# Patient Record
Sex: Female | Born: 1960 | Race: Black or African American | Hispanic: No | Marital: Single | Smoking: Former smoker
Health system: Southern US, Community
[De-identification: ages and names within clinical notes are randomized; demographics above are authoritative.]

## PROBLEM LIST (undated history)

## (undated) ENCOUNTER — Emergency Department: Admission: EM | Disposition: A | Discharge status: Home / Self Care

## (undated) DIAGNOSIS — K219 Gastro-esophageal reflux disease without esophagitis: Secondary | ICD-10-CM

## (undated) DIAGNOSIS — I714 Abdominal aortic aneurysm, without rupture, unspecified: Secondary | ICD-10-CM

## (undated) DIAGNOSIS — E559 Vitamin D deficiency, unspecified: Secondary | ICD-10-CM

## (undated) DIAGNOSIS — E785 Hyperlipidemia, unspecified: Secondary | ICD-10-CM

## (undated) DIAGNOSIS — L98499 Non-pressure chronic ulcer of skin of other sites with unspecified severity: Secondary | ICD-10-CM

## (undated) DIAGNOSIS — M069 Rheumatoid arthritis, unspecified: Secondary | ICD-10-CM

## (undated) DIAGNOSIS — I671 Cerebral aneurysm, nonruptured: Secondary | ICD-10-CM

## (undated) DIAGNOSIS — E041 Nontoxic single thyroid nodule: Secondary | ICD-10-CM

## (undated) DIAGNOSIS — I10 Essential (primary) hypertension: Secondary | ICD-10-CM

## (undated) DIAGNOSIS — E119 Type 2 diabetes mellitus without complications: Secondary | ICD-10-CM

## (undated) DIAGNOSIS — E0590 Thyrotoxicosis, unspecified without thyrotoxic crisis or storm: Secondary | ICD-10-CM

## (undated) DIAGNOSIS — L98439 Non-pressure chronic ulcer of abdomen with unspecified severity: Secondary | ICD-10-CM

## (undated) HISTORY — PX: COLONOSCOPY: SHX174

## (undated) HISTORY — DX: Hyperlipidemia, unspecified: E78.5

## (undated) HISTORY — PX: OTHER SURGICAL HISTORY: SHX169

## (undated) HISTORY — DX: Abdominal aortic aneurysm, without rupture, unspecified: I71.40

---

## 1997-09-23 DIAGNOSIS — I671 Cerebral aneurysm, nonruptured: Secondary | ICD-10-CM

## 1997-09-23 HISTORY — DX: Cerebral aneurysm, nonruptured: I67.1

## 1997-11-27 HISTORY — PX: BRAIN SURGERY: SHX531

## 2000-09-23 DIAGNOSIS — M069 Rheumatoid arthritis, unspecified: Secondary | ICD-10-CM

## 2000-09-23 HISTORY — DX: Rheumatoid arthritis, unspecified: M06.9

## 2003-09-24 HISTORY — PX: ABDOMINAL HYSTERECTOMY: SHX81

## 2004-07-22 ENCOUNTER — Emergency Department: Payer: Self-pay | Admitting: Emergency Medicine

## 2005-09-12 ENCOUNTER — Emergency Department: Payer: Self-pay | Admitting: Emergency Medicine

## 2005-11-06 ENCOUNTER — Emergency Department: Payer: Self-pay | Admitting: Emergency Medicine

## 2006-02-07 ENCOUNTER — Ambulatory Visit: Payer: Self-pay | Admitting: Nurse Practitioner

## 2006-03-07 ENCOUNTER — Ambulatory Visit: Payer: Self-pay | Admitting: Unknown Physician Specialty

## 2006-03-07 ENCOUNTER — Other Ambulatory Visit: Payer: Self-pay

## 2006-03-13 ENCOUNTER — Ambulatory Visit: Payer: Self-pay | Admitting: Unknown Physician Specialty

## 2006-05-28 ENCOUNTER — Ambulatory Visit: Payer: Self-pay | Admitting: Nurse Practitioner

## 2006-07-03 ENCOUNTER — Ambulatory Visit: Payer: Self-pay | Admitting: Pediatrics

## 2006-08-07 ENCOUNTER — Other Ambulatory Visit: Payer: Self-pay

## 2006-08-07 ENCOUNTER — Inpatient Hospital Stay: Payer: Self-pay | Admitting: Internal Medicine

## 2007-01-05 ENCOUNTER — Emergency Department: Payer: Self-pay | Admitting: Emergency Medicine

## 2007-04-02 ENCOUNTER — Emergency Department: Payer: Self-pay

## 2007-04-02 ENCOUNTER — Other Ambulatory Visit: Payer: Self-pay

## 2007-08-02 ENCOUNTER — Emergency Department: Payer: Self-pay | Admitting: Emergency Medicine

## 2007-10-02 ENCOUNTER — Ambulatory Visit: Payer: Self-pay | Admitting: General Surgery

## 2007-10-02 ENCOUNTER — Other Ambulatory Visit: Payer: Self-pay

## 2007-10-09 ENCOUNTER — Ambulatory Visit: Payer: Self-pay | Admitting: General Surgery

## 2007-12-05 ENCOUNTER — Emergency Department: Payer: Self-pay | Admitting: Emergency Medicine

## 2008-01-11 ENCOUNTER — Observation Stay: Payer: Self-pay | Admitting: Internal Medicine

## 2008-01-11 ENCOUNTER — Other Ambulatory Visit: Payer: Self-pay

## 2008-03-03 ENCOUNTER — Ambulatory Visit: Payer: Self-pay | Admitting: Physician Assistant

## 2008-03-15 ENCOUNTER — Ambulatory Visit: Payer: Self-pay | Admitting: Physician Assistant

## 2008-03-27 ENCOUNTER — Emergency Department: Payer: Self-pay | Admitting: Emergency Medicine

## 2008-09-13 ENCOUNTER — Ambulatory Visit: Payer: Self-pay | Admitting: Internal Medicine

## 2009-01-13 ENCOUNTER — Ambulatory Visit: Payer: Self-pay | Admitting: Internal Medicine

## 2009-01-18 ENCOUNTER — Ambulatory Visit: Payer: Self-pay | Admitting: Internal Medicine

## 2009-03-29 ENCOUNTER — Ambulatory Visit: Payer: Self-pay | Admitting: Unknown Physician Specialty

## 2009-04-03 ENCOUNTER — Ambulatory Visit: Payer: Self-pay | Admitting: Unknown Physician Specialty

## 2009-07-02 ENCOUNTER — Emergency Department: Payer: Self-pay | Admitting: Emergency Medicine

## 2009-07-06 ENCOUNTER — Ambulatory Visit: Payer: Self-pay | Admitting: Internal Medicine

## 2009-11-27 ENCOUNTER — Ambulatory Visit: Payer: Self-pay | Admitting: Unknown Physician Specialty

## 2010-12-22 ENCOUNTER — Inpatient Hospital Stay: Payer: Self-pay | Admitting: Internal Medicine

## 2011-02-13 ENCOUNTER — Ambulatory Visit: Payer: Self-pay | Admitting: Unknown Physician Specialty

## 2011-02-14 LAB — PATHOLOGY REPORT

## 2011-05-13 ENCOUNTER — Emergency Department: Payer: Self-pay | Admitting: *Deleted

## 2011-07-05 ENCOUNTER — Ambulatory Visit: Payer: Self-pay | Admitting: Internal Medicine

## 2011-10-11 LAB — HM COLONOSCOPY

## 2012-03-07 ENCOUNTER — Emergency Department: Payer: Self-pay | Admitting: Emergency Medicine

## 2012-03-08 ENCOUNTER — Emergency Department: Payer: Self-pay | Admitting: Emergency Medicine

## 2012-04-13 ENCOUNTER — Emergency Department: Payer: Self-pay | Admitting: Unknown Physician Specialty

## 2012-04-13 LAB — URINALYSIS, COMPLETE
Bacteria: NONE SEEN
Bilirubin,UR: NEGATIVE
Ketone: NEGATIVE
Leukocyte Esterase: NEGATIVE
Protein: NEGATIVE
RBC,UR: 10 /HPF (ref 0–5)
Squamous Epithelial: <1
WBC UR: 1 /HPF (ref 0–5)

## 2012-04-13 LAB — BASIC METABOLIC PANEL
Anion Gap: 5 — ABNORMAL LOW (ref 7–16)
BUN: 13 mg/dL (ref 7–18)
Calcium, Total: 9.6 mg/dL (ref 8.5–10.1)
Co2: 29 mmol/L (ref 21–32)
EGFR (African American): >60
EGFR (Non-African Amer.): >60
Glucose: 94 mg/dL (ref 65–99)
Osmolality: 279 (ref 275–301)
Potassium: 4.4 mmol/L (ref 3.5–5.1)
Sodium: 140 mmol/L (ref 136–145)

## 2012-04-13 LAB — CBC
HCT: 40.4 % (ref 35.0–47.0)
HGB: 12.9 g/dL (ref 12.0–16.0)
MCHC: 32 g/dL (ref 32.0–36.0)
MCV: 93 fL (ref 80–100)
RDW: 13.8 % (ref 11.5–14.5)

## 2013-07-05 ENCOUNTER — Emergency Department: Payer: Self-pay | Admitting: Emergency Medicine

## 2013-07-05 LAB — URINALYSIS, COMPLETE
Bilirubin,UR: NEGATIVE
Glucose,UR: NEGATIVE mg/dL (ref 0–75)
Leukocyte Esterase: NEGATIVE
Nitrite: NEGATIVE
Ph: 8 (ref 4.5–8.0)
RBC,UR: 4 /HPF (ref 0–5)
Squamous Epithelial: <1
WBC UR: <1 /HPF (ref 0–5)

## 2013-07-05 LAB — COMPREHENSIVE METABOLIC PANEL
Albumin: 4.1 g/dL (ref 3.4–5.0)
Alkaline Phosphatase: 108 U/L (ref 50–136)
BUN: 10 mg/dL (ref 7–18)
Calcium, Total: 9.4 mg/dL (ref 8.5–10.1)
Co2: 27 mmol/L (ref 21–32)
Potassium: 4.1 mmol/L (ref 3.5–5.1)
SGOT(AST): 20 U/L (ref 15–37)
SGPT (ALT): 12 U/L (ref 12–78)
Sodium: 135 mmol/L — ABNORMAL LOW (ref 136–145)

## 2013-07-05 LAB — CBC
HCT: 43.5 % (ref 35.0–47.0)
MCH: 30.5 pg (ref 26.0–34.0)
MCHC: 33.7 g/dL (ref 32.0–36.0)
Platelet: 295 10*3/uL (ref 150–440)
RDW: 13.6 % (ref 11.5–14.5)

## 2013-07-06 ENCOUNTER — Emergency Department: Payer: Self-pay | Admitting: Emergency Medicine

## 2013-07-08 ENCOUNTER — Emergency Department: Payer: Self-pay | Admitting: Emergency Medicine

## 2013-07-13 ENCOUNTER — Encounter (HOSPITAL_COMMUNITY): Payer: Self-pay | Admitting: Emergency Medicine

## 2013-07-13 ENCOUNTER — Emergency Department: Payer: Self-pay | Admitting: Emergency Medicine

## 2013-07-13 DIAGNOSIS — M546 Pain in thoracic spine: Secondary | ICD-10-CM | POA: Insufficient documentation

## 2013-07-13 DIAGNOSIS — I10 Essential (primary) hypertension: Secondary | ICD-10-CM | POA: Insufficient documentation

## 2013-07-13 DIAGNOSIS — M069 Rheumatoid arthritis, unspecified: Secondary | ICD-10-CM | POA: Insufficient documentation

## 2013-07-13 DIAGNOSIS — Z8679 Personal history of other diseases of the circulatory system: Secondary | ICD-10-CM | POA: Insufficient documentation

## 2013-07-13 NOTE — ED Notes (Signed)
Presents with one week of left sided upper back pain described as a toothache and worse with movement and touch, pain is affecting daily life despite use of muscle relaxers and pain medication. Seen at [facility] Monday and given CT, EKG,  and multiple labwork, everything was normal. Despite use of medications pain is worsening and isolated to one small spot in left upper back.  Pain medication used is tramadol.

## 2013-07-13 NOTE — ED Notes (Signed)
Nurse First Rounds : Unable to locate pt. at waiting area.

## 2013-07-14 ENCOUNTER — Emergency Department (HOSPITAL_COMMUNITY)
Admission: EM | Admit: 2013-07-14 | Discharge: 2013-07-14 | Discharge status: Against Medical Advice | Payer: Medicare Other | Attending: Emergency Medicine | Admitting: Emergency Medicine

## 2013-07-14 HISTORY — DX: Rheumatoid arthritis, unspecified: M06.9

## 2013-07-14 HISTORY — DX: Non-pressure chronic ulcer of skin of other sites with unspecified severity: L98.499

## 2013-07-14 HISTORY — DX: Essential (primary) hypertension: I10

## 2013-07-14 HISTORY — DX: Non-pressure chronic ulcer of abdomen with unspecified severity: L98.439

## 2013-07-14 HISTORY — DX: Cerebral aneurysm, nonruptured: I67.1

## 2013-07-14 LAB — CBC
HCT: 46.6 % (ref 35.0–47.0)
HGB: 15.7 g/dL (ref 12.0–16.0)
MCH: 30.6 pg (ref 26.0–34.0)
Platelet: 310 10*3/uL (ref 150–440)
WBC: 6.8 10*3/uL (ref 3.6–11.0)

## 2013-07-14 LAB — URINALYSIS, COMPLETE
Bilirubin,UR: NEGATIVE
Blood: NEGATIVE
Glucose,UR: NEGATIVE mg/dL (ref 0–75)
Hyaline Cast: 35
Nitrite: NEGATIVE
Ph: 5 (ref 4.5–8.0)
RBC,UR: 12 /HPF (ref 0–5)
Specific Gravity: 1.025 (ref 1.003–1.030)
Squamous Epithelial: <1
WBC UR: 2 /HPF (ref 0–5)

## 2013-07-14 LAB — COMPREHENSIVE METABOLIC PANEL
Albumin: 4.3 g/dL (ref 3.4–5.0)
Calcium, Total: 9.8 mg/dL (ref 8.5–10.1)
Co2: 28 mmol/L (ref 21–32)
Creatinine: 0.97 mg/dL (ref 0.60–1.30)
EGFR (African American): >60
EGFR (Non-African Amer.): >60
Glucose: 116 mg/dL — ABNORMAL HIGH (ref 65–99)
Potassium: 4.1 mmol/L (ref 3.5–5.1)
Sodium: 132 mmol/L — ABNORMAL LOW (ref 136–145)

## 2013-07-14 LAB — TROPONIN I: Troponin-I: <0.02 ng/mL

## 2013-07-14 LAB — CK TOTAL AND CKMB (NOT AT ARMC): CK-MB: 1.1 ng/mL (ref 0.5–3.6)

## 2013-07-14 NOTE — ED Notes (Signed)
Called patient twice w/o answer

## 2014-04-26 ENCOUNTER — Ambulatory Visit (INDEPENDENT_AMBULATORY_CARE_PROVIDER_SITE_OTHER): Payer: Medicare Other | Admitting: Adult Health

## 2014-04-26 ENCOUNTER — Encounter: Payer: Self-pay | Admitting: Adult Health

## 2014-04-26 VITALS — BP 157/100 | HR 86 | Temp 98.0°F | Resp 14 | Ht 64.5 in | Wt 121.5 lb

## 2014-04-26 DIAGNOSIS — R05 Cough: Secondary | ICD-10-CM

## 2014-04-26 DIAGNOSIS — Z1239 Encounter for other screening for malignant neoplasm of breast: Secondary | ICD-10-CM

## 2014-04-26 DIAGNOSIS — E785 Hyperlipidemia, unspecified: Secondary | ICD-10-CM

## 2014-04-26 DIAGNOSIS — R059 Cough, unspecified: Secondary | ICD-10-CM

## 2014-04-26 DIAGNOSIS — R5383 Other fatigue: Secondary | ICD-10-CM

## 2014-04-26 DIAGNOSIS — I10 Essential (primary) hypertension: Secondary | ICD-10-CM

## 2014-04-26 DIAGNOSIS — R5381 Other malaise: Secondary | ICD-10-CM

## 2014-04-26 MED ORDER — MIRTAZAPINE 30 MG PO TABS: 30.0000 mg | ORAL_TABLET | Freq: Every day | ORAL | Status: DC

## 2014-04-26 MED ORDER — HYDROCHLOROTHIAZIDE 25 MG PO TABS: 25.0000 mg | ORAL_TABLET | Freq: Every day | ORAL | Status: DC

## 2014-04-26 MED ORDER — FLUTICASONE PROPIONATE 50 MCG/ACT NA SUSP: 2.0000 | Freq: Every day | NASAL | Status: AC

## 2014-04-26 MED ORDER — OMEPRAZOLE 40 MG PO CPDR: 40.0000 mg | DELAYED_RELEASE_CAPSULE | Freq: Every day | ORAL | Status: DC

## 2014-04-26 MED ORDER — LISINOPRIL 20 MG PO TABS: 40.0000 mg | ORAL_TABLET | Freq: Every day | ORAL | Status: DC

## 2014-04-26 NOTE — Progress Notes (Signed)
Patient ID: Brooke Murphy, female   DOB: 08/25/1961, 53 y.o.   MRN: [id number]   Subjective:    Patient ID: Brooke Murphy, female    DOB: 12/01/1960, 53 y.o.   MRN: [id number]  HPI  Pt is a pleasant 53 y/o female who presents to clinic to establish care. Previously followed at [facility] in [place]. Hx of HTN, HLD and has been out of her medications for ~ 1.5 months. Blood pressure is elevated in clinic today. 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 Occasional fatigue. Reports cough for the past 3 weeks. Feels sinus pressure. Cough worse when lies down. Take omeprazole 40 mg daily.    Past Medical History  Diagnosis Date  . Brain aneurysm 1999    [facility]  . Hypertension   . Ulcer of abdomen wall   . Rheumatoid arthritis 2002    s/p methotrexate, humira and embrel  . Hyperlipidemia      Past Surgical History  Procedure Laterality Date  . Abdominal hysterectomy  2005    ovaries remain  . Brain surgery Left 11/27/1997    aneurysm     Family History  Problem Relation Age of Onset  . Hypertension Mother   . Aneurysm Mother 51    died  . Hyperlipidemia Sister   . Hypertension Sister   . Diabetes Maternal Aunt   . Hypertension Maternal Grandmother   . Stroke Maternal Grandmother   . Cancer Father      History   Social History  . Marital Status: Single    Spouse Name: N/A    Number of Children: N/A  . Years of Education: N/A   Occupational History  . Not on file.   Social History Main Topics  . Smoking status: Current Some Day Smoker  . Smokeless tobacco: Never Used  . Alcohol Use: No  . Drug Use: No  . Sexual Activity: Not on file   Other Topics Concern  . Not on file   Social History Narrative  . No narrative on file    Mammogram - needs Colonoscopy - 2014 Dr. Vira Agar Tetanus - 2014  Review of Systems  Constitutional: Positive for fatigue (mild, intermittent).  HENT: Negative.   Eyes: Negative.   Respiratory: Positive for cough.    Cough started ~ 3 weeks ago. She has not been taking lisinopril for 1.5 months.  Cardiovascular: Negative.   Gastrointestinal: Negative.   Endocrine: Negative.   Genitourinary: Negative.   Musculoskeletal: Negative.   Skin: Negative.   Allergic/Immunologic: Negative.   Neurological: Negative.   Hematological: Negative.   Psychiatric/Behavioral: Negative.        Objective:  BP 157/100  Pulse 86  Temp(Src) 98 F (36.7 C) (Oral)  Resp 14  Ht 5' 4.5" (1.638 m)  Wt 121 lb 8 oz (55.112 kg)  BMI 20.54 kg/m2  SpO2 100%   Physical Exam  Constitutional: She is oriented to person, place, and time. She appears well-developed and well-nourished. No distress.  HENT:  Head: Normocephalic and atraumatic.  Right Ear: External ear normal.  Left Ear: External ear normal.  Nose: Nose normal.  Mouth/Throat: Oropharynx is clear and moist.  Eyes: Conjunctivae and EOM are normal.  Neck: Normal range of motion. Neck supple. No tracheal deviation present.  Cardiovascular: Normal rate, regular rhythm, normal heart sounds and intact distal pulses.  Exam reveals no gallop and no friction rub.   No murmur heard. Pulmonary/Chest: Effort normal and breath sounds normal. No respiratory distress. She has  no wheezes. She has no rales.  Musculoskeletal: Normal range of motion.  Neurological: She is alert and oriented to person, place, and time. She has normal reflexes. No cranial nerve deficit.  Skin: Skin is warm and dry.  Psychiatric: She has a normal mood and affect. Her behavior is normal. Judgment and thought content normal.      Assessment & Plan:   1. Screening for breast cancer Schedule mammogram before leaving office today - MM DIGITAL SCREENING BILATERAL; Future  2. Essential hypertension Refill meds - lisinopril and HCTZ. Monitor b/p daily. RTC for f/u in 1 month.  - Comprehensive metabolic panel; Future - CBC with Differential; Future  3. HLD (hyperlipidemia) Has been off medication  for a while. Reports last blood work her previous PCP took her off Pravachol. Check labs and follow. - Lipid panel; Future  4. Other fatigue Check labs. Follow. - TSH; Future - Vit D  25 hydroxy (rtn osteoporosis monitoring); Future - Vitamin B12; Future  5. Cough Flonase 2 sprays in each nostril Dimetapp Elixir OTC cough med. RTC if no improvement with 1 week.

## 2014-04-26 NOTE — Progress Notes (Signed)
Pre visit review using our clinic review tool, if applicable. No additional management support is needed unless otherwise documented below in the visit note. 

## 2014-04-26 NOTE — Patient Instructions (Addendum)
   Please see Hoyle Sauer to schedule your Mammogram.  Return to clinic for fasting labs. You need a lab appointment which can be done at the [facility]ception[facility].  I have sent in a prescription for your blood pressure. Start back on this medication as soon as you pick it up.  Please check your blood pressure daily and record. If not well controlled we may need to add another medication.  Start Flonase 2 sprays into each nostril daily.  Use over the counter Children's Dimetapp Elixir as a decongestant. This is very mild.  You can also try an over the counter cough syrup with guaifenesin and dextromethorphan.

## 2014-04-27 ENCOUNTER — Telehealth: Payer: Self-pay | Admitting: Adult Health

## 2014-04-27 NOTE — Telephone Encounter (Signed)
Relevant patient education mailed to patient.  

## 2014-04-30 ENCOUNTER — Emergency Department: Payer: Self-pay | Admitting: Emergency Medicine

## 2014-05-13 ENCOUNTER — Other Ambulatory Visit (INDEPENDENT_AMBULATORY_CARE_PROVIDER_SITE_OTHER): Payer: BC Managed Care – PPO

## 2014-05-13 DIAGNOSIS — E785 Hyperlipidemia, unspecified: Secondary | ICD-10-CM

## 2014-05-13 DIAGNOSIS — R5383 Other fatigue: Secondary | ICD-10-CM

## 2014-05-13 DIAGNOSIS — I10 Essential (primary) hypertension: Secondary | ICD-10-CM

## 2014-05-27 ENCOUNTER — Ambulatory Visit: Payer: Medicare Other | Admitting: Adult Health

## 2014-06-03 ENCOUNTER — Ambulatory Visit: Payer: Medicare Other | Admitting: Adult Health

## 2014-06-03 DIAGNOSIS — Z0289 Encounter for other administrative examinations: Secondary | ICD-10-CM

## 2014-06-10 ENCOUNTER — Encounter: Payer: Self-pay | Admitting: Adult Health

## 2014-06-10 ENCOUNTER — Ambulatory Visit (INDEPENDENT_AMBULATORY_CARE_PROVIDER_SITE_OTHER): Payer: BC Managed Care – PPO | Admitting: Adult Health

## 2014-06-10 VITALS — BP 153/95 | HR 77 | Temp 97.9°F | Resp 14 | Ht 64.5 in | Wt 128.5 lb

## 2014-06-10 DIAGNOSIS — Z13820 Encounter for screening for osteoporosis: Secondary | ICD-10-CM

## 2014-06-10 DIAGNOSIS — I10 Essential (primary) hypertension: Secondary | ICD-10-CM

## 2014-06-10 MED ORDER — HYDROCHLOROTHIAZIDE 25 MG PO TABS: 25.0000 mg | ORAL_TABLET | Freq: Every day | ORAL | Status: DC

## 2014-06-10 MED ORDER — LISINOPRIL 40 MG PO TABS: 40.0000 mg | ORAL_TABLET | Freq: Every day | ORAL | Status: DC

## 2014-06-10 NOTE — Progress Notes (Signed)
Patient ID: Brooke Murphy, female   DOB: 03/26/1961, 53 y.o.   MRN: [id number]   Subjective:    Patient ID: Brooke Murphy, female    DOB: 1961/08/04, 53 y.o.   MRN: [id number]  HPI  Pt is a pleasant 53 y/o female who presents to clinic for HTN follow up. She reports that at home her blood pressure runs systolic in the 332R and diastolic in the 51O. She reports that she had run out of her medication that is why her blood pressure is elevated today.   Needs bone density. Will schedule during clinic today.  Does not want a flu vaccine. Had bad experience and has never got one again.  Past Medical History  Diagnosis Date  . Brain aneurysm 1999    [facility]  . Hypertension   . Ulcer of abdomen wall   . Rheumatoid arthritis 2002    s/p methotrexate, humira and embrel  . Hyperlipidemia     Current Outpatient Prescriptions on File Prior to Visit  Medication Sig Dispense Refill  . aspirin EC 81 MG tablet Take 81 mg by mouth daily.      . bimatoprost (LUMIGAN) 0.01 % SOLN Place 1 drop into both eyes at bedtime.      . fluticasone (FLONASE) 50 MCG/ACT nasal spray Place 2 sprays into both nostrils daily.  16 g  6  . hydrochlorothiazide (HYDRODIURIL) 25 MG tablet Take 1 tablet (25 mg total) by mouth daily.  30 tablet  5  . lisinopril (PRINIVIL,ZESTRIL) 20 MG tablet Take 2 tablets (40 mg total) by mouth daily.  30 tablet  5  . mirtazapine (REMERON) 30 MG tablet Take 1 tablet (30 mg total) by mouth at bedtime.  30 tablet  5  . omeprazole (PRILOSEC) 40 MG capsule Take 1 capsule (40 mg total) by mouth daily.  30 capsule  5   No current facility-administered medications on file prior to visit.     Review of Systems  Constitutional: Negative.   HENT: Negative.   Eyes: Negative.   Respiratory: Negative.   Cardiovascular: Negative.   Gastrointestinal: Negative.   Endocrine: Negative.   Genitourinary: Negative.   Musculoskeletal: Negative.   Skin: Negative.   Allergic/Immunologic: Negative.     Neurological: Negative.   Hematological: Negative.   Psychiatric/Behavioral: Negative.        Objective:  BP 153/95  Pulse 77  Temp(Src) 97.9 F (36.6 C) (Oral)  Resp 14  Ht 5' 4.5" (1.638 m)  Wt 128 lb 8 oz (58.287 kg)  BMI 21.72 kg/m2  SpO2 100%   Physical Exam  Constitutional: She is oriented to person, place, and time. No distress.  HENT:  Head: Normocephalic and atraumatic.  Eyes: Conjunctivae and EOM are normal.  Neck: Normal range of motion. Neck supple.  Cardiovascular: Normal rate, regular rhythm, normal heart sounds and intact distal pulses.  Exam reveals no gallop and no friction rub.   No murmur heard. Pulmonary/Chest: Effort normal and breath sounds normal. No respiratory distress. She has no wheezes. She has no rales.  Musculoskeletal: Normal range of motion.  Neurological: She is alert and oriented to person, place, and time. She has normal reflexes. Coordination normal.  Skin: Skin is warm and dry.  Psychiatric: She has a normal mood and affect. Her behavior is normal. Judgment and thought content normal.       Assessment & Plan:   1. Essential hypertension Refills on medications sent to pharmacy. Monitors at home and well controlled. Elevated today because  had run out of meds. Follow.   2. Screening for osteoporosis Schedule with Hoyle Sauer today. - DG Bone Density; Future

## 2014-06-10 NOTE — Patient Instructions (Signed)
  I have sent in refills for your medication to your pharmacy.  Please see Hoyle Sauer in Referrals to schedule your Bone Density prior to leaving.  Remember to reschedule your Mammogram if you have not already done so.  Return for follow up appointment in 6 months or sooner if necessary.

## 2014-06-10 NOTE — Progress Notes (Signed)
Pre visit review using our clinic review tool, if applicable. No additional management support is needed unless otherwise documented below in the visit note. 

## 2014-10-11 ENCOUNTER — Emergency Department: Payer: Self-pay | Admitting: Emergency Medicine

## 2014-10-11 LAB — CBC WITH DIFFERENTIAL/PLATELET
BASOS ABS: 0.1 10*3/uL (ref 0.0–0.1)
BASOS PCT: 1 %
EOS ABS: 0 10*3/uL (ref 0.0–0.7)
Eosinophil %: 0.5 %
HCT: 48.1 % — ABNORMAL HIGH (ref 35.0–47.0)
HGB: 15.5 g/dL (ref 12.0–16.0)
LYMPHS PCT: 40.1 %
Lymphocyte #: 2.9 10*3/uL (ref 1.0–3.6)
MCH: 29.7 pg (ref 26.0–34.0)
MCHC: 32.2 g/dL (ref 32.0–36.0)
MCV: 92 fL (ref 80–100)
MONO ABS: 0.5 x10 3/mm (ref 0.2–0.9)
Monocyte %: 6.9 %
Neutrophil #: 3.7 10*3/uL (ref 1.4–6.5)
Neutrophil %: 51.5 %
PLATELETS: 324 10*3/uL (ref 150–440)
RBC: 5.22 10*6/uL — AB (ref 3.80–5.20)
RDW: 14.4 % (ref 11.5–14.5)
WBC: 7.2 10*3/uL (ref 3.6–11.0)

## 2014-10-11 LAB — URINALYSIS, COMPLETE
BACTERIA: NONE SEEN
BILIRUBIN, UR: NEGATIVE
Glucose,UR: NEGATIVE mg/dL (ref 0–75)
Ketone: NEGATIVE
Leukocyte Esterase: NEGATIVE
Nitrite: NEGATIVE
PROTEIN: NEGATIVE
Ph: 6 (ref 4.5–8.0)
RBC,UR: <1 /HPF (ref 0–5)
Specific Gravity: 1.003 (ref 1.003–1.030)
WBC UR: NONE SEEN /HPF (ref 0–5)

## 2014-10-11 LAB — BASIC METABOLIC PANEL
Anion Gap: 5 — ABNORMAL LOW (ref 7–16)
BUN: 8 mg/dL (ref 7–18)
CALCIUM: 9.6 mg/dL (ref 8.5–10.1)
CO2: 32 mmol/L (ref 21–32)
Chloride: 98 mmol/L (ref 98–107)
Creatinine: 0.84 mg/dL (ref 0.60–1.30)
EGFR (African American): >60
EGFR (Non-African Amer.): >60
Glucose: 88 mg/dL (ref 65–99)
Osmolality: 268 (ref 275–301)
Potassium: 3.9 mmol/L (ref 3.5–5.1)
Sodium: 135 mmol/L — ABNORMAL LOW (ref 136–145)

## 2014-10-11 LAB — TROPONIN I

## 2014-10-12 ENCOUNTER — Emergency Department: Payer: Self-pay | Admitting: Emergency Medicine

## 2014-10-12 LAB — TROPONIN I

## 2014-10-12 LAB — D-DIMER(ARMC): D-Dimer: 1499 ng/ml

## 2014-10-14 ENCOUNTER — Ambulatory Visit: Payer: Self-pay | Admitting: Nurse Practitioner

## 2014-10-21 ENCOUNTER — Ambulatory Visit: Payer: Self-pay | Admitting: Nurse Practitioner

## 2014-11-02 ENCOUNTER — Ambulatory Visit: Payer: Self-pay | Admitting: Nurse Practitioner

## 2014-11-28 ENCOUNTER — Ambulatory Visit: Payer: Self-pay | Admitting: Nurse Practitioner

## 2014-12-01 ENCOUNTER — Other Ambulatory Visit: Payer: Self-pay | Admitting: *Deleted

## 2014-12-01 MED ORDER — OMEPRAZOLE 40 MG PO CPDR: 40.0000 mg | DELAYED_RELEASE_CAPSULE | Freq: Every day | ORAL | Status: DC

## 2014-12-05 ENCOUNTER — Ambulatory Visit (INDEPENDENT_AMBULATORY_CARE_PROVIDER_SITE_OTHER): Payer: Medicare Other | Admitting: Nurse Practitioner

## 2014-12-05 ENCOUNTER — Encounter: Payer: Self-pay | Admitting: Nurse Practitioner

## 2014-12-05 VITALS — BP 130/70 | HR 115 | Temp 98.1°F | Resp 14 | Ht 64.5 in | Wt 145.8 lb

## 2014-12-05 DIAGNOSIS — I10 Essential (primary) hypertension: Secondary | ICD-10-CM

## 2014-12-05 DIAGNOSIS — J309 Allergic rhinitis, unspecified: Secondary | ICD-10-CM

## 2014-12-05 DIAGNOSIS — M546 Pain in thoracic spine: Secondary | ICD-10-CM

## 2014-12-05 MED ORDER — CYCLOBENZAPRINE HCL 10 MG PO TABS: 10.0000 mg | ORAL_TABLET | Freq: Three times a day (TID) | ORAL | Status: DC | PRN

## 2014-12-05 MED ORDER — METOPROLOL TARTRATE 25 MG PO TABS: 25.0000 mg | ORAL_TABLET | Freq: Two times a day (BID) | ORAL | Status: DC

## 2014-12-05 NOTE — Progress Notes (Signed)
Subjective:    Patient ID: Brooke Murphy, female    DOB: 10/13/1960, 54 y.o.   MRN: [id number]  HPI  Brooke Murphy is a 54 yo female with a CC of ER follow up from Jan.   1) [facility]-   Dizziness, headache, she was concerned about aneurysm since she had a burst aneurysm in the past.   She reports CT of head was normal   Went to a urgent care- prednisone helped with fluid in ear  2) RA diagnosed in 2002 Went to Chiropractor  midback pain bilateral, throbbing, ice- somewhat helpful    3) Metoprolol 25 mg been out since last week. She did not ask for a refill when running out and wanted to wait until today.    Review of Systems  Constitutional: Negative for fever, chills, diaphoresis and fatigue.  Respiratory: Negative for chest tightness, shortness of breath and wheezing.   Cardiovascular: Negative for chest pain, palpitations and leg swelling.  Gastrointestinal: Negative for nausea, vomiting and diarrhea.  Skin: Negative for rash.  Neurological: Negative for dizziness, weakness, numbness and headaches.  Psychiatric/Behavioral: The patient is not nervous/anxious.    Past Medical History  Diagnosis Date  . Brain aneurysm 1999    [facility]  . Hypertension   . Ulcer of abdomen wall   . Rheumatoid arthritis 2002    s/p methotrexate, humira and embrel  . Hyperlipidemia     History   Social History  . Marital Status: Divorced    Spouse Name: N/A  . Number of Children: N/A  . Years of Education: N/A   Occupational History  . Not on file.   Social History Main Topics  . Smoking status: Current Some Day Smoker  . Smokeless tobacco: Never Used  . Alcohol Use: No  . Drug Use: No  . Sexual Activity: Not on file   Other Topics Concern  . Not on file   Social History Narrative    Past Surgical History  Procedure Laterality Date  . Abdominal hysterectomy  2005    ovaries remain  . Brain surgery Left 11/27/1997    aneurysm    Family History  Problem Relation Age of Onset  .  Hypertension Mother   . Aneurysm Mother 53    died  . Hyperlipidemia Sister   . Hypertension Sister   . Diabetes Maternal Aunt   . Hypertension Maternal Grandmother   . Stroke Maternal Grandmother   . Cancer Father     Allergies  Allergen Reactions  . Celebrex [Celecoxib]   . Lodine [Etodolac]   . Penicillins     Current Outpatient Prescriptions on File Prior to Visit  Medication Sig Dispense Refill  . aspirin EC 81 MG tablet Take 81 mg by mouth daily.    . bimatoprost (LUMIGAN) 0.01 % SOLN Place 1 drop into both eyes at bedtime.    . fluticasone (FLONASE) 50 MCG/ACT nasal spray Place 2 sprays into both nostrils daily. 16 g 6  . hydrochlorothiazide (HYDRODIURIL) 25 MG tablet Take 1 tablet (25 mg total) by mouth daily. 30 tablet 6  . lisinopril (PRINIVIL,ZESTRIL) 40 MG tablet Take 1 tablet (40 mg total) by mouth daily. 30 tablet 6  . mirtazapine (REMERON) 30 MG tablet Take 1 tablet (30 mg total) by mouth at bedtime. 30 tablet 5  . omeprazole (PRILOSEC) 40 MG capsule Take 1 capsule (40 mg total) by mouth daily. 30 capsule 5   No current facility-administered medications on file prior to visit.  Objective:   Physical Exam  Constitutional: She is oriented to person, place, and time. She appears well-developed and well-nourished. No distress.  BP 130/70 mmHg  Pulse 115  Temp(Src) 98.1 F (36.7 C) (Oral)  Resp 14  Ht 5' 4.5" (1.638 m)  Wt 145 lb 12.8 oz (66.134 kg)  BMI 24.65 kg/m2  SpO2 96% Repeat pulse 110  HENT:  Head: Normocephalic and atraumatic.  Right Ear: External ear normal.  Left Ear: External ear normal.  Eyes: Right eye exhibits no discharge. Left eye exhibits no discharge. No scleral icterus.  Neck: Normal range of motion. Neck supple. No thyromegaly present.  Cardiovascular: Normal rate, regular rhythm, normal heart sounds and intact distal pulses.  Exam reveals no gallop and no friction rub.   No murmur heard. Pulmonary/Chest: Effort normal and breath  sounds normal. No respiratory distress. She has no wheezes. She has no rales. She exhibits no tenderness.  Musculoskeletal: She exhibits tenderness. She exhibits no edema.  Thoracic paraspinal muscle tenderness  Lymphadenopathy:    She has no cervical adenopathy.  Neurological: She is alert and oriented to person, place, and time. No cranial nerve deficit. She exhibits normal muscle tone. Coordination normal.  Skin: Skin is warm and dry. No rash noted. She is not diaphoretic.  Psychiatric: She has a normal mood and affect. Her behavior is normal. Judgment and thought content normal.        Assessment & Plan:

## 2014-12-05 NOTE — Progress Notes (Signed)
Pre visit review using our clinic review tool, if applicable. No additional management support is needed unless otherwise documented below in the visit note. 

## 2014-12-05 NOTE — Progress Notes (Signed)
   Subjective:    Patient ID: Brooke Murphy, female    DOB: September 01, 1961, 54 y.o.   MRN: [id number]  HPI    Review of Systems     Objective:   Physical Exam        Assessment & Plan:

## 2014-12-05 NOTE — Patient Instructions (Addendum)
Follow up in 3 months for fasting lab work and a visit.   Nothing to eat or drink after midnight (black coffee and water okay).

## 2014-12-09 ENCOUNTER — Telehealth: Payer: Self-pay | Admitting: *Deleted

## 2014-12-09 NOTE — Telephone Encounter (Signed)
Liquid or pill benadryl at night for allergies. OTC

## 2014-12-09 NOTE — Telephone Encounter (Signed)
Pt called requesting a Rx for allergies to be taken along with her Flonase, states OTC meds are not effective.  Last OV 3.14.16.  Please advise.

## 2014-12-09 NOTE — Telephone Encounter (Signed)
Spoke with pt, advised of Carries message.  Pt verbalized understanding

## 2014-12-10 DIAGNOSIS — J309 Allergic rhinitis, unspecified: Secondary | ICD-10-CM | POA: Insufficient documentation

## 2014-12-10 DIAGNOSIS — M546 Pain in thoracic spine: Secondary | ICD-10-CM | POA: Insufficient documentation

## 2014-12-10 DIAGNOSIS — I10 Essential (primary) hypertension: Secondary | ICD-10-CM | POA: Insufficient documentation

## 2014-12-10 NOTE — Assessment & Plan Note (Signed)
Pt improved on prednisone and OTC allergy medications.

## 2014-12-10 NOTE — Assessment & Plan Note (Addendum)
Stretching, ice, and ibuprofen. Will follow

## 2014-12-10 NOTE — Addendum Note (Signed)
Addended by: Rubbie Battiest on: 12/10/2014 09:53 AM   Modules accepted: Level of Service

## 2014-12-10 NOTE — Assessment & Plan Note (Addendum)
Refilled Metoprolol. Asked pt not to stop it and to call the pharmacy before running out. BP okay today. Pulse 110.

## 2015-02-21 ENCOUNTER — Other Ambulatory Visit: Payer: Self-pay | Admitting: *Deleted

## 2015-02-21 MED ORDER — LISINOPRIL 40 MG PO TABS
40.0000 mg | ORAL_TABLET | Freq: Every day | ORAL | Status: DC
Start: 2015-02-21 — End: 2015-11-16

## 2015-03-07 ENCOUNTER — Ambulatory Visit: Payer: Medicare Other | Admitting: Nurse Practitioner

## 2015-03-08 ENCOUNTER — Telehealth: Payer: Self-pay

## 2015-03-08 NOTE — Telephone Encounter (Signed)
Patient no showed. Followed up with intent to reschedule visit. No answer. Left message with daughter.

## 2015-03-28 ENCOUNTER — Encounter: Payer: Self-pay | Admitting: Nurse Practitioner

## 2015-03-28 ENCOUNTER — Ambulatory Visit (INDEPENDENT_AMBULATORY_CARE_PROVIDER_SITE_OTHER): Payer: Medicare Other | Admitting: Nurse Practitioner

## 2015-03-28 DIAGNOSIS — M1990 Unspecified osteoarthritis, unspecified site: Secondary | ICD-10-CM

## 2015-03-28 DIAGNOSIS — I10 Essential (primary) hypertension: Secondary | ICD-10-CM

## 2015-03-28 LAB — BASIC METABOLIC PANEL
BUN: 12 mg/dL (ref 6–23)
CALCIUM: 9.5 mg/dL (ref 8.4–10.5)
CHLORIDE: 105 meq/L (ref 96–112)
CO2: 27 meq/L (ref 19–32)
Creatinine, Ser: 0.73 mg/dL (ref 0.40–1.20)
GFR: 106.19 mL/min (ref >60.00)
Glucose, Bld: 82 mg/dL (ref 70–99)
Potassium: 4.4 mEq/L (ref 3.5–5.1)
Sodium: 140 mEq/L (ref 135–145)

## 2015-03-28 LAB — LIPID PANEL
Cholesterol: 222 mg/dL — ABNORMAL HIGH (ref 0–200)
HDL: 41.9 mg/dL (ref >39.00)
LDL CALC: 144 mg/dL — AB (ref 0–99)
NonHDL: 180.1
TRIGLYCERIDES: 183 mg/dL — AB (ref 0.0–149.0)
Total CHOL/HDL Ratio: 5
VLDL: 36.6 mg/dL (ref 0.0–40.0)

## 2015-03-28 LAB — RHEUMATOID FACTOR: Rhuematoid fact SerPl-aCnc: <10 IU/mL (ref <=14)

## 2015-03-28 LAB — SEDIMENTATION RATE: Sed Rate: 24 mm/hr — ABNORMAL HIGH (ref 0–22)

## 2015-03-28 MED ORDER — HYDROCHLOROTHIAZIDE 25 MG PO TABS: 25.0000 mg | ORAL_TABLET | Freq: Every day | ORAL | Status: DC

## 2015-03-28 NOTE — Progress Notes (Signed)
   Subjective:    Patient ID: Brooke Murphy, female    DOB: May 09, 1961, 54 y.o.   MRN: [id number]  HPI  Brooke Murphy is a 54 yo female with a follow up of HTN.   1) HTN- Reports 136/74  Lisinopril, hctz and 1 metoprolol   2) Pt previously diagnosed with RA and seen by rheumatology, completed methotrexate therapy early on and prednisone. Pthas not had a flare in years, hands and left foot swollen and painful.   Review of Systems  Constitutional: Negative for fever, chills, diaphoresis and fatigue.  Respiratory: Negative for chest tightness, shortness of breath and wheezing.   Cardiovascular: Negative for chest pain, palpitations and leg swelling.  Gastrointestinal: Negative for nausea, vomiting, diarrhea and rectal pain.  Skin: Negative for rash.  Neurological: Negative for dizziness, weakness, numbness and headaches.  Psychiatric/Behavioral: The patient is not nervous/anxious.       Objective:   Physical Exam  Constitutional: She is oriented to person, place, and time. She appears well-developed and well-nourished. No distress.  BP 122/84 mmHg  Pulse 72  Temp(Src) 98 F (36.7 C)  Resp 16  Ht 5\' 5"  (1.651 m)  Wt 150 lb 6.4 oz (68.221 kg)  BMI 25.03 kg/m2  SpO2 97%   HENT:  Head: Normocephalic and atraumatic.  Right Ear: External ear normal.  Left Ear: External ear normal.  Cardiovascular: Normal rate, regular rhythm, normal heart sounds and intact distal pulses.  Exam reveals no gallop and no friction rub.   No murmur heard. Pulmonary/Chest: Effort normal and breath sounds normal. No respiratory distress. She has no wheezes. She has no rales. She exhibits no tenderness.  Neurological: She is alert and oriented to person, place, and time. No cranial nerve deficit. She exhibits normal muscle tone. Coordination normal.  Skin: Skin is warm and dry. No rash noted. She is not diaphoretic.  Psychiatric: She has a normal mood and affect. Her behavior is normal. Judgment and thought  content normal.      Assessment & Plan:

## 2015-03-28 NOTE — Patient Instructions (Addendum)
See you in 6 months!   Rheumatoid Arthritis Rheumatoid arthritis is a long-term (chronic) inflammatory disease that causes pain, swelling, and stiffness of the joints. It can affect the entire body, including the eyes and lungs. The effects of rheumatoid arthritis vary widely among those with the condition. CAUSES  The cause of rheumatoid arthritis is not known. It tends to run in families and is more common in women. Certain cells of the body's natural defense system (immune system) do not work properly and begin to attack healthy joints. It primarily involves the connective tissue that lines the joints (synovial membrane). This can cause damage to the joint. SYMPTOMS   Pain, stiffness, swelling, and decreased motion of many joints, especially in the hands and feet.  Stiffness that is worse in the morning. It may last 1-2 hours or longer.  Numbness and tingling in the hands.  Fatigue.  Loss of appetite.  Weight loss.  Low-grade fever.  Dry eyes and mouth.  Firm lumps (rheumatoid nodules) that grow beneath the skin in areas such as the elbows and hands. DIAGNOSIS  Diagnosis is based on the symptoms described, an exam, and blood tests. Sometimes, X-rays are helpful. TREATMENT  The goals of treatment are to relieve pain, reduce inflammation, and to slow down or stop joint damage and disability. Methods vary and may include:  Maintaining a balance of rest, exercise, and proper nutrition.  Medicines:  Pain relievers (analgesics).  Corticosteroids and nonsteroidal anti-inflammatory drugs (NSAIDs) to reduce inflammation.  Disease-modifying antirheumatic drugs (DMARDs) to try to slow the course of the disease.  Biologic response modifiers to reduce inflammation and damage.  Physical therapy and occupational therapy.  Surgery for patients with severe joint damage. Joint replacement or fusing of joints may be needed.  Routine monitoring and ongoing care, such as office visits,  blood and urine tests, and X-rays. HOME CARE INSTRUCTIONS   Remain physically active and reduce activity when the disease gets worse.  Eat a well-balanced diet.  Put heat on affected joints when you wake up and before activities. Keep the heat on the affected joint for as long as directed by your health care provider.  Put ice on affected joints following activities or exercising.  Put ice in a plastic bag.  Place a towel between your skin and the bag.  Leave the ice on for 15-20 minutes, 3-4 times per day, or as directed by your health care provider.  Take medicines and supplements only as directed by your health care provider.  Use splints as directed by your health care provider. Splints help maintain joint position and function.  Do not sleep with pillows under your knees. This may lead to spasms.  Participate in a self-management program to keep current with the latest treatment and coping skills. SEEK IMMEDIATE MEDICAL CARE IF:  You have fainting episodes.  You have periods of extreme weakness.  You rapidly develop a hot, painful joint that is more severe than usual joint aches.  You have chills.  You have a fever. FOR MORE INFORMATION   American College [employer] Rheumat[employer]: www.rheumatology.[place]: www.arthritis.org Document Released: 09/06/2000 Document Revised: 01/24/2014 Document Reviewed: 10/16/2011 [facility] Patient Information 2015 [place], [place]. This information is not intended to replace advice given to you by your health care provider. Make sure you discuss any questions you have with your health care provider.

## 2015-03-28 NOTE — Progress Notes (Signed)
Pre visit review using our clinic review tool, if applicable. No additional management support is needed unless otherwise documented below in the visit note. 

## 2015-03-29 LAB — ANA: ANA: NEGATIVE

## 2015-03-31 ENCOUNTER — Other Ambulatory Visit: Payer: Self-pay | Admitting: Nurse Practitioner

## 2015-03-31 DIAGNOSIS — E785 Hyperlipidemia, unspecified: Secondary | ICD-10-CM

## 2015-03-31 MED ORDER — ATORVASTATIN CALCIUM 10 MG PO TABS: 10.0000 mg | ORAL_TABLET | Freq: Every day | ORAL | Status: DC

## 2015-04-04 ENCOUNTER — Other Ambulatory Visit: Payer: Self-pay | Admitting: Nurse Practitioner

## 2015-04-08 DIAGNOSIS — M1990 Unspecified osteoarthritis, unspecified site: Secondary | ICD-10-CM | POA: Insufficient documentation

## 2015-04-08 NOTE — Assessment & Plan Note (Addendum)
Stable. Will continue medications as prescribed. Encouraged healthy diet and exercise. BMET and Lipid profile obtained today  BP Readings from Last 3 Encounters:  03/28/15 122/84  12/05/14 130/70  06/10/14 153/95   Lab Results  Component Value Date   CREATININE 0.73 03/28/2015

## 2015-04-08 NOTE — Assessment & Plan Note (Signed)
Pt reports being dx with RA and seen by Rheumatology- no records (saw Dr. Francella Solian. Wilfred Curtis in 2010- no records in Care Everywhere, just historical info). Pt feels she is going to flare soon and that her hands and foot are bothering her. Will obtain ANA, RF, and ESR today

## 2015-05-01 ENCOUNTER — Other Ambulatory Visit: Payer: Medicare Other

## 2015-06-14 ENCOUNTER — Encounter: Payer: Self-pay | Admitting: Nurse Practitioner

## 2015-06-14 ENCOUNTER — Ambulatory Visit (INDEPENDENT_AMBULATORY_CARE_PROVIDER_SITE_OTHER): Payer: BLUE CROSS/BLUE SHIELD | Admitting: Nurse Practitioner

## 2015-06-14 VITALS — BP 120/84 | HR 66 | Temp 98.5°F | Resp 18 | Ht 65.0 in | Wt 151.5 lb

## 2015-06-14 DIAGNOSIS — M25511 Pain in right shoulder: Secondary | ICD-10-CM

## 2015-06-14 NOTE — Progress Notes (Signed)
Pre-visit discussion using our clinic review tool. No additional management support is needed unless otherwise documented below in the visit note.  

## 2015-06-14 NOTE — Patient Instructions (Signed)

## 2015-06-14 NOTE — Progress Notes (Signed)
Patient ID: Brooke Murphy, female    DOB: 27-Jan-1961  Age: 54 y.o. MRN: [id number]  CC: Shoulder Pain   HPI Brooke Murphy presents for Right Shoulder pain x days.  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   History Brooke Murphy has a past medical history of Brain aneurysm (1999); Hypertension; Ulcer of abdomen wall; Rheumatoid arthritis (2002); and Hyperlipidemia.   Brooke Murphy has past surgical history that includes Abdominal hysterectomy (2005) and Brain surgery (Left, 11/27/1997).   Brooke Murphy family history includes Aneurysm (age of onset: 83) in Brooke Murphy mother; Cancer in Brooke Murphy father; Diabetes in Brooke Murphy maternal aunt; Hyperlipidemia in Brooke Murphy sister; Hypertension in Brooke Murphy maternal grandmother, mother, and sister; Stroke in Brooke Murphy maternal grandmother.Brooke Murphy reports that Brooke Murphy quit smoking about 4 months ago. Brooke Murphy has never used smokeless tobacco. Brooke Murphy reports that Brooke Murphy does not drink alcohol or use illicit drugs.  Outpatient Prescriptions Prior to Visit  Medication Sig Dispense Refill  . aspirin EC 81 MG tablet Take 81 mg by mouth daily.    Marland Kitchen atorvastatin (LIPITOR) 10 MG tablet Take 1 tablet (10 mg total) by mouth daily. 90 tablet 0  . bimatoprost (LUMIGAN) 0.01 % SOLN Place 1 drop into both eyes at bedtime.    . cyclobenzaprine (FLEXERIL) 10 MG tablet Take 1 tablet (10 mg total) by mouth 3 (three) times daily as needed for muscle spasms. 90 tablet 0  . fluticasone (FLONASE) 50 MCG/ACT nasal spray Place 2 sprays into both nostrils daily. 16 g 6  .  hydrochlorothiazide (HYDRODIURIL) 25 MG tablet Take 1 tablet (25 mg total) by mouth daily. 30 tablet 6  . lisinopril (PRINIVIL,ZESTRIL) 40 MG tablet Take 1 tablet (40 mg total) by mouth daily. 30 tablet 5  . metoprolol tartrate (LOPRESSOR) 25 MG tablet Take 1 tablet (25 mg total) by mouth 2 (two) times daily. 60 tablet 4  . mirtazapine (REMERON) 30 MG tablet Take 1 tablet (30 mg total) by mouth at bedtime. 30 tablet 5  . omeprazole (PRILOSEC) 40 MG capsule Take 1 capsule (40 mg total) by mouth daily. 30 capsule 5   No facility-administered medications prior to visit.    ROS Review of Systems  Constitutional: Negative for fever, chills, diaphoresis and fatigue.  Respiratory: Negative for chest tightness, shortness of breath and wheezing.   Cardiovascular: Negative for chest pain, palpitations and leg swelling.  Gastrointestinal: Negative for nausea, vomiting and diarrhea.  Musculoskeletal: Positive for myalgias. Negative for arthralgias.  Skin: Negative for rash.  Neurological: Negative for dizziness, weakness, numbness and headaches.  Psychiatric/Behavioral: The patient is not nervous/anxious.     Objective:  BP 120/84 mmHg  Pulse 66  Temp(Src) 98.5 F (36.9 C) (Oral)  Resp 18  Ht 5\' 5"  (1.651 m)  Wt 151 lb 8 oz (68.72 kg)  BMI 25.21 kg/m2  SpO2 96%  Physical Exam  Constitutional: Brooke Murphy is oriented to person, place, and time. Brooke Murphy appears well-developed and well-nourished. No distress.  HENT:  Head: Normocephalic and atraumatic.  Right Ear: External ear  normal.  Left Ear: External ear normal.  Cardiovascular: Normal rate, regular rhythm, normal heart sounds and intact distal pulses.  Exam reveals no gallop and no friction rub.   No murmur heard. Pulmonary/Chest: Effort normal and breath sounds normal. No respiratory distress. Brooke Murphy has no wheezes. Brooke Murphy has no rales. Brooke Murphy exhibits no tenderness.  Musculoskeletal: Brooke Murphy exhibits tenderness. Brooke Murphy exhibits no edema.  Trapezius muscle  tender to palpation, not shoulder joints   Neurological: Brooke Murphy is alert and oriented to person, place, and time. No cranial nerve deficit. Brooke Murphy exhibits normal muscle tone. Coordination normal.  5/5 deltoid 5/5 bicep, negative impingement.   Skin: Skin is warm and dry. No rash noted. Brooke Murphy is not diaphoretic.  Psychiatric: Brooke Murphy has a normal mood and affect. Brooke Murphy behavior is normal. Judgment and thought content normal.      Assessment & Plan:   Damya was seen today for shoulder pain.  Diagnoses and all orders for this visit:  Right shoulder pain  I am having Brooke Murphy maintain Brooke Murphy aspirin EC, bimatoprost, mirtazapine, fluticasone, omeprazole, metoprolol tartrate, cyclobenzaprine, lisinopril, hydrochlorothiazide, and atorvastatin.  No orders of the defined types were placed in this encounter.     Follow-up: Return in about 2 weeks (around 06/28/2015).

## 2015-06-15 ENCOUNTER — Encounter: Payer: Self-pay | Admitting: Nurse Practitioner

## 2015-06-15 ENCOUNTER — Other Ambulatory Visit: Payer: Self-pay | Admitting: Nurse Practitioner

## 2015-06-15 ENCOUNTER — Telehealth: Payer: Self-pay | Admitting: *Deleted

## 2015-06-15 DIAGNOSIS — M25511 Pain in right shoulder: Secondary | ICD-10-CM | POA: Insufficient documentation

## 2015-06-15 NOTE — Telephone Encounter (Signed)
Pt called stating she needs a letter stating how long she needs to stay away from repetative motion? 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 It can be fax to [place] [employer]. As soon as possible. Thank You!

## 2015-06-15 NOTE — Telephone Encounter (Signed)
Form faxed

## 2015-06-15 NOTE — Assessment & Plan Note (Signed)
Trapezius muscle on right is tight and probable spasm. Encouraged flexeril at night, 2 weeks of rest from repetitive motion work that likely caused this. Add heat to shoulder, gave script for massage x 2 visits, and stretching guide for daily shoulder stretches. FU in 2 weeks.

## 2015-06-15 NOTE — Telephone Encounter (Signed)
It is ready to be faxed. I will bring it to you.

## 2015-06-28 ENCOUNTER — Ambulatory Visit: Payer: BLUE CROSS/BLUE SHIELD | Admitting: Nurse Practitioner

## 2015-06-28 DIAGNOSIS — Z0289 Encounter for other administrative examinations: Secondary | ICD-10-CM

## 2015-07-31 ENCOUNTER — Other Ambulatory Visit: Payer: Self-pay | Admitting: Nurse Practitioner

## 2015-09-25 ENCOUNTER — Emergency Department: Payer: BLUE CROSS/BLUE SHIELD

## 2015-09-25 ENCOUNTER — Emergency Department
Admission: EM | Admit: 2015-09-25 | Discharge: 2015-09-25 | Disposition: A | Discharge status: Home / Self Care | Payer: BLUE CROSS/BLUE SHIELD | Attending: Emergency Medicine | Admitting: Emergency Medicine

## 2015-09-25 ENCOUNTER — Encounter: Payer: Self-pay | Admitting: Emergency Medicine

## 2015-09-25 DIAGNOSIS — Z87891 Personal history of nicotine dependence: Secondary | ICD-10-CM | POA: Insufficient documentation

## 2015-09-25 DIAGNOSIS — M546 Pain in thoracic spine: Secondary | ICD-10-CM | POA: Insufficient documentation

## 2015-09-25 DIAGNOSIS — I10 Essential (primary) hypertension: Secondary | ICD-10-CM | POA: Diagnosis not present

## 2015-09-25 DIAGNOSIS — G8929 Other chronic pain: Secondary | ICD-10-CM

## 2015-09-25 DIAGNOSIS — Z7951 Long term (current) use of inhaled steroids: Secondary | ICD-10-CM | POA: Insufficient documentation

## 2015-09-25 DIAGNOSIS — Z880 Allergy status to penicillin: Secondary | ICD-10-CM | POA: Insufficient documentation

## 2015-09-25 DIAGNOSIS — Z7982 Long term (current) use of aspirin: Secondary | ICD-10-CM | POA: Insufficient documentation

## 2015-09-25 DIAGNOSIS — Z79899 Other long term (current) drug therapy: Secondary | ICD-10-CM | POA: Diagnosis not present

## 2015-09-25 MED ORDER — PREDNISONE 10 MG PO TABS: ORAL_TABLET | ORAL | Status: DC

## 2015-09-25 NOTE — ED Provider Notes (Signed)
[facility] Emergency Department Provider Note  ____________________________________________  Time seen: Approximately 10:27 AM  I have reviewed the triage vital signs and the nursing notes.   HISTORY  Chief Complaint Back Pain    HPI Brooke Murphy is a 55 y.o. female is here with complaint of upper back pain that started approximately one week ago. Patient states that she tried muscle relaxants without any relief and relates that she has a history of "pinched nerve". Patient states that she is taking Flexeril at present without any relief. Patient states she has not had any injury to her back and also has not had any x-rays and "a long time". She denies any paresthesias to her extremities. Range of motion is decreased secondary patient's pain. She denies any nausea, vomiting, or urinary symptoms. Her only her pain is 10 over 10.   Past Medical History  Diagnosis Date  . Brain aneurysm 1999    [facility]  . Hypertension   . Ulcer of abdomen wall ([place])   . Rheumatoid arthritis ([place]) 2002    s/p methotrexate, humira and embrel  . Hyperlipidemia     Patient Active Problem List   Diagnosis Date Noted  . Right shoulder pain 06/15/2015  . Arthritis 04/08/2015  . HTN (hypertension) 12/10/2014  . Allergic rhinitis 12/10/2014  . Thoracic back pain 12/10/2014    Past Surgical History  Procedure Laterality Date  . Abdominal hysterectomy  2005    ovaries remain  . Brain surgery Left 11/27/1997    aneurysm    Current Outpatient Rx  Name  Route  Sig  Dispense  Refill  . aspirin EC 81 MG tablet   Oral   Take 81 mg by mouth daily.         Marland Kitchen atorvastatin (LIPITOR) 10 MG tablet   Oral   Take 1 tablet (10 mg total) by mouth daily.   90 tablet   0   . bimatoprost (LUMIGAN) 0.01 % SOLN   Both Eyes   Place 1 drop into both eyes at bedtime.         . cyclobenzaprine (FLEXERIL) 10 MG tablet   Oral   Take 1 tablet (10 mg total) by mouth 3 (three) times daily  as needed for muscle spasms.   90 tablet   0   . fluticasone (FLONASE) 50 MCG/ACT nasal spray   Each Nare   Place 2 sprays into both nostrils daily.   16 g   6   . hydrochlorothiazide (HYDRODIURIL) 25 MG tablet   Oral   Take 1 tablet (25 mg total) by mouth daily.   30 tablet   6   . lisinopril (PRINIVIL,ZESTRIL) 40 MG tablet   Oral   Take 1 tablet (40 mg total) by mouth daily.   30 tablet   5   . metoprolol tartrate (LOPRESSOR) 25 MG tablet      TAKE 1 TABLET (25 MG TOTAL) BY MOUTH 2 (TWO) TIMES DAILY.   60 tablet   4   . mirtazapine (REMERON) 30 MG tablet   Oral   Take 1 tablet (30 mg total) by mouth at bedtime.   30 tablet   5   . omeprazole (PRILOSEC) 40 MG capsule   Oral   Take 1 capsule (40 mg total) by mouth daily.   30 capsule   5   . predniSONE (DELTASONE) 10 MG tablet      Take 6 tablets  today, on day 2 take 5 tablets,  day 3 take 4 tablets, day 4 take 3 tablets, day 5 take  2 tablets and 1 tablet the last day   21 tablet   0     Allergies Celebrex; Lodine; and Penicillins  Family History  Problem Relation Age of Onset  . Hypertension Mother   . Aneurysm Mother 60    died  . Hyperlipidemia Sister   . Hypertension Sister   . Diabetes Maternal Aunt   . Hypertension Maternal Grandmother   . Stroke Maternal Grandmother   . Cancer Father     Social History Social History  Substance Use Topics  . Smoking status: Former Smoker    Quit date: 01/23/2015  . Smokeless tobacco: Never Used  . Alcohol Use: No    Review of Systems Constitutional: No fever/chills ENT: No sore throat. Cardiovascular: Denies chest pain. Respiratory: Denies shortness of breath. Gastrointestinal: No abdominal pain.  No nausea, no vomiting.  No diarrhea.  No constipation. Genitourinary: Negative for dysuria. Musculoskeletal: Positive for back pain. Skin: Negative for rash. Neurological: Negative for headaches, focal weakness or numbness.  10-point ROS otherwise  negative.  ____________________________________________   PHYSICAL EXAM:  VITAL SIGNS: ED Triage Vitals  Enc Vitals Group     BP 09/25/15 1015 153/96 mmHg     Pulse Rate 09/25/15 1015 66     Resp 09/25/15 1015 20     Temp 09/25/15 1015 98.3 F (36.8 C)     Temp Source 09/25/15 1015 Oral     SpO2 09/25/15 1015 100 %     Weight 09/25/15 1015 151 lb (68.493 kg)     Height 09/25/15 1015 5\' 5"  (1.651 m)     Head Cir --      Peak Flow --      Pain Score 09/25/15 1017 10     Pain Loc --      Pain Edu? --      Excl. in [place]? --     Constitutional: Alert and oriented. Well appearing and in no acute distress. Eyes: Conjunctivae are normal. PERRL. EOMI. Head: Atraumatic. Nose: No congestion/rhinnorhea. Mouth/Throat: Mucous membranes are moist.  Oropharynx non-erythematous. Neck: No stridor.  Supple.   cervical spine nontender on palpation. Cardiovascular: Normal rate, regular rhythm. Grossly normal heart sounds.  Good peripheral circulation. Respiratory: Normal respiratory effort.  No retractions. Lungs CTAB. Gastrointestinal: Soft and nontender. No distention.  Musculoskeletal: Moves upper and lower extremities without any difficulty. There is tenderness on palpation of the vertebral bodies thoracic spine and paravertebral muscles bilaterally. Range of motion is slightly restricted secondary to discomfort and no active muscle spasm nursing. Normal gait was noted in the ER. Neurologic:  Normal speech and language. No gross focal neurologic deficits are appreciated. No gait instability. Skin:  Skin is warm, dry and intact. No rash noted. Psychiatric: Mood and affect are normal. Speech and behavior are normal.  ____________________________________________   LABS (all labs ordered are listed, but only abnormal results are displayed)  Labs Reviewed - No data to display   RADIOLOGY  Chest X-rays showed no evidence of spine fracture and alignment  normal. ____________________________________________   PROCEDURES  Procedure(s) performed: None  Critical Care performed: No  ____________________________________________   INITIAL IMPRESSION / ASSESSMENT AND PLAN / ED COURSE  Pertinent labs & imaging results that were available during my care of the patient were reviewed by me and considered in my medical decision making (see chart for details).  Patient currently is taking Flexeril. She cannot take anti-inflammatories however  she can take prednisone. Patient was started on 60 mg 6 day taper. She is to follow-up with her primary care if any continued problems. ____________________________________________   FINAL CLINICAL IMPRESSION(S) / ED DIAGNOSES  Final diagnoses:  Chronic bilateral thoracic back pain      Johnn Hai, PA-C 09/25/15 1724  Lavonia Drafts, MD 10/01/15 1413

## 2015-09-25 NOTE — Discharge Instructions (Signed)
Back Pain, Adult Back pain is very common. The pain often gets better over time. The cause of back pain is usually not dangerous. Most people can learn to manage their back pain on their own.  HOME CARE  Watch your back pain for any changes. The following actions may help to lessen any pain you are feeling:  Stay active. Start with short walks on flat ground if you can. Try to walk farther each day.  Exercise regularly as told by your doctor. Exercise helps your back heal faster. It also helps avoid future injury by keeping your muscles strong and flexible.  Do not sit, drive, or stand in one place for more than 30 minutes.  Do not stay in bed. Resting more than 1-2 days can slow down your recovery.  Be careful when you bend or lift an object. Use good form when lifting:  Bend at your knees.  Keep the object close to your body.  Do not twist.  Sleep on a firm mattress. Lie on your side, and bend your knees. If you lie on your back, put a pillow under your knees.  Take medicines only as told by your doctor.  Put ice on the injured area.  Put ice in a plastic bag.  Place a towel between your skin and the bag.  Leave the ice on for 20 minutes, 2-3 times a day for the first 2-3 days. After that, you can switch between ice and heat packs.  Avoid feeling anxious or stressed. Find good ways to deal with stress, such as exercise.  Maintain a healthy weight. Extra weight puts stress on your back. GET HELP IF:   You have pain that does not go away with rest or medicine.  You have worsening pain that goes down into your legs or buttocks.  You have pain that does not get better in one week.  You have pain at night.  You lose weight.  You have a fever or chills. GET HELP RIGHT AWAY IF:   You cannot control when you poop (bowel movement) or pee (urinate).  Your arms or legs feel weak.  Your arms or legs lose feeling (numbness).  You feel sick to your stomach (nauseous) or  throw up (vomit).  You have belly (abdominal) pain.  You feel like you may pass out (faint).   This information is not intended to replace advice given to you by your health care provider. Make sure you discuss any questions you have with your health care provider.   Document Released: 02/26/2008 Document Revised: 09/30/2014 Document Reviewed: 01/11/2014 Elsevier Interactive Patient Education 2016 [place] with your family doctor if any continued problems. Continue taking Flexeril as needed. Begin taking prednisone as directed.

## 2015-09-25 NOTE — ED Notes (Addendum)
Pt to ed with c/o upper back pain that started about 1 week ago, denies injury.  Pt states she has tried muscles relaxers without relief. Hx of "pinched nerve in back"

## 2015-11-16 ENCOUNTER — Other Ambulatory Visit: Payer: Self-pay | Admitting: Nurse Practitioner

## 2016-07-28 ENCOUNTER — Emergency Department: Payer: PRIVATE HEALTH INSURANCE

## 2016-07-28 ENCOUNTER — Emergency Department
Admission: EM | Admit: 2016-07-28 | Discharge: 2016-07-28 | Disposition: A | Discharge status: Home / Self Care | Payer: PRIVATE HEALTH INSURANCE | Attending: Emergency Medicine | Admitting: Emergency Medicine

## 2016-07-28 ENCOUNTER — Encounter: Payer: Self-pay | Admitting: Emergency Medicine

## 2016-07-28 DIAGNOSIS — M792 Neuralgia and neuritis, unspecified: Secondary | ICD-10-CM | POA: Insufficient documentation

## 2016-07-28 DIAGNOSIS — Z7982 Long term (current) use of aspirin: Secondary | ICD-10-CM | POA: Insufficient documentation

## 2016-07-28 DIAGNOSIS — I10 Essential (primary) hypertension: Secondary | ICD-10-CM | POA: Diagnosis not present

## 2016-07-28 DIAGNOSIS — Z87891 Personal history of nicotine dependence: Secondary | ICD-10-CM | POA: Insufficient documentation

## 2016-07-28 DIAGNOSIS — Z79899 Other long term (current) drug therapy: Secondary | ICD-10-CM | POA: Diagnosis not present

## 2016-07-28 DIAGNOSIS — R51 Headache: Secondary | ICD-10-CM | POA: Diagnosis present

## 2016-07-28 LAB — CBC
HCT: 43 % (ref 35.0–47.0)
HEMOGLOBIN: 14.4 g/dL (ref 12.0–16.0)
MCH: 30.7 pg (ref 26.0–34.0)
MCHC: 33.6 g/dL (ref 32.0–36.0)
MCV: 91.4 fL (ref 80.0–100.0)
Platelets: 354 10*3/uL (ref 150–440)
RBC: 4.7 MIL/uL (ref 3.80–5.20)
RDW: 15.3 % — AB (ref 11.5–14.5)
WBC: 6.3 10*3/uL (ref 3.6–11.0)

## 2016-07-28 LAB — BASIC METABOLIC PANEL
ANION GAP: 8 (ref 5–15)
BUN: 13 mg/dL (ref 6–20)
CALCIUM: 9.7 mg/dL (ref 8.9–10.3)
CO2: 26 mmol/L (ref 22–32)
Chloride: 102 mmol/L (ref 101–111)
Creatinine, Ser: 0.67 mg/dL (ref 0.44–1.00)
GFR calc Af Amer: >60 mL/min (ref >60)
GLUCOSE: 88 mg/dL (ref 65–99)
Potassium: 3.8 mmol/L (ref 3.5–5.1)
SODIUM: 136 mmol/L (ref 135–145)

## 2016-07-28 MED ORDER — GABAPENTIN 100 MG PO CAPS
100.0000 mg | ORAL_CAPSULE | Freq: Once | ORAL | Status: AC
  Administered 2016-07-28: 100 mg via ORAL
  Filled 2016-07-28: qty 1

## 2016-07-28 MED ORDER — GABAPENTIN 100 MG PO CAPS: 100.0000 mg | ORAL_CAPSULE | Freq: Three times a day (TID) | ORAL | 0 refills | Status: DC

## 2016-07-28 NOTE — ED Provider Notes (Addendum)
[facility] Emergency Department Provider Note  Time seen: 7:32 AM  I have reviewed the triage vital signs and the nursing notes.   HISTORY  Chief Complaint Headache and Weakness    HPI Brooke Murphy is a 55 y.o. female *with a past medical history of subarachnoid hemorrhage/aneurysm, status post repair in 1999, hypertension, hyperlipidemia who presents the emergency department with left head pain. According to the patient for the past 3 days she has been experiencing pain and tenderness to the left scalp. Patient states the pain and tenderness is overlying her incision with occasional pain shooting from the incision. Denies any new weakness or numbness. Does state mild left-sided weakness since 1999 but this is unchanged. Denies any difficulty speaking. Denies slurred speech. Denies confusion. Patient states she was having sharp shooting pains over night making it difficult to sleep, she was concerned that something went up happen to her aneurysm clipped so she came to the emergency department for evaluation.  Past Medical History:  Diagnosis Date  . Brain aneurysm 1999   [facility]  . Hyperlipidemia   . Hypertension   . Rheumatoid arthritis ([place]) 2002   s/p methotrexate, humira and embrel  . Ulcer of abdomen wall [facility])     Patient Active Problem List   Diagnosis Date Noted  . Right shoulder pain 06/15/2015  . Arthritis 04/08/2015  . HTN (hypertension) 12/10/2014  . Allergic rhinitis 12/10/2014  . Thoracic back pain 12/10/2014    Past Surgical History:  Procedure Laterality Date  . ABDOMINAL HYSTERECTOMY  2005   ovaries remain  . BRAIN SURGERY Left 11/27/1997   aneurysm    Prior to Admission medications   Medication Sig Start Date End Date Taking? Authorizing Provider  aspirin EC 81 MG tablet Take 81 mg by mouth daily.    Historical Provider, MD  atorvastatin (LIPITOR) 10 MG tablet Take 1 tablet (10 mg total) by mouth daily. 03/31/15   Rubbie Battiest, NP   bimatoprost (LUMIGAN) 0.01 % SOLN Place 1 drop into both eyes at bedtime.    Historical Provider, MD  cyclobenzaprine (FLEXERIL) 10 MG tablet Take 1 tablet (10 mg total) by mouth 3 (three) times daily as needed for muscle spasms. 12/05/14   Rubbie Battiest, NP  fluticasone (FLONASE) 50 MCG/ACT nasal spray Place 2 sprays into both nostrils daily. 04/26/14   Raquel Dagoberto Ligas, NP  hydrochlorothiazide (HYDRODIURIL) 25 MG tablet Take 1 tablet (25 mg total) by mouth daily. 03/28/15   Rubbie Battiest, NP  lisinopril (PRINIVIL,ZESTRIL) 40 MG tablet TAKE 1 TABLET (40 MG TOTAL) BY MOUTH DAILY. 11/16/15   Rubbie Battiest, NP  metoprolol tartrate (LOPRESSOR) 25 MG tablet TAKE 1 TABLET (25 MG TOTAL) BY MOUTH 2 (TWO) TIMES DAILY. 07/31/15   Rubbie Battiest, NP  mirtazapine (REMERON) 30 MG tablet Take 1 tablet (30 mg total) by mouth at bedtime. 04/26/14   Raquel Dagoberto Ligas, NP  omeprazole (PRILOSEC) 40 MG capsule Take 1 capsule (40 mg total) by mouth daily. 12/01/14   Rubbie Battiest, NP  predniSONE (DELTASONE) 10 MG tablet Take 6 tablets  today, on day 2 take 5 tablets, day 3 take 4 tablets, day 4 take 3 tablets, day 5 take  2 tablets and 1 tablet the last day 09/25/15   Johnn Hai, PA-C    Allergies  Allergen Reactions  . Celebrex [Celecoxib]   . Lodine [Etodolac]   . Penicillins     Family History  Problem Relation Age  of Onset  . Hypertension Mother   . Aneurysm Mother 71    died  . Hyperlipidemia Sister   . Hypertension Sister   . Cancer Father   . Diabetes Maternal Aunt   . Hypertension Maternal Grandmother   . Stroke Maternal Grandmother     Social History Social History  Substance Use Topics  . Smoking status: Former Smoker    Quit date: 01/23/2015  . Smokeless tobacco: Never Used  . Alcohol use No    Review of Systems Constitutional: Negative for fever. Cardiovascular: Negative for chest pain. Respiratory: Negative for shortness of breath. Gastrointestinal: Negative for abdominal pain Neurological:  Positive for scalp pain, shooting pains from the incision. 10-point ROS otherwise negative.  ____________________________________________   PHYSICAL EXAM:  VITAL SIGNS: ED Triage Vitals  Enc Vitals Group     BP 07/28/16 0551 (!) 174/91     Pulse Rate 07/28/16 0551 87     Resp 07/28/16 0551 18     Temp 07/28/16 0551 98.2 F (36.8 C)     Temp Source 07/28/16 0551 Oral     SpO2 07/28/16 0551 100 %     Weight 07/28/16 0552 140 lb (63.5 kg)     Height 07/28/16 0552 5\' 5"  (1.651 m)     Head Circumference --      Peak Flow --      Pain Score 07/28/16 0552 8     Pain Loc --      Pain Edu? --      Excl. in [place]? --     Constitutional: Alert and oriented. Well appearing and in no distress. Eyes: Normal exam ENT   Head: Normocephalic and atraumatic.Significant Tenderness over the parietal incision of the left side. No erythema or infectious signs.   Mouth/Throat: Mucous membranes are moist. Cardiovascular: Normal rate, regular rhythm. No murmur Respiratory: Normal respiratory effort without tachypnea nor retractions. Breath sounds are clear Gastrointestinal: Soft and nontender. No distention. Musculoskeletal: Nontender with normal range of motion in all extremities.  Neurologic:  Normal speech and language. 4+/5 strength in left upper extremity, patient states this is unchanged. Cranial nerves intact. Skin:  Skin is warm, dry and intact.  Psychiatric: Mood and affect are normal. Speech and behavior are normal.   ____________________________________________     RADIOLOGY  CT head negative  EKG reviewed and interpreted by myself shows normal sinus rhythm at 83 bpm, narrow QS complexes, normal intervals, nonspecific but no concerning ST changes. ____________________________________________   INITIAL IMPRESSION / ASSESSMENT AND PLAN / ED COURSE  Pertinent labs & imaging results that were available during my care of the patient were reviewed by me and considered in my  medical decision making (see chart for details).  Patient presents the emergency department left scalp pain. Asians does have tenderness overlying her parietal scalp incision. States occasional shooting pains from the incision. States she was having shooting pains all the way down to her jaw, states they're brief. Denies any difficulty swallowing or speaking. Denies any new weakness or numbness. Overall patient appears well, slight left-sided weakness but the patient states this is unchanged. CT head is negative. Labs within normal limits. Patient's workup is most consistent with neuropathic pain. We will treat with gabapentin, and have the patient follow-up with a neurologist. I do not suspect aneurysmal involvement causing her symptoms.  ____________________________________________   FINAL CLINICAL IMPRESSION(S) / ED DIAGNOSES  Neuropathic pain    Harvest Dark, MD 07/28/16 [id number]    Harvest Dark, MD 07/28/16  0743  

## 2016-07-28 NOTE — Discharge Instructions (Signed)
As we discussed taking her medications as prescribed. Please follow-up with neurology because the number provided to arrange an appointment. Return to the emergency department for any significant increase in pain, any weakness or numbness of any arm or leg, confusion or slurred speech, or any other symptom personally concerning to yourself.

## 2016-07-28 NOTE — ED Triage Notes (Addendum)
Pt reports 3 days of headache to the left side of her head; intermittent sharp pains; history of aneurysm to that area-repaired; says head tender to touch; c/o left sided weakness; also says when she swallows it feels like something is stuck in the left side of her throat; pt says the headache pain has kept her awake all night; alert and oriented x 3; talking in complete coherent sentences;

## 2016-07-28 NOTE — ED Notes (Signed)
Patient to ED 1 from CT at this time. RN to bedside to start work up.

## 2016-08-16 ENCOUNTER — Other Ambulatory Visit: Payer: Self-pay | Admitting: Nurse Practitioner

## 2016-08-19 ENCOUNTER — Other Ambulatory Visit: Payer: Self-pay | Admitting: Nurse Practitioner

## 2016-08-22 ENCOUNTER — Other Ambulatory Visit: Payer: Self-pay | Admitting: Nurse Practitioner

## 2016-08-27 ENCOUNTER — Encounter: Payer: Self-pay | Admitting: Family

## 2016-08-27 ENCOUNTER — Ambulatory Visit (INDEPENDENT_AMBULATORY_CARE_PROVIDER_SITE_OTHER): Payer: PRIVATE HEALTH INSURANCE | Admitting: Family

## 2016-08-27 VITALS — BP 160/100 | HR 86 | Temp 98.3°F | Ht 65.0 in | Wt 133.2 lb

## 2016-08-27 DIAGNOSIS — F32A Depression, unspecified: Secondary | ICD-10-CM | POA: Insufficient documentation

## 2016-08-27 DIAGNOSIS — Z1239 Encounter for other screening for malignant neoplasm of breast: Secondary | ICD-10-CM

## 2016-08-27 DIAGNOSIS — Z1231 Encounter for screening mammogram for malignant neoplasm of breast: Secondary | ICD-10-CM

## 2016-08-27 DIAGNOSIS — E785 Hyperlipidemia, unspecified: Secondary | ICD-10-CM | POA: Diagnosis not present

## 2016-08-27 DIAGNOSIS — K219 Gastro-esophageal reflux disease without esophagitis: Secondary | ICD-10-CM | POA: Diagnosis not present

## 2016-08-27 DIAGNOSIS — I10 Essential (primary) hypertension: Secondary | ICD-10-CM

## 2016-08-27 DIAGNOSIS — J302 Other seasonal allergic rhinitis: Secondary | ICD-10-CM | POA: Diagnosis not present

## 2016-08-27 DIAGNOSIS — F329 Major depressive disorder, single episode, unspecified: Secondary | ICD-10-CM

## 2016-08-27 LAB — TSH: TSH: 0.16 u[IU]/mL — AB (ref 0.35–4.50)

## 2016-08-27 LAB — COMPREHENSIVE METABOLIC PANEL
ALBUMIN: 4.4 g/dL (ref 3.5–5.2)
ALK PHOS: 81 U/L (ref 39–117)
ALT: 7 U/L (ref 0–35)
AST: 12 U/L (ref 0–37)
BILIRUBIN TOTAL: 0.6 mg/dL (ref 0.2–1.2)
BUN: 11 mg/dL (ref 6–23)
CALCIUM: 9.6 mg/dL (ref 8.4–10.5)
CO2: 29 mEq/L (ref 19–32)
Chloride: 99 mEq/L (ref 96–112)
Creatinine, Ser: 0.6 mg/dL (ref 0.40–1.20)
GFR: 132.48 mL/min (ref >60.00)
GLUCOSE: 86 mg/dL (ref 70–99)
Potassium: 4.4 mEq/L (ref 3.5–5.1)
Sodium: 138 mEq/L (ref 135–145)
TOTAL PROTEIN: 7.1 g/dL (ref 6.0–8.3)

## 2016-08-27 LAB — CBC WITH DIFFERENTIAL/PLATELET
BASOS ABS: 0.1 10*3/uL (ref 0.0–0.1)
Basophils Relative: 1 % (ref 0.0–3.0)
Eosinophils Absolute: 0.1 10*3/uL (ref 0.0–0.7)
Eosinophils Relative: 1.7 % (ref 0.0–5.0)
HEMATOCRIT: 41.5 % (ref 36.0–46.0)
HEMOGLOBIN: 13.7 g/dL (ref 12.0–15.0)
LYMPHS PCT: 35.6 % (ref 12.0–46.0)
Lymphs Abs: 2.1 10*3/uL (ref 0.7–4.0)
MCHC: 33.1 g/dL (ref 30.0–36.0)
MCV: 91.6 fl (ref 78.0–100.0)
MONOS PCT: 7.2 % (ref 3.0–12.0)
Monocytes Absolute: 0.4 10*3/uL (ref 0.1–1.0)
Neutro Abs: 3.2 10*3/uL (ref 1.4–7.7)
Neutrophils Relative %: 54.5 % (ref 43.0–77.0)
Platelets: 311 10*3/uL (ref 150.0–400.0)
RBC: 4.53 Mil/uL (ref 3.87–5.11)
RDW: 16 % — ABNORMAL HIGH (ref 11.5–15.5)
WBC: 5.9 10*3/uL (ref 4.0–10.5)

## 2016-08-27 LAB — LIPID PANEL
CHOL/HDL RATIO: 4
CHOLESTEROL: 200 mg/dL (ref 0–200)
HDL: 51.6 mg/dL (ref >39.00)
LDL Cholesterol: 120 mg/dL — ABNORMAL HIGH (ref 0–99)
NONHDL: 148.41
TRIGLYCERIDES: 140 mg/dL (ref 0.0–149.0)
VLDL: 28 mg/dL (ref 0.0–40.0)

## 2016-08-27 LAB — VITAMIN D 25 HYDROXY (VIT D DEFICIENCY, FRACTURES): VITD: 12.52 ng/mL — ABNORMAL LOW (ref 30.00–100.00)

## 2016-08-27 LAB — HEMOGLOBIN A1C: HEMOGLOBIN A1C: 5.9 % (ref 4.6–6.5)

## 2016-08-27 MED ORDER — LISINOPRIL 40 MG PO TABS: ORAL_TABLET | ORAL | 1 refills | Status: DC

## 2016-08-27 MED ORDER — LORATADINE 10 MG PO TABS: 10.0000 mg | ORAL_TABLET | Freq: Every day | ORAL | 1 refills | Status: DC

## 2016-08-27 MED ORDER — METOPROLOL TARTRATE 25 MG PO TABS: ORAL_TABLET | ORAL | 4 refills | Status: DC

## 2016-08-27 MED ORDER — ATORVASTATIN CALCIUM 10 MG PO TABS: 10.0000 mg | ORAL_TABLET | Freq: Every day | ORAL | 0 refills | Status: DC

## 2016-08-27 MED ORDER — MIRTAZAPINE 30 MG PO TABS: 30.0000 mg | ORAL_TABLET | Freq: Every day | ORAL | 1 refills | Status: DC

## 2016-08-27 NOTE — Assessment & Plan Note (Signed)
Stable  Continue current regimen  

## 2016-08-27 NOTE — Progress Notes (Signed)
Subjective:    Patient ID: Brooke Murphy, female    DOB: [birth year], 55 y.o.   MRN: [id number]  CC: Brooke Murphy is a 55 y.o. female who presents today for follow up.   HPI: CC: medication refills.   Also complains of sinus congestion, post nasal drip for past several months 'since summer'. On flonase which helps but 'nose gets real dry.'  Occasional cough, worse at night. Former smoker. No fever, chills, SOB, wheezing, HA.   HTN- ran out of HTN medication yesterday. Denies exertional chest pain or pressure, numbness or tingling radiating to left arm or jaw, palpitations, dizziness, frequent headaches, changes in vision, or shortness of breath.   GERD- No trouble swallowing. Has lost weight recently, not eating as much.   Former patient and [employer]. Last seen September 2016.      HISTORY:  Past Medical History:  Diagnosis Date  . Brain aneurysm 1999   [facility]  . Hyperlipidemia   . Hypertension   . Rheumatoid arthritis ([place]) 2002   s/p methotrexate, humira and embrel  . Ulcer of abdomen wall [facility]    Past Surgical History:  Procedure Laterality Date  . ABDOMINAL HYSTERECTOMY  2005   ovaries remain  . BRAIN SURGERY Left 11/27/1997   aneurysm   Family History  Problem Relation Age of Onset  . Hypertension Mother   . Aneurysm Mother 20    died  . Hyperlipidemia Sister   . Hypertension Sister   . Cancer Father   . Diabetes Maternal Aunt   . Hypertension Maternal Grandmother   . Stroke Maternal Grandmother     Allergies: Celebrex [celecoxib]; Lodine [etodolac]; and Penicillins Current Outpatient Prescriptions on File Prior to Visit  Medication Sig Dispense Refill  . aspirin EC 81 MG tablet Take 81 mg by mouth daily.    . bimatoprost (LUMIGAN) 0.01 % SOLN Place 1 drop into both eyes at bedtime.    . cyclobenzaprine (FLEXERIL) 10 MG tablet Take 1 tablet (10 mg total) by mouth 3 (three) times daily as needed for muscle spasms. 90 tablet 0  . fluticasone (FLONASE) 50  MCG/ACT nasal spray Place 2 sprays into both nostrils daily. 16 g 6  . omeprazole (PRILOSEC) 40 MG capsule Take 1 capsule (40 mg total) by mouth daily. 30 capsule 5   No current facility-administered medications on file prior to visit.     Social History  Substance Use Topics  . Smoking status: Former Smoker    Quit date: 01/23/2015  . Smokeless tobacco: Never Used  . Alcohol use No    Review of Systems  Constitutional: Positive for unexpected weight change. Negative for chills and fever.  HENT: Positive for congestion, ear pain (right side popping), postnasal drip and sinus pressure. Negative for sore throat.   Eyes: Negative for visual disturbance.  Respiratory: Positive for cough. Negative for shortness of breath and wheezing.   Cardiovascular: Negative for chest pain and palpitations.  Gastrointestinal: Negative for nausea and vomiting.  Neurological: Negative for headaches.      Objective:    BP (!) 160/100   Pulse 86   Temp 98.3 F (36.8 C) (Oral)   Ht 5\' 5"  (1.651 m)   Wt 133 lb 3.2 oz (60.4 kg)   SpO2 99%   BMI 22.17 kg/m  BP Readings from Last 3 Encounters:  08/27/16 (!) 160/100  07/28/16 (!) 166/99  09/25/15 (!) 153/96   Wt Readings from Last 3 Encounters:  08/27/16 133 lb 3.2 oz (60.4  kg)  07/28/16 140 lb (63.5 kg)  09/25/15 151 lb (68.5 kg)    Physical Exam  Constitutional: She appears well-developed and well-nourished.  HENT:  Head: Normocephalic and atraumatic.  Right Ear: Hearing, tympanic membrane, external ear and ear canal normal. No drainage, swelling or tenderness. No foreign bodies. Tympanic membrane is not erythematous and not bulging. No middle ear effusion. No decreased hearing is noted.  Left Ear: Hearing, tympanic membrane, external ear and ear canal normal. No drainage, swelling or tenderness. No foreign bodies. Tympanic membrane is not erythematous and not bulging.  No middle ear effusion. No decreased hearing is noted.  Nose: Nose normal.  No rhinorrhea. Right sinus exhibits no maxillary sinus tenderness and no frontal sinus tenderness. Left sinus exhibits no maxillary sinus tenderness and no frontal sinus tenderness.  Mouth/Throat: Uvula is midline, oropharynx is clear and moist and mucous membranes are normal. No oropharyngeal exudate, posterior oropharyngeal edema, posterior oropharyngeal erythema or tonsillar abscesses.  Eyes: Conjunctivae are normal.  Cardiovascular: Regular rhythm, normal heart sounds and normal pulses.   Pulmonary/Chest: Effort normal and breath sounds normal. She has no wheezes. She has no rhonchi. She has no rales.  Lymphadenopathy:       Head (right side): No submental, no submandibular, no tonsillar, no preauricular, no posterior auricular and no occipital adenopathy present.       Head (left side): No submental, no submandibular, no tonsillar, no preauricular, no posterior auricular and no occipital adenopathy present.    She has no cervical adenopathy.  Neurological: She is alert.  Skin: Skin is warm and dry.  Psychiatric: She has a normal mood and affect. Her speech is normal and behavior is normal. Thought content normal.  Vitals reviewed.      Assessment & Plan:   Problem List Items Addressed This Visit      Cardiovascular and Mediastinum   HTN (hypertension) - Primary    Elevated today. Patient has been off medications today. I refilled her BP medications and advised patient to call us if blood pressure is not less than 140/90 when she gets home and takes medication. Reassured the patient does not have any acute signs of hypertensive urgency or emergency at this time. Follow-up in 3 months.      Relevant Medications   lisinopril (PRINIVIL,ZESTRIL) 40 MG tablet   metoprolol tartrate (LOPRESSOR) 25 MG tablet   mirtazapine (REMERON) 30 MG tablet   atorvastatin (LIPITOR) 10 MG tablet   loratadine (CLARITIN) 10 MG tablet   Other Relevant Orders   MM DIGITAL SCREENING BILATERAL   CBC with  Differential/Platelet   Comprehensive metabolic panel   Hemoglobin A1c   Hepatitis C antibody   HIV antibody   Lipid panel   TSH   VITAMIN D 25 Hydroxy (Vit-D Deficiency, Fractures)     Respiratory   Allergic rhinitis    Working diagnosis of sinus congestion as consistent with allergic rhinitis. Trial of Claritin and advised patient to use Flonase every other day to prevent nasal drying. Return precautions given.      Relevant Medications   loratadine (CLARITIN) 10 MG tablet     Digestive   GERD (gastroesophageal reflux disease)    Controlled with Prilosec 40 daily. Patient is due for colonoscopy and in the context of her unintentional weight loss, approximately 20 pounds, I went ahead and place referral for colonoscopy. She may also be evaluated and need EGD as well.       Relevant Orders   Ambulatory referral to  Gastroenterology     Other   HLD (hyperlipidemia)    Pending lipid panel.      Relevant Medications   lisinopril (PRINIVIL,ZESTRIL) 40 MG tablet   metoprolol tartrate (LOPRESSOR) 25 MG tablet   atorvastatin (LIPITOR) 10 MG tablet   Depression    Stable. Continue current regimen.      Relevant Medications   mirtazapine (REMERON) 30 MG tablet    Other Visit Diagnoses    Screening for breast cancer       Relevant Orders   MM DIGITAL SCREENING BILATERAL       I have discontinued Ms. Rico's hydrochlorothiazide, predniSONE, and gabapentin. I am also having her start on loratadine. Additionally, I am having her maintain her aspirin EC, bimatoprost, fluticasone, omeprazole, cyclobenzaprine, lisinopril, metoprolol tartrate, mirtazapine, and atorvastatin.   Meds ordered this encounter  Medications  . lisinopril (PRINIVIL,ZESTRIL) 40 MG tablet    Sig: TAKE 1 TABLET (40 MG TOTAL) BY MOUTH DAILY.    Dispense:  90 tablet    Refill:  1  . metoprolol tartrate (LOPRESSOR) 25 MG tablet    Sig: TAKE 1 TABLET (25 MG TOTAL) BY MOUTH 2 (TWO) TIMES DAILY.    Dispense:   180 tablet    Refill:  4  . mirtazapine (REMERON) 30 MG tablet    Sig: Take 1 tablet (30 mg total) by mouth at bedtime.    Dispense:  90 tablet    Refill:  1  . atorvastatin (LIPITOR) 10 MG tablet    Sig: Take 1 tablet (10 mg total) by mouth daily.    Dispense:  90 tablet    Refill:  0    Order Specific Question:   Supervising Provider    Answer:   Deborra Medina L [[id number]]  . loratadine (CLARITIN) 10 MG tablet    Sig: Take 1 tablet (10 mg total) by mouth daily.    Dispense:  90 tablet    Refill:  1    Order Specific Question:   Supervising Provider    Answer:   Crecencio Mc [[id number]]    Return precautions given.   Risks, benefits, and alternatives of the medications and treatment plan prescribed today were discussed, and patient expressed understanding.   Education regarding symptom management and diagnosis given to patient on AVS.  Continue to follow with [name], FNP for routine health maintenance.   Talmadge Coventry and I agreed with plan.   [name], FNP

## 2016-08-27 NOTE — Assessment & Plan Note (Addendum)
New.Working diagnosis of sinus congestion as consistent with allergic rhinitis. Trial of Claritin and advised patient to use Flonase every other day to prevent nasal drying. Return precautions given.

## 2016-08-27 NOTE — Progress Notes (Signed)
Pre visit review using our clinic review tool, if applicable. No additional management support is needed unless otherwise documented below in the visit note. 

## 2016-08-27 NOTE — Assessment & Plan Note (Signed)
Controlled with Prilosec 40 daily. Patient is due for colonoscopy and in the context of her unintentional weight loss, approximately 20 pounds, I went ahead and place referral for colonoscopy. She may also be evaluated and need EGD as well.

## 2016-08-27 NOTE — Assessment & Plan Note (Signed)
Elevated today. Patient has been off medications today. I refilled her BP medications and advised patient to call us if blood pressure is not less than 140/90 when she gets home and takes medication. Reassured the patient does not have any acute signs of hypertensive urgency or emergency at this time. Follow-up in 3 months.

## 2016-08-27 NOTE — Patient Instructions (Signed)
Labs today  Trial of claritin. Let me know if not better  Please call if your BP is not less than 140/90 when you get home and take your medications.   You are due for your complete physical exam. Please make that appointment at checkout. We can also do fasting labs at that time.    Managing Your Hypertension Hypertension is commonly called high blood pressure. Blood pressure is a measurement of how strongly your blood is pressing against the walls of your arteries. Arteries are blood vessels that carry blood from your heart throughout your body. Blood pressure does not stay the same. It rises when you are active, excited, or nervous. It lowers when you are sleeping or relaxed. If the numbers that measure your blood pressure stay above normal most of the time, you are at risk for health problems. Hypertension is a long-term (chronic) condition in which blood pressure is elevated. This condition often has no signs or symptoms. The cause of the condition is usually not known. What are blood pressure readings? A blood pressure reading is recorded as two numbers, such as "120 over 80" (or 120/80). The first ("top") number is called the systolic pressure. It is a measure of the pressure in your arteries as the heart beats. The second ("bottom") number is called the diastolic pressure. It is a measure of the pressure in your arteries as the heart relaxes between beats. What does my blood pressure reading mean? Blood pressure is classified into four stages. Based on your blood pressure reading, your health care provider may use the following stages to determine what type of treatment, if any, is needed. Systolic pressure and diastolic pressure are measured in a unit called mm Hg. Normal  Systolic pressure: below 123456.  Diastolic pressure: below 80. Prehypertension  Systolic pressure: 123456.  Diastolic pressure: [id number]. Hypertension stage 1  Systolic pressure: A999333.  Diastolic pressure:  A999333. Hypertension stage 2  Systolic pressure: 0000000 or above.  Diastolic pressure: 123XX123 or above. What health risks are associated with hypertension? Managing your hypertension is an important responsibility. Uncontrolled hypertension can lead to:  A heart attack.  A stroke.  A weakened blood vessel (aneurysm).  Heart failure.  Kidney damage.  Eye damage.  Metabolic syndrome.  Memory and concentration problems. What changes can I make to manage my hypertension? Hypertension can be managed effectively by making lifestyle changes and possibly by taking medicines. Your health care provider will help you come up with a plan to bring your blood pressure within a normal range. Your plan should include the following: Monitoring  Monitor your blood pressure at home as told by your health care provider. Your personal target blood pressure may vary depending on your medical conditions, your age, and other factors.  Have your blood pressure rechecked as told by your health care provider. Lifestyle  Lose weight if necessary.  Get at least 30-45 minutes of aerobic exercise at least 4 times a week.  Do not use any products that contain nicotine or tobacco, such as cigarettes and e-cigarettes. If you need help quitting, ask your health care provider.  Learn ways to reduce stress.  Control any chronic conditions, such as high cholesterol or diabetes. Eating and drinking  Follow the DASH diet. This diet is high in fruits, vegetables, and whole grains. It is low in salt, red meat, and added sugars.  Keep your sodium intake below 2,300 mg per day.  Limit alcoholic beverages. Communication  Review all the medicines you  take with your health care provider because there may be side effects or interactions.  Talk with your health care provider about your diet, exercise habits, and other lifestyle factors that may be contributing to hypertension.  See your health care provider  regularly. Your health care provider can help you create and adjust your plan for managing hypertension. Will I need medicine to control my blood pressure? Your health care provider may prescribe medicine if lifestyle changes are not enough to get your blood pressure under control, and if one of the following is true:  You are 45-29 years of age, and your systolic blood pressure is 140 or higher.  You are 5 years of age or older, and your systolic blood pressure is 150 or higher.  Your diastolic blood pressure is 90 or higher.  You have diabetes, and your systolic blood pressure is over [id number] or your diastolic blood pressure is over 90.  You have kidney disease, and your blood pressure is above 140/90.  You have heart disease or a history of stroke, and your blood pressure is 140/90 or higher. Take medicines only as told by your health care provider. Follow the directions carefully. Blood pressure medicines must be taken as prescribed. The medicine does not work as well when you skip doses. Skipping doses also puts you at risk for problems. Contact a health care provider if:  You think you are having a reaction to medicines you have taken.  You have repeated (recurrent) headaches.  You feel dizzy.  You have swelling in your ankles.  You have trouble with your vision. Get help right away if:  You develop a severe headache or confusion.  You have unusual weakness or numbness, or you feel faint.  You have severe pain in your chest or abdomen.  You vomit repeatedly.  You have trouble breathing. This information is not intended to replace advice given to you by your health care provider. Make sure you discuss any questions you have with your health care provider. Document Released: 06/03/2012 Document Revised: 05/14/2016 Document Reviewed: 12/08/2015 Elsevier Interactive Patient Education  2017 [employer].

## 2016-08-27 NOTE — Assessment & Plan Note (Signed)
Pending lipid panel 

## 2016-08-28 ENCOUNTER — Other Ambulatory Visit: Payer: Self-pay | Admitting: Family

## 2016-08-28 ENCOUNTER — Telehealth: Payer: Self-pay | Admitting: *Deleted

## 2016-08-28 DIAGNOSIS — E0590 Thyrotoxicosis, unspecified without thyrotoxic crisis or storm: Secondary | ICD-10-CM

## 2016-08-28 DIAGNOSIS — I10 Essential (primary) hypertension: Secondary | ICD-10-CM

## 2016-08-28 DIAGNOSIS — E785 Hyperlipidemia, unspecified: Secondary | ICD-10-CM

## 2016-08-28 LAB — HEPATITIS C ANTIBODY: HCV Ab: NEGATIVE

## 2016-08-28 LAB — HIV ANTIBODY (ROUTINE TESTING W REFLEX): HIV 1&2 Ab, 4th Generation: NONREACTIVE

## 2016-08-28 NOTE — Telephone Encounter (Signed)
Pt requested a lab results Pt contact [phone]

## 2016-08-29 NOTE — Telephone Encounter (Signed)
Tried calling patient. But patient has already been informed of results. Phone call was an error.

## 2016-09-26 ENCOUNTER — Other Ambulatory Visit: Payer: Self-pay | Admitting: *Deleted

## 2016-09-30 ENCOUNTER — Ambulatory Visit: Payer: Self-pay | Admitting: Gastroenterology

## 2016-10-28 ENCOUNTER — Encounter: Payer: PRIVATE HEALTH INSURANCE | Admitting: Family

## 2016-11-25 ENCOUNTER — Other Ambulatory Visit: Payer: Self-pay | Admitting: Family

## 2016-11-25 DIAGNOSIS — E785 Hyperlipidemia, unspecified: Secondary | ICD-10-CM

## 2016-11-25 DIAGNOSIS — I10 Essential (primary) hypertension: Secondary | ICD-10-CM

## 2017-02-10 ENCOUNTER — Encounter: Payer: PRIVATE HEALTH INSURANCE | Admitting: Family

## 2017-02-12 ENCOUNTER — Encounter: Payer: Self-pay | Admitting: Family

## 2017-02-12 ENCOUNTER — Other Ambulatory Visit (HOSPITAL_COMMUNITY)
Admission: RE | Admit: 2017-02-12 | Discharge: 2017-02-12 | Disposition: A | Discharge status: Home / Self Care | Payer: BLUE CROSS/BLUE SHIELD | Source: Ambulatory Visit | Attending: Family | Admitting: Family

## 2017-02-12 ENCOUNTER — Ambulatory Visit (INDEPENDENT_AMBULATORY_CARE_PROVIDER_SITE_OTHER): Payer: BLUE CROSS/BLUE SHIELD | Admitting: Family

## 2017-02-12 VITALS — BP 160/100 | HR 66 | Temp 97.7°F | Resp 12 | Ht 65.0 in | Wt 143.2 lb

## 2017-02-12 DIAGNOSIS — Z0001 Encounter for general adult medical examination with abnormal findings: Secondary | ICD-10-CM

## 2017-02-12 DIAGNOSIS — I10 Essential (primary) hypertension: Secondary | ICD-10-CM

## 2017-02-12 DIAGNOSIS — Z0000 Encounter for general adult medical examination without abnormal findings: Secondary | ICD-10-CM | POA: Insufficient documentation

## 2017-02-12 DIAGNOSIS — Z1231 Encounter for screening mammogram for malignant neoplasm of breast: Secondary | ICD-10-CM | POA: Diagnosis not present

## 2017-02-12 DIAGNOSIS — J302 Other seasonal allergic rhinitis: Secondary | ICD-10-CM | POA: Diagnosis not present

## 2017-02-12 DIAGNOSIS — Z1239 Encounter for other screening for malignant neoplasm of breast: Secondary | ICD-10-CM

## 2017-02-12 LAB — COMPREHENSIVE METABOLIC PANEL
ALT: 6 U/L (ref 0–35)
AST: 12 U/L (ref 0–37)
Albumin: 4.2 g/dL (ref 3.5–5.2)
Alkaline Phosphatase: 77 U/L (ref 39–117)
BILIRUBIN TOTAL: 0.3 mg/dL (ref 0.2–1.2)
BUN: 17 mg/dL (ref 6–23)
CALCIUM: 9.4 mg/dL (ref 8.4–10.5)
CHLORIDE: 102 meq/L (ref 96–112)
CO2: 28 meq/L (ref 19–32)
Creatinine, Ser: 0.7 mg/dL (ref 0.40–1.20)
GFR: 110.71 mL/min (ref >60.00)
GLUCOSE: 85 mg/dL (ref 70–99)
POTASSIUM: 3.9 meq/L (ref 3.5–5.1)
Sodium: 136 mEq/L (ref 135–145)
Total Protein: 7.1 g/dL (ref 6.0–8.3)

## 2017-02-12 LAB — HEMOGLOBIN A1C: Hgb A1c MFr Bld: 6.2 % (ref 4.6–6.5)

## 2017-02-12 LAB — LIPID PANEL
CHOL/HDL RATIO: 4
CHOLESTEROL: 214 mg/dL — AB (ref 0–200)
HDL: 49.5 mg/dL (ref >39.00)
LDL CALC: 140 mg/dL — AB (ref 0–99)
NonHDL: 164.9
TRIGLYCERIDES: 123 mg/dL (ref 0.0–149.0)
VLDL: 24.6 mg/dL (ref 0.0–40.0)

## 2017-02-12 LAB — CBC WITH DIFFERENTIAL/PLATELET
BASOS ABS: 0.1 10*3/uL (ref 0.0–0.1)
BASOS PCT: 0.7 % (ref 0.0–3.0)
Eosinophils Absolute: 0.2 10*3/uL (ref 0.0–0.7)
Eosinophils Relative: 2.7 % (ref 0.0–5.0)
HEMATOCRIT: 40 % (ref 36.0–46.0)
Hemoglobin: 13.3 g/dL (ref 12.0–15.0)
LYMPHS ABS: 3.6 10*3/uL (ref 0.7–4.0)
LYMPHS PCT: 48.8 % — AB (ref 12.0–46.0)
MCHC: 33.2 g/dL (ref 30.0–36.0)
MCV: 92.1 fl (ref 78.0–100.0)
Monocytes Absolute: 0.5 10*3/uL (ref 0.1–1.0)
Monocytes Relative: 7.1 % (ref 3.0–12.0)
NEUTROS ABS: 3 10*3/uL (ref 1.4–7.7)
NEUTROS PCT: 40.7 % — AB (ref 43.0–77.0)
PLATELETS: 283 10*3/uL (ref 150.0–400.0)
RBC: 4.35 Mil/uL (ref 3.87–5.11)
RDW: 14.5 % (ref 11.5–15.5)
WBC: 7.5 10*3/uL (ref 4.0–10.5)

## 2017-02-12 LAB — TSH: TSH: 0.2 u[IU]/mL — ABNORMAL LOW (ref 0.35–4.50)

## 2017-02-12 LAB — VITAMIN D 25 HYDROXY (VIT D DEFICIENCY, FRACTURES): VITD: 13.25 ng/mL — AB (ref 30.00–100.00)

## 2017-02-12 MED ORDER — HYDROCHLOROTHIAZIDE 12.5 MG PO CAPS: 12.5000 mg | ORAL_CAPSULE | Freq: Every day | ORAL | 0 refills | Status: DC

## 2017-02-12 MED ORDER — MONTELUKAST SODIUM 10 MG PO TABS: 10.0000 mg | ORAL_TABLET | Freq: Every day | ORAL | 3 refills | Status: DC

## 2017-02-12 NOTE — Assessment & Plan Note (Signed)
Uncontrolled. Start Singulair. Follow-up in 3 months

## 2017-02-12 NOTE — Patient Instructions (Signed)
Labs today  Start hctz, singulair  Monitor BP goal < 135/85  Lab appointment on Monday  F/u appt in 2 months - sooner if blood pressure not controlled.   Health Maintenance for Postmenopausal Women Menopause is a normal process in which your reproductive ability comes to an end. This process happens gradually over a span of months to years, usually between the ages of 17 and 17. Menopause is complete when you have missed 12 consecutive menstrual periods. It is important to talk with your health care provider about some of the most common conditions that affect postmenopausal women, such as heart disease, cancer, and bone loss (osteoporosis). Adopting a healthy lifestyle and getting preventive care can help to promote your health and wellness. Those actions can also lower your chances of developing some of these common conditions. What should I know about menopause? During menopause, you may experience a number of symptoms, such as:  Moderate-to-severe hot flashes.  Night sweats.  Decrease in sex drive.  Mood swings.  Headaches.  Tiredness.  Irritability.  Memory problems.  Insomnia. Choosing to treat or not to treat menopausal changes is an individual decision that you make with your health care provider. What should I know about hormone replacement therapy and supplements? Hormone therapy products are effective for treating symptoms that are associated with menopause, such as hot flashes and night sweats. Hormone replacement carries certain risks, especially as you become older. If you are thinking about using estrogen or estrogen with progestin treatments, discuss the benefits and risks with your health care provider. What should I know about heart disease and stroke? Heart disease, heart attack, and stroke become more likely as you age. This may be due, in part, to the hormonal changes that your body experiences during menopause. These can affect how your body processes  dietary fats, triglycerides, and cholesterol. Heart attack and stroke are both medical emergencies. There are many things that you can do to help prevent heart disease and stroke:  Have your blood pressure checked at least every 1-2 years. High blood pressure causes heart disease and increases the risk of stroke.  If you are 36-20 years old, ask your health care provider if you should take aspirin to prevent a heart attack or a stroke.  Do not use any tobacco products, including cigarettes, chewing tobacco, or electronic cigarettes. If you need help quitting, ask your health care provider.  It is important to eat a healthy diet and maintain a healthy weight.  Be sure to include plenty of vegetables, fruits, low-fat dairy products, and lean protein.  Avoid eating foods that are high in solid fats, added sugars, or salt (sodium).  Get regular exercise. This is one of the most important things that you can do for your health.  Try to exercise for at least 150 minutes each week. The type of exercise that you do should increase your heart rate and make you sweat. This is known as moderate-intensity exercise.  Try to do strengthening exercises at least twice each week. Do these in addition to the moderate-intensity exercise.  Know your numbers.Ask your health care provider to check your cholesterol and your blood glucose. Continue to have your blood tested as directed by your health care provider. What should I know about cancer screening? There are several types of cancer. Take the following steps to reduce your risk and to catch any cancer development as early as possible. Breast Cancer  Practice breast self-awareness.  This means understanding how your breasts  normally appear and feel.  It also means doing regular breast self-exams. Let your health care provider know about any changes, no matter how small.  If you are 31 or older, have a clinician do a breast exam (clinical breast exam  or CBE) every year. Depending on your age, family history, and medical history, it may be recommended that you also have a yearly breast X-ray (mammogram).  If you have a family history of breast cancer, talk with your health care provider about genetic screening.  If you are at high risk for breast cancer, talk with your health care provider about having an MRI and a mammogram every year.  Breast cancer (BRCA) gene test is recommended for women who have family members with BRCA-related cancers. Results of the assessment will determine the need for genetic counseling and BRCA1 and for BRCA2 testing. BRCA-related cancers include these types:  Breast. This occurs in males or females.  Ovarian.  Tubal. This may also be called fallopian tube cancer.  Cancer of the abdominal or pelvic lining (peritoneal cancer).  Prostate.  Pancreatic. Cervical, Uterine, and Ovarian Cancer  Your health care provider may recommend that you be screened regularly for cancer of the pelvic organs. These include your ovaries, uterus, and vagina. This screening involves a pelvic exam, which includes checking for microscopic changes to the surface of your cervix (Pap test).  For women ages 21-65, health care providers may recommend a pelvic exam and a Pap test every three years. For women ages 27-65, they may recommend the Pap test and pelvic exam, combined with testing for human papilloma virus (HPV), every five years. Some types of HPV increase your risk of cervical cancer. Testing for HPV may also be done on women of any age who have unclear Pap test results.  Other health care providers may not recommend any screening for nonpregnant women who are considered low risk for pelvic cancer and have no symptoms. Ask your health care provider if a screening pelvic exam is right for you.  If you have had past treatment for cervical cancer or a condition that could lead to cancer, you need Pap tests and screening for cancer  for at least 20 years after your treatment. If Pap tests have been discontinued for you, your risk factors (such as having a new sexual partner) need to be reassessed to determine if you should start having screenings again. Some women have medical problems that increase the chance of getting cervical cancer. In these cases, your health care provider may recommend that you have screening and Pap tests more often.  If you have a family history of uterine cancer or ovarian cancer, talk with your health care provider about genetic screening.  If you have vaginal bleeding after reaching menopause, tell your health care provider.  There are currently no reliable tests available to screen for ovarian cancer. Lung Cancer  Lung cancer screening is recommended for adults 84-26 years old who are at high risk for lung cancer because of a history of smoking. A yearly low-dose CT scan of the lungs is recommended if you:  Currently smoke.  Have a history of at least 30 pack-years of smoking and you currently smoke or have quit within the past 15 years. A pack-year is smoking an average of one pack of cigarettes per day for one year. Yearly screening should:  Continue until it has been 15 years since you quit.  Stop if you develop a health problem that would prevent you  from having lung cancer treatment. Colorectal Cancer  This type of cancer can be detected and can often be prevented.  Routine colorectal cancer screening usually begins at age 67 and continues through age 17.  If you have risk factors for colon cancer, your health care provider may recommend that you be screened at an earlier age.  If you have a family history of colorectal cancer, talk with your health care provider about genetic screening.  Your health care provider may also recommend using home test kits to check for hidden blood in your stool.  A small camera at the end of a tube can be used to examine your colon directly  (sigmoidoscopy or colonoscopy). This is done to check for the earliest forms of colorectal cancer.  Direct examination of the colon should be repeated every 5-10 years until age 36. However, if early forms of precancerous polyps or small growths are found or if you have a family history or genetic risk for colorectal cancer, you may need to be screened more often. Skin Cancer  Check your skin from head to toe regularly.  Monitor any moles. Be sure to tell your health care provider:  About any new moles or changes in moles, especially if there is a change in a mole's shape or color.  If you have a mole that is larger than the size of a pencil eraser.  If any of your family members has a history of skin cancer, especially at a young age, talk with your health care provider about genetic screening.  Always use sunscreen. Apply sunscreen liberally and repeatedly throughout the day.  Whenever you are outside, protect yourself by wearing long sleeves, pants, a wide-brimmed hat, and sunglasses. What should I know about osteoporosis? Osteoporosis is a condition in which bone destruction happens more quickly than new bone creation. After menopause, you may be at an increased risk for osteoporosis. To help prevent osteoporosis or the bone fractures that can happen because of osteoporosis, the following is recommended:  If you are 73-21 years old, get at least 1,000 mg of calcium and at least 600 mg of vitamin D per day.  If you are older than age 48 but younger than age 43, get at least 1,200 mg of calcium and at least 600 mg of vitamin D per day.  If you are older than age 74, get at least 1,200 mg of calcium and at least 800 mg of vitamin D per day. Smoking and excessive alcohol intake increase the risk of osteoporosis. Eat foods that are rich in calcium and vitamin D, and do weight-bearing exercises several times each week as directed by your health care provider. What should I know about how  menopause affects my mental health? Depression may occur at any age, but it is more common as you become older. Common symptoms of depression include:  Low or sad mood.  Changes in sleep patterns.  Changes in appetite or eating patterns.  Feeling an overall lack of motivation or enjoyment of activities that you previously enjoyed.  Frequent crying spells. Talk with your health care provider if you think that you are experiencing depression. What should I know about immunizations? It is important that you get and maintain your immunizations. These include:  Tetanus, diphtheria, and pertussis (Tdap) booster vaccine.  Influenza every year before the flu season begins.  Pneumonia vaccine.  Shingles vaccine. Your health care provider may also recommend other immunizations. This information is not intended to replace advice given  to you by your health care provider. Make sure you discuss any questions you have with your health care provider. Document Released: 11/01/2005 Document Revised: 03/29/2016 Document Reviewed: 06/13/2015 Elsevier Interactive Patient Education  2017 [employer].

## 2017-02-12 NOTE — Assessment & Plan Note (Signed)
Not a goal. Start hydrochlorothiazide. Recheck BMP in 5 days.

## 2017-02-12 NOTE — Progress Notes (Signed)
Subjective:    Patient ID: Brooke Murphy, female    DOB: [birth year], 56 y.o.   MRN: [id number]  CC: [name] is a 56 y.o. female who presents today for physical exam.    HPI: Feeling well.  No complaints.  HTN- compliant with medication. 'running high.' Denies exertional chest pain or pressure, numbness or tingling radiating to left arm or jaw, palpitations, dizziness, frequent headaches, changes in vision, or shortness of breath.   Allergies- claritin not working well. Constant nasal drip. Uses flonase which 'dries nose out. '       Colorectal Cancer Screening: UTD 2013 showed polyps;due in 5 years, ordered Breast Cancer Screening: Mammogram due, scheduled Cervical Cancer Screening: due Bone Health screening/DEXA for 65+: No increased fracture risk. Defer screening at this time. Lung Cancer Screening: Doesn't have 30 year pack year history and age > 56 years.       Tetanus -done 2016 per patient         Labs: Screening labs today. Exercise: Gets regular exercise.  Alcohol use: none Smoking/tobacco use: Nonsmoker.  Wears seat belt: Yes. Skin : no concerning lesions  HISTORY:  Past Medical History:  Diagnosis Date  . Brain aneurysm 1999   [facility]  . Hyperlipidemia   . Hypertension   . Rheumatoid arthritis ([place]) 2002   s/p methotrexate, humira and embrel  . Ulcer of abdomen wall [facility])     Past Surgical History:  Procedure Laterality Date  . ABDOMINAL HYSTERECTOMY  2005   ovaries remain  . BRAIN SURGERY Left 11/27/1997   aneurysm   Family History  Problem Relation Age of Onset  . Hypertension Mother   . Aneurysm Mother 38       died  . Hyperlipidemia Sister   . Hypertension Sister   . Cancer Father 70       throat cancer  . Diabetes Maternal Aunt   . Breast cancer Maternal Aunt 65  . Hypertension Maternal Grandmother   . Stroke Maternal Grandmother   . Colon cancer Neg Hx       ALLERGIES: Celebrex [celecoxib]; Lodine [etodolac]; and  Penicillins  Current Outpatient Prescriptions on File Prior to Visit  Medication Sig Dispense Refill  . bimatoprost (LUMIGAN) 0.01 % SOLN Place 1 drop into both eyes at bedtime.    . cyclobenzaprine (FLEXERIL) 10 MG tablet Take 1 tablet (10 mg total) by mouth 3 (three) times daily as needed for muscle spasms. 90 tablet 0  . fluticasone (FLONASE) 50 MCG/ACT nasal spray Place 2 sprays into both nostrils daily. 16 g 6  . gabapentin (NEURONTIN) 100 MG capsule Take 100 mg by mouth 3 (three) times daily.  0  . lisinopril (PRINIVIL,ZESTRIL) 40 MG tablet TAKE 1 TABLET (40 MG TOTAL) BY MOUTH DAILY. 90 tablet 1  . loratadine (CLARITIN) 10 MG tablet Take 1 tablet (10 mg total) by mouth daily. 90 tablet 1  . metoprolol tartrate (LOPRESSOR) 25 MG tablet TAKE 1 TABLET (25 MG TOTAL) BY MOUTH 2 (TWO) TIMES DAILY. 180 tablet 4  . mirtazapine (REMERON) 30 MG tablet Take 1 tablet (30 mg total) by mouth at bedtime. 90 tablet 1  . aspirin EC 81 MG tablet Take 81 mg by mouth daily.     No current facility-administered medications on file prior to visit.     Social History  Substance Use Topics  . Smoking status: Former Smoker    Quit date: 01/23/2015  . Smokeless tobacco: Never Used  . Alcohol use No  Review of Systems  Constitutional: Negative for chills, fever and unexpected weight change.  HENT: Positive for postnasal drip. Negative for congestion.   Eyes: Negative for visual disturbance.  Respiratory: Negative for cough.   Cardiovascular: Negative for chest pain, palpitations and leg swelling.  Gastrointestinal: Negative for nausea and vomiting.  Musculoskeletal: Negative for arthralgias and myalgias.  Skin: Negative for rash.  Neurological: Negative for dizziness and headaches.  Hematological: Negative for adenopathy.  Psychiatric/Behavioral: Negative for confusion.      Objective:    BP (!) 160/100   Pulse 66   Temp 97.7 F (36.5 C) (Oral)   Resp 12   Ht 5\' 5"  (1.651 m)   Wt 143 lb 3.2  oz (65 kg)   SpO2 95%   BMI 23.83 kg/m   BP Readings from Last 3 Encounters:  02/12/17 (!) 160/100  08/27/16 (!) 160/100  07/28/16 (!) 166/99   Wt Readings from Last 3 Encounters:  02/12/17 143 lb 3.2 oz (65 kg)  08/27/16 133 lb 3.2 oz (60.4 kg)  07/28/16 140 lb (63.5 kg)    Physical Exam  Constitutional: She appears well-developed and well-nourished.  Eyes: Conjunctivae are normal.  Neck: No thyroid mass and no thyromegaly present.  Cardiovascular: Normal rate, regular rhythm, normal heart sounds and normal pulses.   Pulmonary/Chest: Effort normal and breath sounds normal. She has no wheezes. She has no rhonchi. She has no rales. Right breast exhibits no inverted nipple, no mass, no nipple discharge, no skin change and no tenderness. Left breast exhibits no inverted nipple, no mass, no nipple discharge, no skin change and no tenderness. Breasts are symmetrical.  No masses or asymmetry appreciated during CBE.  Genitourinary: Uterus is not enlarged, not fixed and not tender. Cervix exhibits no motion tenderness, no discharge and no friability. Right adnexum displays no mass, no tenderness and no fullness. Left adnexum displays no mass, no tenderness and no fullness.  Genitourinary Comments: Pap performed. No CMT. Unable to appreciated ovaries.  Lymphadenopathy:       Head (right side): No submental, no submandibular, no tonsillar, no preauricular, no posterior auricular and no occipital adenopathy present.       Head (left side): No submental, no submandibular, no tonsillar, no preauricular, no posterior auricular and no occipital adenopathy present.       Right cervical: No superficial cervical, no deep cervical and no posterior cervical adenopathy present.      Left cervical: No superficial cervical, no deep cervical and no posterior cervical adenopathy present.    She has no axillary adenopathy.       Right axillary: No pectoral and no lateral adenopathy present.       Left  axillary: No pectoral and no lateral adenopathy present. Neurological: She is alert.  Skin: Skin is warm and dry.  Psychiatric: She has a normal mood and affect. Her speech is normal and behavior is normal. Thought content normal.  Vitals reviewed.      Assessment & Plan:   Problem List Items Addressed This Visit      Cardiovascular and Mediastinum   HTN (hypertension)    Not a goal. Start hydrochlorothiazide. Recheck BMP in 5 days.      Relevant Medications   hydrochlorothiazide (MICROZIDE) 12.5 MG capsule     Respiratory   Allergic rhinitis    Uncontrolled. Start Singulair. Follow-up in 3 months      Relevant Medications   montelukast (SINGULAIR) 10 MG tablet     Other  Routine physical examination - Primary    Due colonscopy; scheduled mammogram. Screening labs today. Encouraged exercise.       Relevant Orders   Ambulatory referral to Gastroenterology   MM SCREENING BREAST TOMO BILATERAL   CBC with Differential/Platelet (Completed)   Comprehensive metabolic panel (Completed)   Hemoglobin A1c (Completed)   Lipid panel (Completed)   Cytology - PAP   TSH (Completed)   VITAMIN D 25 Hydroxy (Vit-D Deficiency, Fractures) (Completed)    Other Visit Diagnoses    Screening for breast cancer       Relevant Orders   MM DIGITAL SCREENING BILATERAL   MM SCREENING BREAST TOMO BILATERAL       I have discontinued Ms. Fiveash's omeprazole. I am also having her start on montelukast and hydrochlorothiazide. Additionally, I am having her maintain her aspirin EC, bimatoprost, fluticasone, cyclobenzaprine, lisinopril, metoprolol tartrate, mirtazapine, loratadine, and gabapentin.   Meds ordered this encounter  Medications  . montelukast (SINGULAIR) 10 MG tablet    Sig: Take 1 tablet (10 mg total) by mouth at bedtime.    Dispense:  30 tablet    Refill:  3    Order Specific Question:   Supervising Provider    Answer:   Deborra Medina L [[id number]]  . hydrochlorothiazide  (MICROZIDE) 12.5 MG capsule    Sig: Take 1 capsule (12.5 mg total) by mouth daily.    Dispense:  90 capsule    Refill:  0    Order Specific Question:   Supervising Provider    Answer:   Crecencio Mc [[id number]]    Return precautions given.   Risks, benefits, and alternatives of the medications and treatment plan prescribed today were discussed, and patient expressed understanding.   Education regarding symptom management and diagnosis given to patient on AVS.   Continue to follow with Burnard Hawthorne, FNP for routine health maintenance.   Talmadge Coventry and I agreed with plan.   [name], FNP

## 2017-02-12 NOTE — Progress Notes (Signed)
Pre-visit discussion using our clinic review tool. No additional management support is needed unless otherwise documented below in the visit note.  

## 2017-02-13 ENCOUNTER — Other Ambulatory Visit: Payer: Self-pay | Admitting: Family

## 2017-02-13 DIAGNOSIS — I10 Essential (primary) hypertension: Secondary | ICD-10-CM

## 2017-02-13 DIAGNOSIS — E785 Hyperlipidemia, unspecified: Secondary | ICD-10-CM

## 2017-02-13 DIAGNOSIS — E0590 Thyrotoxicosis, unspecified without thyrotoxic crisis or storm: Secondary | ICD-10-CM

## 2017-02-13 MED ORDER — ATORVASTATIN CALCIUM 20 MG PO TABS: 20.0000 mg | ORAL_TABLET | Freq: Every day | ORAL | 2 refills | Status: DC

## 2017-02-13 NOTE — Assessment & Plan Note (Signed)
Due colonscopy; scheduled mammogram. Screening labs today. Encouraged exercise.

## 2017-02-13 NOTE — Progress Notes (Signed)
Mailed results to patient

## 2017-02-18 ENCOUNTER — Other Ambulatory Visit: Payer: BLUE CROSS/BLUE SHIELD

## 2017-02-18 LAB — CYTOLOGY - PAP
Diagnosis: NEGATIVE
HPV: DETECTED — AB

## 2017-02-19 ENCOUNTER — Other Ambulatory Visit: Payer: Self-pay | Admitting: Family

## 2017-02-19 DIAGNOSIS — B977 Papillomavirus as the cause of diseases classified elsewhere: Secondary | ICD-10-CM

## 2017-02-23 ENCOUNTER — Other Ambulatory Visit: Payer: Self-pay | Admitting: Family

## 2017-02-23 DIAGNOSIS — I10 Essential (primary) hypertension: Secondary | ICD-10-CM

## 2017-02-23 DIAGNOSIS — J302 Other seasonal allergic rhinitis: Secondary | ICD-10-CM

## 2017-02-24 ENCOUNTER — Telehealth: Payer: Self-pay | Admitting: Family

## 2017-02-24 DIAGNOSIS — R252 Cramp and spasm: Secondary | ICD-10-CM

## 2017-02-24 NOTE — Telephone Encounter (Signed)
Left voice mail to call back 

## 2017-02-24 NOTE — Telephone Encounter (Signed)
Pt called and stated that she has started back on her Vitamin D and started cholesterol medication she has been experiencing more cramping in her legs and feet. She states that she drinking a lot of water. Please advise, thank you!  Call pt @ [phone]

## 2017-02-24 NOTE — Telephone Encounter (Signed)
Patient calls stating having cramping in lower part of calf and feet , when stretching. Patient describes it as muscle cramps feels like grab in muscle.  This was happening prior to medication changes and occurring more often .   She is drinking plenty of fluids water.   Only changes in medications are vitamin D and cholesterol medication. Please advise.

## 2017-02-25 NOTE — Telephone Encounter (Signed)
Call pt  Cramping is hard to treat  Can be r/t electrolytes - I have pended lab.   Advise plenty of water.  She may also take over the counter magnesium and see if this helps.   There is also some evidence that tonic water which has quinine helps with cramping.   If no improvement. Advise OV.

## 2017-02-25 NOTE — Telephone Encounter (Signed)
Patient was informed and also she set up lab appointment for tomorrow at 1500

## 2017-02-26 ENCOUNTER — Other Ambulatory Visit: Payer: BLUE CROSS/BLUE SHIELD

## 2017-03-05 ENCOUNTER — Ambulatory Visit: Payer: BLUE CROSS/BLUE SHIELD | Attending: Family

## 2017-03-06 ENCOUNTER — Encounter: Payer: Self-pay | Admitting: Family

## 2017-03-12 NOTE — Telephone Encounter (Signed)
Pt lvm requesting a refill of her claritin. She states that she needs it asap. Pt cb [phone]

## 2017-04-06 ENCOUNTER — Other Ambulatory Visit: Payer: Self-pay | Admitting: Family

## 2017-04-06 DIAGNOSIS — I10 Essential (primary) hypertension: Secondary | ICD-10-CM

## 2017-04-11 NOTE — Telephone Encounter (Signed)
Pt is requesting to have this refilled today.

## 2017-04-16 ENCOUNTER — Ambulatory Visit: Payer: BLUE CROSS/BLUE SHIELD | Attending: Family

## 2017-04-28 ENCOUNTER — Ambulatory Visit (INDEPENDENT_AMBULATORY_CARE_PROVIDER_SITE_OTHER): Payer: BLUE CROSS/BLUE SHIELD | Admitting: Family

## 2017-04-28 ENCOUNTER — Encounter: Payer: Self-pay | Admitting: Family

## 2017-04-28 DIAGNOSIS — E0590 Thyrotoxicosis, unspecified without thyrotoxic crisis or storm: Secondary | ICD-10-CM | POA: Diagnosis not present

## 2017-04-28 DIAGNOSIS — I10 Essential (primary) hypertension: Secondary | ICD-10-CM

## 2017-04-28 MED ORDER — HYDROCHLOROTHIAZIDE 12.5 MG PO CAPS: 12.5000 mg | ORAL_CAPSULE | Freq: Every day | ORAL | 1 refills | Status: DC

## 2017-04-28 MED ORDER — LISINOPRIL 40 MG PO TABS: ORAL_TABLET | ORAL | 1 refills | Status: DC

## 2017-04-28 NOTE — Assessment & Plan Note (Signed)
At goal. Continue current regimen. 

## 2017-04-28 NOTE — Patient Instructions (Addendum)
Labs today for thyroid  Blood pressure looks great!   Follow up in 3 months

## 2017-04-28 NOTE — Progress Notes (Signed)
Subjective:    Patient ID: Brooke Murphy, female    DOB: 07-Apr-1961, 56 y.o.   MRN: [id number]  CC: Brooke Murphy is a 56 y.o. female who presents today for follow up.   HPI: HTN- started on hctz at last visit. Ran out of medication 3 days ago  BP home averaging 126/76.   Denies exertional chest pain or pressure, numbness or tingling radiating to left arm or jaw, palpitations, dizziness, frequent headaches, changes in vision, or shortness of breath.   Limiting soda. Trying to drink more water.   Allergies- singulair 'working wanders' and congestion has improved.   H/o hypothyroidism-had been on synthroid. No h/o  nodules. No trouble swallowing, cold intolerance, weight gain, skin changes.        HISTORY:  Past Medical History:  Diagnosis Date  . Brain aneurysm 1999   [facility]  . Hyperlipidemia   . Hypertension   . Rheumatoid arthritis ([place]) 2002   s/p methotrexate, humira and embrel  . Ulcer of abdomen wall [facility])    Past Surgical History:  Procedure Laterality Date  . ABDOMINAL HYSTERECTOMY  2005   ovaries remain; WITH CERVIX  . BRAIN SURGERY Left 11/27/1997   aneurysm   Family History  Problem Relation Age of Onset  . Hypertension Mother   . Aneurysm Mother 49       died  . Hyperlipidemia Sister   . Hypertension Sister   . Cancer Father 70       throat cancer  . Diabetes Maternal Aunt   . Breast cancer Maternal Aunt 65  . Hypertension Maternal Grandmother   . Stroke Maternal Grandmother   . Colon cancer Neg Hx     Allergies: Celebrex [celecoxib]; Lodine [etodolac]; and Penicillins Current Outpatient Prescriptions on File Prior to Visit  Medication Sig Dispense Refill  . aspirin EC 81 MG tablet Take 81 mg by mouth daily.    Marland Kitchen atorvastatin (LIPITOR) 20 MG tablet Take 1 tablet (20 mg total) by mouth daily. 90 tablet 2  . bimatoprost (LUMIGAN) 0.01 % SOLN Place 1 drop into both eyes at bedtime.    . cyclobenzaprine (FLEXERIL) 10 MG tablet Take 1 tablet (10 mg  total) by mouth 3 (three) times daily as needed for muscle spasms. 90 tablet 0  . fluticasone (FLONASE) 50 MCG/ACT nasal spray Place 2 sprays into both nostrils daily. 16 g 6  . gabapentin (NEURONTIN) 100 MG capsule Take 100 mg by mouth 3 (three) times daily.  0  . loratadine (CLARITIN) 10 MG tablet TAKE 1 TABLET (10 MG TOTAL) BY MOUTH DAILY. 90 tablet 1  . metoprolol tartrate (LOPRESSOR) 25 MG tablet TAKE 1 TABLET (25 MG TOTAL) BY MOUTH 2 (TWO) TIMES DAILY. 180 tablet 4  . mirtazapine (REMERON) 30 MG tablet Take 1 tablet (30 mg total) by mouth at bedtime. 90 tablet 1  . montelukast (SINGULAIR) 10 MG tablet Take 1 tablet (10 mg total) by mouth at bedtime. 30 tablet 3   No current facility-administered medications on file prior to visit.     Social History  Substance Use Topics  . Smoking status: Former Smoker    Quit date: 01/23/2015  . Smokeless tobacco: Never Used  . Alcohol use No    Review of Systems  Constitutional: Negative for chills and fever.  HENT: Negative for trouble swallowing.   Respiratory: Negative for cough.   Cardiovascular: Negative for chest pain and palpitations.  Gastrointestinal: Negative for nausea and vomiting.  Endocrine: Negative for cold intolerance  and heat intolerance.      Objective:    BP 136/66   Pulse 68   Temp 98.3 F (36.8 C) (Oral)   Ht 5\' 5"  (1.651 m)   Wt 139 lb 12.8 oz (63.4 kg)   SpO2 97%   BMI 23.26 kg/m  BP Readings from Last 3 Encounters:  04/28/17 136/66  02/12/17 (!) 160/100  08/27/16 (!) 160/100   Wt Readings from Last 3 Encounters:  04/28/17 139 lb 12.8 oz (63.4 kg)  02/12/17 143 lb 3.2 oz (65 kg)  08/27/16 133 lb 3.2 oz (60.4 kg)    Physical Exam  Constitutional: She appears well-developed and well-nourished.  Eyes: Conjunctivae are normal.  Neck: No thyroid mass and no thyromegaly present.  Cardiovascular: Normal rate, regular rhythm, normal heart sounds and normal pulses.   Pulmonary/Chest: Effort normal and  breath sounds normal. She has no wheezes. She has no rhonchi. She has no rales.  Neurological: She is alert.  Skin: Skin is warm and dry.  Psychiatric: She has a normal mood and affect. Her speech is normal and behavior is normal. Thought content normal.  Vitals reviewed.      Assessment & Plan:   Problem List Items Addressed This Visit      Cardiovascular and Mediastinum   HTN (hypertension)    At goal. Continue current regimen.       Relevant Medications   lisinopril (PRINIVIL,ZESTRIL) 40 MG tablet   hydrochlorothiazide (MICROZIDE) 12.5 MG capsule     Endocrine   Hyperthyroidism    Pending repeat studies.           I am having Ms. Koudelka maintain her aspirin EC, bimatoprost, fluticasone, cyclobenzaprine, metoprolol tartrate, mirtazapine, gabapentin, montelukast, atorvastatin, loratadine, lisinopril, and hydrochlorothiazide.   Meds ordered this encounter  Medications  . lisinopril (PRINIVIL,ZESTRIL) 40 MG tablet    Sig: TAKE 1 TABLET (40 MG TOTAL) BY MOUTH DAILY.    Dispense:  90 tablet    Refill:  1  . hydrochlorothiazide (MICROZIDE) 12.5 MG capsule    Sig: Take 1 capsule (12.5 mg total) by mouth daily.    Dispense:  90 capsule    Refill:  1    Return precautions given.   Risks, benefits, and alternatives of the medications and treatment plan prescribed today were discussed, and patient expressed understanding.   Education regarding symptom management and diagnosis given to patient on AVS.  Continue to follow with Burnard Hawthorne, FNP for routine health maintenance.   Talmadge Coventry and I agreed with plan.   [name], FNP

## 2017-04-28 NOTE — Progress Notes (Signed)
Pre visit review using our clinic review tool, if applicable. No additional management support is needed unless otherwise documented below in the visit note. 

## 2017-04-29 DIAGNOSIS — E0590 Thyrotoxicosis, unspecified without thyrotoxic crisis or storm: Secondary | ICD-10-CM | POA: Insufficient documentation

## 2017-04-29 LAB — BASIC METABOLIC PANEL
BUN: 15 mg/dL (ref 6–23)
CALCIUM: 9.6 mg/dL (ref 8.4–10.5)
CO2: 27 mEq/L (ref 19–32)
Chloride: 102 mEq/L (ref 96–112)
Creatinine, Ser: 0.72 mg/dL (ref 0.40–1.20)
GFR: 107.09 mL/min (ref >60.00)
GLUCOSE: 110 mg/dL — AB (ref 70–99)
POTASSIUM: 4.2 meq/L (ref 3.5–5.1)
Sodium: 137 mEq/L (ref 135–145)

## 2017-04-29 LAB — T3, FREE: T3, Free: 3.2 pg/mL (ref 2.3–4.2)

## 2017-04-29 LAB — T4, FREE: Free T4: 0.96 ng/dL (ref 0.60–1.60)

## 2017-04-29 LAB — MAGNESIUM: Magnesium: 2.1 mg/dL (ref 1.5–2.5)

## 2017-04-29 NOTE — Assessment & Plan Note (Signed)
Pending repeat studies.

## 2017-04-30 ENCOUNTER — Other Ambulatory Visit: Payer: Self-pay | Admitting: Family

## 2017-04-30 DIAGNOSIS — E0590 Thyrotoxicosis, unspecified without thyrotoxic crisis or storm: Secondary | ICD-10-CM

## 2017-05-01 LAB — THYROTROPIN RECEPTOR AUTOABS: Thyrotropin Receptor Ab: <6 % (ref <=16.0)

## 2017-05-02 ENCOUNTER — Telehealth: Payer: Self-pay | Admitting: Family

## 2017-05-02 NOTE — Telephone Encounter (Signed)
Left message for patient to return call back. Patient needs to schedule ;ab appointment for a TSH. order is pending.

## 2017-05-02 NOTE — Telephone Encounter (Signed)
Pt called back returning your call. Thank you! °

## 2017-05-05 ENCOUNTER — Other Ambulatory Visit: Payer: BLUE CROSS/BLUE SHIELD

## 2017-05-10 ENCOUNTER — Other Ambulatory Visit: Payer: Self-pay | Admitting: Family

## 2017-05-10 DIAGNOSIS — I10 Essential (primary) hypertension: Secondary | ICD-10-CM

## 2017-05-31 ENCOUNTER — Other Ambulatory Visit: Payer: Self-pay | Admitting: Family

## 2017-05-31 DIAGNOSIS — J302 Other seasonal allergic rhinitis: Secondary | ICD-10-CM

## 2017-06-05 ENCOUNTER — Ambulatory Visit: Payer: BLUE CROSS/BLUE SHIELD | Admitting: Family

## 2017-06-05 NOTE — Progress Notes (Deleted)
Subjective:    Patient ID: Brooke Murphy, female    DOB: 04/23/1961, 55 y.o.   MRN: [id number]  CC: Brooke Murphy is a 56 y.o. female who presents today for an acute visit.    HPI: CC: sinus congestion  Repeat TSH    former smoker    HISTORY:  Past Medical History:  Diagnosis Date  . Brain aneurysm 1999   [facility]  . Hyperlipidemia   . Hypertension   . Rheumatoid arthritis ([place]) 2002   s/p methotrexate, humira and embrel  . Ulcer of abdomen wall [facility])    Past Surgical History:  Procedure Laterality Date  . ABDOMINAL HYSTERECTOMY  2005   ovaries remain; WITH CERVIX  . BRAIN SURGERY Left 11/27/1997   aneurysm   Family History  Problem Relation Age of Onset  . Hypertension Mother   . Aneurysm Mother 81       died  . Hyperlipidemia Sister   . Hypertension Sister   . Cancer Father 70       throat cancer  . Diabetes Maternal Aunt   . Breast cancer Maternal Aunt 65  . Hypertension Maternal Grandmother   . Stroke Maternal Grandmother   . Colon cancer Neg Hx     Allergies: Celebrex [celecoxib]; Lodine [etodolac]; and Penicillins Current Outpatient Prescriptions on File Prior to Visit  Medication Sig Dispense Refill  . aspirin EC 81 MG tablet Take 81 mg by mouth daily.    Marland Kitchen atorvastatin (LIPITOR) 20 MG tablet Take 1 tablet (20 mg total) by mouth daily. 90 tablet 2  . bimatoprost (LUMIGAN) 0.01 % SOLN Place 1 drop into both eyes at bedtime.    . cyclobenzaprine (FLEXERIL) 10 MG tablet Take 1 tablet (10 mg total) by mouth 3 (three) times daily as needed for muscle spasms. 90 tablet 0  . fluticasone (FLONASE) 50 MCG/ACT nasal spray Place 2 sprays into both nostrils daily. 16 g 6  . gabapentin (NEURONTIN) 100 MG capsule Take 100 mg by mouth 3 (three) times daily.  0  . hydrochlorothiazide (MICROZIDE) 12.5 MG capsule Take 1 capsule (12.5 mg total) by mouth daily. 90 capsule 1  . hydrochlorothiazide (MICROZIDE) 12.5 MG capsule TAKE 1 CAPSULE BY MOUTH EVERY DAY 90 capsule 0  .  lisinopril (PRINIVIL,ZESTRIL) 40 MG tablet TAKE 1 TABLET (40 MG TOTAL) BY MOUTH DAILY. 90 tablet 1  . loratadine (CLARITIN) 10 MG tablet TAKE 1 TABLET (10 MG TOTAL) BY MOUTH DAILY. 90 tablet 1  . metoprolol tartrate (LOPRESSOR) 25 MG tablet TAKE 1 TABLET (25 MG TOTAL) BY MOUTH 2 (TWO) TIMES DAILY. 180 tablet 4  . mirtazapine (REMERON) 30 MG tablet Take 1 tablet (30 mg total) by mouth at bedtime. 90 tablet 1  . montelukast (SINGULAIR) 10 MG tablet TAKE 1 TABLET BY MOUTH EVERYDAY AT BEDTIME 30 tablet 3   No current facility-administered medications on file prior to visit.     Social History  Substance Use Topics  . Smoking status: Former Smoker    Quit date: 01/23/2015  . Smokeless tobacco: Never Used  . Alcohol use No    Review of Systems    Objective:    There were no vitals taken for this visit.   Physical Exam     Assessment & Plan:      I am having Ms. Katzman maintain her aspirin EC, bimatoprost, fluticasone, cyclobenzaprine, metoprolol tartrate, mirtazapine, gabapentin, atorvastatin, loratadine, lisinopril, hydrochlorothiazide, hydrochlorothiazide, and montelukast.   No orders of the defined types were placed in  this encounter.   Return precautions given.   Risks, benefits, and alternatives of the medications and treatment plan prescribed today were discussed, and patient expressed understanding.   Education regarding symptom management and diagnosis given to patient on AVS.  Continue to follow with Burnard Hawthorne, FNP for routine health maintenance.   Talmadge Coventry and I agreed with plan.   [name], FNP

## 2017-09-11 ENCOUNTER — Other Ambulatory Visit: Payer: Self-pay | Admitting: Family

## 2017-09-11 DIAGNOSIS — I10 Essential (primary) hypertension: Secondary | ICD-10-CM

## 2017-11-17 ENCOUNTER — Ambulatory Visit (INDEPENDENT_AMBULATORY_CARE_PROVIDER_SITE_OTHER): Payer: BLUE CROSS/BLUE SHIELD | Admitting: Family

## 2017-11-17 ENCOUNTER — Encounter: Payer: Self-pay | Admitting: Family

## 2017-11-17 VITALS — BP 120/88 | HR 59 | Temp 98.2°F | Wt 139.1 lb

## 2017-11-17 DIAGNOSIS — R51 Headache: Secondary | ICD-10-CM | POA: Diagnosis not present

## 2017-11-17 DIAGNOSIS — G8929 Other chronic pain: Secondary | ICD-10-CM

## 2017-11-17 DIAGNOSIS — J3489 Other specified disorders of nose and nasal sinuses: Secondary | ICD-10-CM | POA: Diagnosis not present

## 2017-11-17 DIAGNOSIS — R519 Headache, unspecified: Secondary | ICD-10-CM | POA: Insufficient documentation

## 2017-11-17 MED ORDER — DOXYCYCLINE HYCLATE 100 MG PO TABS
100.0000 mg | ORAL_TABLET | Freq: Two times a day (BID) | ORAL | 0 refills | Status: DC
Start: 2017-11-17 — End: 2018-07-20

## 2017-11-17 NOTE — Assessment & Plan Note (Addendum)
Acute on chronic. Suspect worsened by suspected sinus infection. Normal neurologic exam today however I advised that I would like her to have stat CT head due to her history of aneurysm. She is adament that HA is improved today and feels similar to HA's in the past. She declines CT head. She agreed to let me know how HA was and certainly if any new or worsening symptoms, she would go to ED.

## 2017-11-17 NOTE — Assessment & Plan Note (Addendum)
Working diagnosis of sinus infection. Due to tenderness on exam, will start antibiotic therapy. Advised close monitoring.

## 2017-11-17 NOTE — Progress Notes (Signed)
Subjective:    Patient ID: Brooke Murphy, female    DOB: 07/11/1961, 57 y.o.   MRN: [id number]  CC: Brooke Murphy is a 57 y.o. female who presents today for follow up.   HPI:  Complains of left sided sinus pain, thick, green nasal congestion x one day, unchanged.   Pain over left side of eye and in left side of face with bending over. Tyelonol sinus severe without relief. Notes being lightheaded.No syncope, CP, SOB, cough.   Feels pressure on left side of face. NO fever. HA is not worse HA of life. No numbness, tingling. H/o headaches and HA feels similar to headaches in the past. HA is better today.   Started on gabapentin years ago, pain on scalp by neurology  No longer on flonase. On singulair.   Depression-stable on remeron. No thoughts of hurting herself or anyone else.   HTN- compliant with medications. At home, 120/70s. Denies exertional chest pain or pressure, numbness or tingling radiating to left arm or jaw, palpitations, dizziness, frequent headaches, changes in vision, or shortness of breath.    10/2017 GI - given GI cocktail, advised prilosec  GERD- Started back on on omeprazole. Had planned to have colonoscopy however missed appointment.    H/o aneurysm, s/p clips.       CT Head 07/2016- showed aneurysm clip near the left carotid terminus.      HISTORY:  Past Medical History:  Diagnosis Date  . Brain aneurysm 1999   [facility]  . Hyperlipidemia   . Hypertension   . Rheumatoid arthritis ([place]) 2002   s/p methotrexate, humira and embrel  . Ulcer of abdomen wall [facility])    Past Surgical History:  Procedure Laterality Date  . ABDOMINAL HYSTERECTOMY  2005   ovaries remain; WITH CERVIX  . BRAIN SURGERY Left 11/27/1997   aneurysm   Family History  Problem Relation Age of Onset  . Hypertension Mother   . Aneurysm Mother 73       died  . Hyperlipidemia Sister   . Hypertension Sister   . Cancer Father 70       throat cancer  . Diabetes Maternal Aunt   .  Breast cancer Maternal Aunt 65  . Hypertension Maternal Grandmother   . Stroke Maternal Grandmother   . Colon cancer Neg Hx     Allergies: Celebrex [celecoxib]; Lodine [etodolac]; and Penicillins Current Outpatient Medications on File Prior to Visit  Medication Sig Dispense Refill  . aspirin EC 81 MG tablet Take 81 mg by mouth daily.    Marland Kitchen atorvastatin (LIPITOR) 20 MG tablet Take 1 tablet (20 mg total) by mouth daily. 90 tablet 2  . bimatoprost (LUMIGAN) 0.01 % SOLN Place 1 drop into both eyes at bedtime.    . fluticasone (FLONASE) 50 MCG/ACT nasal spray Place 2 sprays into both nostrils daily. 16 g 6  . gabapentin (NEURONTIN) 100 MG capsule Take 100 mg by mouth 3 (three) times daily.  0  . hydrochlorothiazide (MICROZIDE) 12.5 MG capsule Take 1 capsule (12.5 mg total) by mouth daily. 90 capsule 1  . hydrochlorothiazide (MICROZIDE) 12.5 MG capsule TAKE 1 CAPSULE BY MOUTH EVERY DAY 90 capsule 0  . lisinopril (PRINIVIL,ZESTRIL) 40 MG tablet TAKE 1 TABLET (40 MG TOTAL) BY MOUTH DAILY. 90 tablet 1  . metoprolol tartrate (LOPRESSOR) 25 MG tablet TAKE 1 TABLET (25 MG TOTAL) BY MOUTH 2 (TWO) TIMES DAILY. NEED APPT FOR ADDITIONAL REFILLS 180 tablet 0  . mirtazapine (REMERON) 30 MG tablet Take  1 tablet (30 mg total) by mouth at bedtime. 90 tablet 1  . montelukast (SINGULAIR) 10 MG tablet TAKE 1 TABLET BY MOUTH EVERYDAY AT BEDTIME 30 tablet 3  . omeprazole (PRILOSEC) 20 MG capsule Take by mouth daily.  3  . cyclobenzaprine (FLEXERIL) 10 MG tablet Take 1 tablet (10 mg total) by mouth 3 (three) times daily as needed for muscle spasms. (Patient not taking: Reported on 11/17/2017) 90 tablet 0   No current facility-administered medications on file prior to visit.     Social History   Tobacco Use  . Smoking status: Former Smoker    Last attempt to quit: 01/23/2015    Years since quitting: 2.8  . Smokeless tobacco: Never Used  Substance Use Topics  . Alcohol use: No    Alcohol/week: 0.0 oz  . Drug use:  No    Review of Systems  Constitutional: Negative for chills and fever.  HENT: Positive for congestion, sinus pressure and sinus pain. Negative for ear pain, facial swelling, postnasal drip, rhinorrhea and sore throat.   Respiratory: Negative for cough, shortness of breath and wheezing.   Cardiovascular: Negative for chest pain and palpitations.  Gastrointestinal: Negative for nausea and vomiting.  Neurological: Positive for dizziness and headaches. Negative for syncope, weakness and numbness.      Objective:    BP 120/88   Pulse (!) 59   Temp 98.2 F (36.8 C) (Oral)   Wt 139 lb 2 oz (63.1 kg)   SpO2 98%   BMI 23.15 kg/m  BP Readings from Last 3 Encounters:  11/17/17 120/88  04/28/17 136/66  02/12/17 (!) 160/100   Wt Readings from Last 3 Encounters:  11/17/17 139 lb 2 oz (63.1 kg)  04/28/17 139 lb 12.8 oz (63.4 kg)  02/12/17 143 lb 3.2 oz (65 kg)    Physical Exam  Constitutional: She appears well-developed and well-nourished.  HENT:  Head: Normocephalic and atraumatic.  Right Ear: Hearing, tympanic membrane, external ear and ear canal normal. No swelling or tenderness. Tympanic membrane is not erythematous and not bulging. No middle ear effusion.  Left Ear: Tympanic membrane, external ear and ear canal normal. No swelling or tenderness. Tympanic membrane is not erythematous and not bulging.  No middle ear effusion.  Nose: No rhinorrhea. Right sinus exhibits no maxillary sinus tenderness and no frontal sinus tenderness. Left sinus exhibits maxillary sinus tenderness. Left sinus exhibits no frontal sinus tenderness.  Mouth/Throat: Uvula is midline, oropharynx is clear and moist and mucous membranes are normal. No posterior oropharyngeal edema or posterior oropharyngeal erythema.  Eyes: Conjunctivae, EOM and lids are normal. Pupils are equal, round, and reactive to light. Lids are everted and swept, no foreign bodies found.  Fundus normal bilaterally.   Cardiovascular: Normal  rate, regular rhythm, normal heart sounds and normal pulses.  Pulmonary/Chest: Effort normal and breath sounds normal. She has no wheezes. She has no rhonchi. She has no rales.  Lymphadenopathy:       Head (right side): No submental, no submandibular, no tonsillar, no preauricular, no posterior auricular and no occipital adenopathy present.       Head (left side): No submental, no submandibular, no tonsillar, no preauricular, no posterior auricular and no occipital adenopathy present.    She has no cervical adenopathy.       Right cervical: No superficial cervical, no deep cervical and no posterior cervical adenopathy present.      Left cervical: No superficial cervical, no deep cervical and no posterior cervical adenopathy present.  Neurological: She is alert. She has normal strength. No cranial nerve deficit or sensory deficit. She displays a negative Romberg sign.  Reflex Scores:      Bicep reflexes are 2+ on the right side and 2+ on the left side.      Patellar reflexes are 2+ on the right side and 2+ on the left side. Grip equal and strong bilateral upper extremities. Gait strong and steady. Able to perform rapid alternating movement without difficulty.   Skin: Skin is warm and dry.  Psychiatric: She has a normal mood and affect. Her speech is normal and behavior is normal. Thought content normal.  Vitals reviewed.      Assessment & Plan:   Problem List Items Addressed This Visit      Other   Headache    Acute on chronic. Suspect worsened by suspected sinus infection. Normal neurologic exam today however I advised that I would like her to have stat CT head due to her history of aneurysm. She is adament that HA is improved today and feels similar to HA's in the past. She declines CT head. She agreed to let me know how HA was and certainly if any new or worsening symptoms, she would go to ED.       Sinus pressure - Primary    Working diagnosis of sinus infection. Due to tenderness on  exam, will start antibiotic therapy. Advised close monitoring.       Relevant Medications   doxycycline (VIBRA-TABS) 100 MG tablet       I have discontinued Zenith Carrillo's loratadine. I am also having her start on doxycycline. Additionally, I am having her maintain her aspirin EC, bimatoprost, fluticasone, cyclobenzaprine, mirtazapine, gabapentin, atorvastatin, lisinopril, hydrochlorothiazide, hydrochlorothiazide, montelukast, metoprolol tartrate, and omeprazole.   Meds ordered this encounter  Medications  . doxycycline (VIBRA-TABS) 100 MG tablet    Sig: Take 1 tablet (100 mg total) by mouth 2 (two) times daily.    Dispense:  10 tablet    Refill:  0    Order Specific Question:   Supervising Provider    Answer:   Crecencio Mc [[id number]]    Return precautions given.   Risks, benefits, and alternatives of the medications and treatment plan prescribed today were discussed, and patient expressed understanding.   Education regarding symptom management and diagnosis given to patient on AVS.  Continue to follow with Burnard Hawthorne, FNP for routine health maintenance.   Talmadge Coventry and I agreed with plan.   [name], FNP

## 2017-11-17 NOTE — Patient Instructions (Addendum)
Please let me know how you are doing, with the headache and call us in the morning  Will trial antibiotic and see if relieves sinus pain, thus headache.   Monitor blood pressure,  Goal is less than 130/80; if persistently higher, please make sooner follow up appointment so we can recheck you blood pressure and manage medications  Please make f/u for physical.

## 2017-11-24 ENCOUNTER — Telehealth: Payer: Self-pay

## 2017-11-24 NOTE — Progress Notes (Signed)
Spoke with patient states she is feeling much better , antibiotic helped.

## 2017-11-25 NOTE — Telephone Encounter (Signed)
error 

## 2017-12-02 ENCOUNTER — Ambulatory Visit: Payer: BLUE CROSS/BLUE SHIELD | Admitting: Internal Medicine

## 2017-12-16 ENCOUNTER — Other Ambulatory Visit: Payer: Self-pay | Admitting: Family

## 2017-12-16 DIAGNOSIS — I10 Essential (primary) hypertension: Secondary | ICD-10-CM

## 2018-01-01 ENCOUNTER — Emergency Department: Payer: BLUE CROSS/BLUE SHIELD

## 2018-01-01 ENCOUNTER — Emergency Department
Admission: EM | Admit: 2018-01-01 | Discharge: 2018-01-01 | Disposition: A | Discharge status: Home / Self Care | Payer: BLUE CROSS/BLUE SHIELD | Attending: Emergency Medicine | Admitting: Emergency Medicine

## 2018-01-01 ENCOUNTER — Other Ambulatory Visit: Payer: Self-pay

## 2018-01-01 DIAGNOSIS — I10 Essential (primary) hypertension: Secondary | ICD-10-CM | POA: Diagnosis not present

## 2018-01-01 DIAGNOSIS — Z87891 Personal history of nicotine dependence: Secondary | ICD-10-CM | POA: Insufficient documentation

## 2018-01-01 DIAGNOSIS — R42 Dizziness and giddiness: Secondary | ICD-10-CM | POA: Insufficient documentation

## 2018-01-01 DIAGNOSIS — M069 Rheumatoid arthritis, unspecified: Secondary | ICD-10-CM | POA: Diagnosis not present

## 2018-01-01 LAB — CBC
HEMATOCRIT: 40.3 % (ref 35.0–47.0)
Hemoglobin: 13.8 g/dL (ref 12.0–16.0)
MCH: 31.2 pg (ref 26.0–34.0)
MCHC: 34.2 g/dL (ref 32.0–36.0)
MCV: 91.2 fL (ref 80.0–100.0)
Platelets: 312 10*3/uL (ref 150–440)
RBC: 4.42 MIL/uL (ref 3.80–5.20)
RDW: 14 % (ref 11.5–14.5)
WBC: 7.2 10*3/uL (ref 3.6–11.0)

## 2018-01-01 LAB — BASIC METABOLIC PANEL
Anion gap: 6 (ref 5–15)
BUN: 13 mg/dL (ref 6–20)
CHLORIDE: 102 mmol/L (ref 101–111)
CO2: 28 mmol/L (ref 22–32)
Calcium: 9.1 mg/dL (ref 8.9–10.3)
Creatinine, Ser: 0.81 mg/dL (ref 0.44–1.00)
GFR calc Af Amer: >60 mL/min (ref >60)
GFR calc non Af Amer: >60 mL/min (ref >60)
GLUCOSE: 104 mg/dL — AB (ref 65–99)
POTASSIUM: 3.6 mmol/L (ref 3.5–5.1)
SODIUM: 136 mmol/L (ref 135–145)

## 2018-01-01 LAB — URINALYSIS, COMPLETE (UACMP) WITH MICROSCOPIC
BACTERIA UA: NONE SEEN
BILIRUBIN URINE: NEGATIVE
Glucose, UA: NEGATIVE mg/dL
KETONES UR: NEGATIVE mg/dL
LEUKOCYTES UA: NEGATIVE
Nitrite: NEGATIVE
PROTEIN: NEGATIVE mg/dL
SPECIFIC GRAVITY, URINE: 1.016 (ref 1.005–1.030)
pH: 7 (ref 5.0–8.0)

## 2018-01-01 LAB — TROPONIN I

## 2018-01-01 LAB — TSH: TSH: 0.798 u[IU]/mL (ref 0.350–4.500)

## 2018-01-01 MED ORDER — SODIUM CHLORIDE 0.9 % IV BOLUS
1000.0000 mL | Freq: Once | INTRAVENOUS | Status: AC
  Administered 2018-01-01: 1000 mL via INTRAVENOUS

## 2018-01-01 MED ORDER — IOHEXOL 350 MG/ML SOLN
75.0000 mL | Freq: Once | INTRAVENOUS | Status: AC | PRN
  Administered 2018-01-01: 75 mL via INTRAVENOUS

## 2018-01-01 NOTE — ED Provider Notes (Signed)
[facility] Emergency Department Provider Note  ____________________________________________  Time seen: Approximately 8:25 AM  I have reviewed the triage vital signs and the nursing notes.   HISTORY  Chief Complaint Dizziness    HPI Brooke Murphy is a 57 y.o. female the history of cerebral aneurysm status post clip, HTN, HL, presenting with lightheadedness and dizziness.  The patient reports that for the past 3 days, she has had a lightheaded sensation every time she stands up.  When she sits down or lays back, she develops a room spinning dizzy sensation.  She has not had any nausea or vomiting, and reports a single episode of diarrhea 3 days ago but this has resolved and she is now having normal bowel movements.  She is not having any associated headache, numbness tingling or weakness.  Occasionally she has intermittent blurred vision.  She also reports some congestion and nonproductive cough which she attributes to seasonal allergies, which is chronic for her.  She has not had any recent trauma or changes in her medications.  She spoke with her PMD yesterday, who suggested oral hydration, and has had lots of water and Pedialyte but her symptoms worsened today, and she felt like she was staggering when she was walking.  Past Medical History:  Diagnosis Date  . Brain aneurysm 1999   [facility]  . Hyperlipidemia   . Hypertension   . Rheumatoid arthritis ([place]) 2002   s/p methotrexate, humira and embrel  . Ulcer of abdomen wall [facility])     Patient Active Problem List   Diagnosis Date Noted  . Headache 11/17/2017  . Sinus pressure 11/17/2017  . Hyperthyroidism 04/29/2017  . Routine physical examination 02/12/2017  . GERD (gastroesophageal reflux disease) 08/27/2016  . HLD (hyperlipidemia) 08/27/2016  . Depression 08/27/2016  . Right shoulder pain 06/15/2015  . Arthritis 04/08/2015  . HTN (hypertension) 12/10/2014  . Allergic rhinitis 12/10/2014  . Thoracic back  pain 12/10/2014    Past Surgical History:  Procedure Laterality Date  . ABDOMINAL HYSTERECTOMY  2005   ovaries remain; WITH CERVIX  . BRAIN SURGERY Left 11/27/1997   aneurysm    Current Outpatient Rx  . Order #: [id number] Class: Historical Med  . Order #: [id number] Class: Normal  . Order #: [id number] Class: Historical Med  . Order #: [id number] Class: Normal  . Order #: [id number] Class: Historical Med  . Order #: [id number] Class: Normal  . Order #: [id number] Class: Normal  . Order #: [id number] Class: Normal  . Order #: [id number] Class: Normal  . Order #: [id number] Class: Normal  . Order #: [id number] Class: Historical Med  . Order #: [id number] Class: Normal  . Order #: [id number] Class: Normal    Allergies Penicillins; Celebrex [celecoxib]; and Lodine [etodolac]  Family History  Problem Relation Age of Onset  . Hypertension Mother   . Aneurysm Mother 60       died  . Hyperlipidemia Sister   . Hypertension Sister   . Cancer Father 70       throat cancer  . Diabetes Maternal Aunt   . Breast cancer Maternal Aunt 65  . Hypertension Maternal Grandmother   . Stroke Maternal Grandmother   . Colon cancer Neg Hx     Social History Social History   Tobacco Use  . Smoking status: Former Smoker    Last attempt to quit: 01/23/2015    Years since quitting: 2.9  . Smokeless tobacco: Never Used  Substance Use Topics  . Alcohol use: No    Alcohol/week: 0.0 oz  . Drug use:  No    Review of Systems Constitutional: No fever/chills.  Lightheadedness.  Positive dizziness.  Negative syncope. Eyes: Intermittent blurred vision.  No diplopia. ENT: No sore throat. No congestion or rhinorrhea. Cardiovascular: Denies chest pain. Denies palpitations. Respiratory: Denies shortness of breath.  No cough. Gastrointestinal: No abdominal pain.  No nausea, no vomiting.  Single episode, now resolved, of diarrhea.  No constipation. Genitourinary: Negative for dysuria.  Urinary frequency. Musculoskeletal:  Negative for back pain.  No neck pain or stiffness. Skin: Negative for rash. Neurological: Negative for headaches. No focal numbness, tingling or weakness.  Difficulty walking due to lightheadedness.  Intermittent blurred vision.  No changes in mental status or speech.    ____________________________________________   PHYSICAL EXAM:  VITAL SIGNS: ED Triage Vitals  Enc Vitals Group     BP 01/01/18 0651 139/78     Pulse Rate 01/01/18 0651 74     Resp 01/01/18 0651 16     Temp 01/01/18 0651 98.3 F (36.8 C)     Temp Source 01/01/18 0651 Oral     SpO2 01/01/18 0651 100 %     Weight 01/01/18 0652 137 lb (62.1 kg)     Height 01/01/18 0652 5\' 5"  (1.651 m)     Head Circumference --      Peak Flow --      Pain Score 01/01/18 0657 0     Pain Loc --      Pain Edu? --      Excl. in [place]? --     Constitutional: Alert and oriented. Well appearing and in no acute distress. Answers questions appropriately.  Sitting comfortably in the stretcher without any acute distress. Eyes: Conjunctivae are normal.  EOMI. PERRLA.  No horizontal or vertical nystagmus.  No scleral icterus. Head: Atraumatic. Nose: No congestion/rhinnorhea. Mouth/Throat: Mucous membranes are moist.  Neck: No stridor.  Supple.  No JVD.  No meningismus. Cardiovascular: Normal rate, regular rhythm. No murmurs, rubs or gallops.  Respiratory: Normal respiratory effort.  No accessory muscle use or retractions. Lungs CTAB.  No wheezes, rales or ronchi. Gastrointestinal: Soft, nontender and nondistended.  No guarding or rebound.  No peritoneal signs. Musculoskeletal: No LE edema. No ttp in the calves or palpable cords.  Negative Homan's sign. Neurologic:  A&Ox3.  Speech is clear.  Face and smile are symmetric.  EOMI. PERRLA.  No horizontal or vertical nystagmus.  No pronator drift.  Moves all extremities well. Skin:  Skin is warm, dry and intact. No rash noted. Psychiatric: Mood and affect are normal. Speech and behavior are normal.   Normal judgement.  ____________________________________________   LABS (all labs ordered are listed, but only abnormal results are displayed)  Labs Reviewed  BASIC METABOLIC PANEL - Abnormal; Notable for the following components:      Result Value   Glucose, Bld 104 (*)    All other components within normal limits  URINALYSIS, COMPLETE (UACMP) WITH MICROSCOPIC - Abnormal; Notable for the following components:   Color, Urine YELLOW (*)    APPearance CLEAR (*)    Hgb urine dipstick SMALL (*)    Squamous Epithelial / LPF 0-5 (*)    All other components within normal limits  CBC  TROPONIN I  TSH   ____________________________________________  EKG  ED ECG REPORT I, Eula Listen, the attending physician, personally viewed and interpreted this ECG.   Date: 01/01/2018  EKG Time: 701  Rate: 67  Rhythm: normal sinus rhythm  Axis: normal  Intervals:none  ST&T Change: No  STEMI  ____________________________________________  RADIOLOGY  Ct Angio Head W Or Wo Contrast  Result Date: 01/01/2018 CLINICAL DATA:  57 year old female with dizziness for 1 week. Blurred vision. History of surgically treated left side intracranial aneurysm. EXAM: CT ANGIOGRAPHY HEAD AND NECK TECHNIQUE: Multidetector CT imaging of the head and neck was performed using the standard protocol during bolus administration of intravenous contrast. Multiplanar CT image reconstructions and MIPs were obtained to evaluate the vascular anatomy. Carotid stenosis measurements (when applicable) are obtained utilizing NASCET criteria, using the distal internal carotid diameter as the denominator. CONTRAST:  93mL OMNIPAQUE IOHEXOL 350 MG/ML SOLN COMPARISON:  Head CT without contrast 07/28/2016 and earlier. FINDINGS: CT HEAD Brain: Stable aneurysm clip related streak artifact at the left central skull base. Cerebral volume is stable and within normal limits. Scattered patchy cerebral white matter hypodensity appears stable  and is most apparent in the anterior frontal lobes. No midline shift, ventriculomegaly, mass effect, evidence of mass lesion, intracranial hemorrhage or evidence of cortically based acute infarction. Calvarium and skull base: Sequelae of left frontotemporal craniotomy and medial left skull base surgical aneurysm clip appear unchanged in configuration since 2017. No acute osseous abnormality identified. Paranasal sinuses: Clear. Orbits: No acute orbit or scalp soft tissue findings. CTA NECK Skeleton: No acute osseous abnormality identified. Cervical spine disc and endplate degeneration. Upper chest: Emphysema with severe apical bullous disease and architectural distortion. Mild superimposed atelectasis. No superior mediastinal lymphadenopathy. Other neck: Thyroid goiter with multiple bilateral thyroid nodules, the largest estimated at 2 centimeters in the medial right lobe on series 4, image 40. Otherwise negative neck soft tissues.  No cervical lymphadenopathy. Aortic arch: Minor arch atherosclerosis. Three vessel arch configuration. Right carotid system: Negative aside from mild tortuosity, and faint calcified plaque of the distal cervical right ICA just below the skull base. Left carotid system: Bulky low-density plaque at the posterior left ICA origin and bulb is noted measuring up to 8 millimeters in thickness, however, results in less than 50 % stenosis with respect to the distal vessel. Distally the cervical left ICA is normal aside from tortuosity. Vertebral arteries: No proximal right subclavian artery stenosis despite some soft plaque. The right vertebral artery is non dominant, and has a shared origin with a superior right thoracic branch. No proximal right vertebral stenosis. The right vertebral is patent but diminutive to the skull base. No proximal left subclavian artery stenosis despite soft and calcified plaque. Normal left vertebral artery origin. The left vertebral is dominant. There is mild soft  plaque in the left V1 segment without stenosis. The left vertebral artery remains patent to the skull base without stenosis. CTA HEAD Posterior circulation: The non dominant right vertebral artery remains patent to the vertebrobasilar junction. The distal left vertebral artery primarily supplies the basilar without stenosis. Normal left PICA origin. The right AICA is dominant. The vertebrobasilar junction and basilar arteries are tortuous without stenosis. The SCA and left PCA origin are normal. There is a fetal type right PCA origin. The left posterior communicating artery is diminutive or absent. The bilateral PCA branches are within normal limits. Anterior circulation: The left ICA siphon is patent with no atherosclerosis or stenosis identified. Normal left ophthalmic artery origin. There is a distal left ICA PCOM region aneurysm clip in place with no aneurysmal enhancement identified. The left ICA terminus, left MCA, and left ACA origins are otherwise within normal limits. The right ICA siphon is patent with mild tortuosity. No atherosclerosis or stenosis. The right posterior communicating artery and  right ophthalmic artery origins are normal. Patent right ICA terminus. Normal right MCA and ACA origins. The anterior communicating artery is diminutive. The bilateral ACA branches are within normal limits. The right MCA M1 segment, bifurcation, and right MCA branches are within normal limits. The left MCA M1 segment has an early bifurcation. The left MCA bifurcation is ectatic. There is a 2-3 millimeter anterior and superiorly infundibulum at the left MCA bifurcation best seen on series 8, image 146 and series 6, image 101. It appears and anterior sylvian M2 branch arises from this infundibulum. No saccular aneurysm identified, and the left MCA branches are otherwise within normal limits. Venous sinuses: Patent. Anatomic variants: Dominant left vertebral artery. Diminutive right vertebral artery which has a shared  origin with the superior right thoracic arterial branch. Fetal type right PCA origin. Delayed phase: No abnormal enhancement identified. Review of the MIP images confirms the above findings IMPRESSION: 1. Positive for bulky soft plaque in the posterior Left ICA origin and bulb, but no hemodynamically significant stenosis results. Otherwise minimal arterial atherosclerosis in the head and neck. 2. Sequelae of remote distal left ICA aneurysm surgical clipping with no adverse features. 3. No other intracranial aneurysm. Mildly ectatic left MCA bifurcation with small 2-3 mm infundibulum (normal variant). 4. No acute intracranial abnormality. Stable CT appearance of the brain since 2017. 5. Moderate to severe upper lobe Emphysema (ICD10-J43.9). 6. Thyroid goiter with a 2 cm right lobe nodule which meets consensus criteria for routine follow-up Thyroid Ultrasound characterization. Electronically Signed   By: Genevie Ann M.D.   On: 01/01/2018 11:17   Dg Chest 2 View  Result Date: 01/01/2018 CLINICAL DATA:  Dizziness for 3 days. EXAM: CHEST - 2 VIEW COMPARISON:  10/12/2014. FINDINGS: Normal heart size and mediastinal contours. Hyperinflation with biapical bullous change, representing COPD, stable. RIGHT upper lobe granuloma, stable. No effusion or pneumothorax. Bones unremarkable. IMPRESSION: COPD.  No active disease. Electronically Signed   By: Staci Righter M.D.   On: 01/01/2018 09:55   Ct Angio Neck W Or Wo Contrast  Result Date: 01/01/2018 CLINICAL DATA:  57 year old female with dizziness for 1 week. Blurred vision. History of surgically treated left side intracranial aneurysm. EXAM: CT ANGIOGRAPHY HEAD AND NECK TECHNIQUE: Multidetector CT imaging of the head and neck was performed using the standard protocol during bolus administration of intravenous contrast. Multiplanar CT image reconstructions and MIPs were obtained to evaluate the vascular anatomy. Carotid stenosis measurements (when applicable) are obtained  utilizing NASCET criteria, using the distal internal carotid diameter as the denominator. CONTRAST:  96mL OMNIPAQUE IOHEXOL 350 MG/ML SOLN COMPARISON:  Head CT without contrast 07/28/2016 and earlier. FINDINGS: CT HEAD Brain: Stable aneurysm clip related streak artifact at the left central skull base. Cerebral volume is stable and within normal limits. Scattered patchy cerebral white matter hypodensity appears stable and is most apparent in the anterior frontal lobes. No midline shift, ventriculomegaly, mass effect, evidence of mass lesion, intracranial hemorrhage or evidence of cortically based acute infarction. Calvarium and skull base: Sequelae of left frontotemporal craniotomy and medial left skull base surgical aneurysm clip appear unchanged in configuration since 2017. No acute osseous abnormality identified. Paranasal sinuses: Clear. Orbits: No acute orbit or scalp soft tissue findings. CTA NECK Skeleton: No acute osseous abnormality identified. Cervical spine disc and endplate degeneration. Upper chest: Emphysema with severe apical bullous disease and architectural distortion. Mild superimposed atelectasis. No superior mediastinal lymphadenopathy. Other neck: Thyroid goiter with multiple bilateral thyroid nodules, the largest estimated at 2 centimeters  in the medial right lobe on series 4, image 40. Otherwise negative neck soft tissues.  No cervical lymphadenopathy. Aortic arch: Minor arch atherosclerosis. Three vessel arch configuration. Right carotid system: Negative aside from mild tortuosity, and faint calcified plaque of the distal cervical right ICA just below the skull base. Left carotid system: Bulky low-density plaque at the posterior left ICA origin and bulb is noted measuring up to 8 millimeters in thickness, however, results in less than 50 % stenosis with respect to the distal vessel. Distally the cervical left ICA is normal aside from tortuosity. Vertebral arteries: No proximal right subclavian  artery stenosis despite some soft plaque. The right vertebral artery is non dominant, and has a shared origin with a superior right thoracic branch. No proximal right vertebral stenosis. The right vertebral is patent but diminutive to the skull base. No proximal left subclavian artery stenosis despite soft and calcified plaque. Normal left vertebral artery origin. The left vertebral is dominant. There is mild soft plaque in the left V1 segment without stenosis. The left vertebral artery remains patent to the skull base without stenosis. CTA HEAD Posterior circulation: The non dominant right vertebral artery remains patent to the vertebrobasilar junction. The distal left vertebral artery primarily supplies the basilar without stenosis. Normal left PICA origin. The right AICA is dominant. The vertebrobasilar junction and basilar arteries are tortuous without stenosis. The SCA and left PCA origin are normal. There is a fetal type right PCA origin. The left posterior communicating artery is diminutive or absent. The bilateral PCA branches are within normal limits. Anterior circulation: The left ICA siphon is patent with no atherosclerosis or stenosis identified. Normal left ophthalmic artery origin. There is a distal left ICA PCOM region aneurysm clip in place with no aneurysmal enhancement identified. The left ICA terminus, left MCA, and left ACA origins are otherwise within normal limits. The right ICA siphon is patent with mild tortuosity. No atherosclerosis or stenosis. The right posterior communicating artery and right ophthalmic artery origins are normal. Patent right ICA terminus. Normal right MCA and ACA origins. The anterior communicating artery is diminutive. The bilateral ACA branches are within normal limits. The right MCA M1 segment, bifurcation, and right MCA branches are within normal limits. The left MCA M1 segment has an early bifurcation. The left MCA bifurcation is ectatic. There is a 2-3 millimeter  anterior and superiorly infundibulum at the left MCA bifurcation best seen on series 8, image 146 and series 6, image 101. It appears and anterior sylvian M2 branch arises from this infundibulum. No saccular aneurysm identified, and the left MCA branches are otherwise within normal limits. Venous sinuses: Patent. Anatomic variants: Dominant left vertebral artery. Diminutive right vertebral artery which has a shared origin with the superior right thoracic arterial branch. Fetal type right PCA origin. Delayed phase: No abnormal enhancement identified. Review of the MIP images confirms the above findings IMPRESSION: 1. Positive for bulky soft plaque in the posterior Left ICA origin and bulb, but no hemodynamically significant stenosis results. Otherwise minimal arterial atherosclerosis in the head and neck. 2. Sequelae of remote distal left ICA aneurysm surgical clipping with no adverse features. 3. No other intracranial aneurysm. Mildly ectatic left MCA bifurcation with small 2-3 mm infundibulum (normal variant). 4. No acute intracranial abnormality. Stable CT appearance of the brain since 2017. 5. Moderate to severe upper lobe Emphysema (ICD10-J43.9). 6. Thyroid goiter with a 2 cm right lobe nodule which meets consensus criteria for routine follow-up Thyroid Ultrasound characterization. Electronically Signed  By: Genevie Ann M.D.   On: 01/01/2018 11:17    ____________________________________________   PROCEDURES  Procedure(s) performed: None  Procedures  Critical Care performed: No ____________________________________________   INITIAL IMPRESSION / ASSESSMENT AND PLAN / ED COURSE  Pertinent labs & imaging results that were available during my care of the patient were reviewed by me and considered in my medical decision making (see chart for details).  57 y.o. female with 3 days of postural lightheadedness and dizziness, without any headache, numbness tingling or weakness; intermittent blurred  vision.  Overall, the patient is hemodynamically stable.  Will get orthostatics and consider hypovolemia as a possible source of her symptoms.  Her laboratory studies are reassuring without any evidence of anemia, normal white blood cell count, normal electrolytes.  Her glucose is also normal at 104.  Given her history of cerebral aneurysm, will get a CT of the head with and without contrast, and evaluate for any changes in her aneurysm as well as posterior stroke; this is much less likely.  Chest x-ray has also been ordered.  Plan reevaluation for final disposition.  ----------------------------------------- 10:22 AM on 01/01/2018 -----------------------------------------  The patient's workup in the emergency department has been reassuring so far.  She is not orthostatic and continues to be IMA dynamically stable.  Her laboratory studies are reassuring with normal electrolytes, normal blood counts, normal TSH and negative troponin.  Her chest x-ray does not show acute cardiopulmonary process.  I am awaiting the results of the CT of her head and neck, and plan for reevaluation for final disposition.  ----------------------------------------- 11:50 AM on 01/01/2018 -----------------------------------------  Patient CT scan does not show any acute process.  At this time, I have reevaluated the patient who continues to be hemodynamically stable, and she states that her symptoms have completely resolved after intravenous fluids.  Plan discharge.  Follow-up instructions as well as return precautions were discussed ____________________________________________  FINAL CLINICAL IMPRESSION(S) / ED DIAGNOSES  Final diagnoses:  Dizziness  Lightheadedness         NEW MEDICATIONS STARTED DURING THIS VISIT:  New Prescriptions   No medications on file      Eula Listen, MD 01/01/18 1150

## 2018-01-01 NOTE — ED Notes (Signed)
Patient transported to CT 

## 2018-01-01 NOTE — ED Triage Notes (Signed)
Patient c/o dizziness X 3 days.

## 2018-01-01 NOTE — Discharge Instructions (Addendum)
Please drink plenty of fluids to stay well-hydrated.  Return to the emergency department if you develop severe pain, changes in vision, speech or mental status, difficulty walking, vomiting, fever, or any other symptoms concerning to you.

## 2018-02-20 ENCOUNTER — Encounter: Payer: BLUE CROSS/BLUE SHIELD | Admitting: Family

## 2018-03-11 ENCOUNTER — Encounter: Payer: BLUE CROSS/BLUE SHIELD | Admitting: Family

## 2018-04-02 ENCOUNTER — Other Ambulatory Visit: Payer: Self-pay | Admitting: Family

## 2018-04-02 DIAGNOSIS — E785 Hyperlipidemia, unspecified: Secondary | ICD-10-CM

## 2018-04-02 DIAGNOSIS — I10 Essential (primary) hypertension: Secondary | ICD-10-CM

## 2018-04-03 NOTE — Telephone Encounter (Signed)
lov 11/17/16 Last filled 08/27/16 No office visit scheduled

## 2018-04-03 NOTE — Telephone Encounter (Signed)
Call pt I have a prescription refill request for her Lipitor however the dose does not appear right.  I thought she was taking 20 mg Lipitor.  Please confirm if she is on 10 mg or 20.  I advised her to increase this last year to the 20 mg   Please refill correct dose. I refused the 10mg  lipitor  You may give her one year supply and advise f/u appt so we can check her lipids

## 2018-04-07 ENCOUNTER — Telehealth: Payer: Self-pay

## 2018-04-07 DIAGNOSIS — I10 Essential (primary) hypertension: Secondary | ICD-10-CM

## 2018-04-07 DIAGNOSIS — E785 Hyperlipidemia, unspecified: Secondary | ICD-10-CM

## 2018-04-07 MED ORDER — ATORVASTATIN CALCIUM 20 MG PO TABS: 20.0000 mg | ORAL_TABLET | Freq: Every day | ORAL | 2 refills | Status: DC

## 2018-04-07 NOTE — Telephone Encounter (Signed)
Medication refilled

## 2018-04-07 NOTE — Telephone Encounter (Signed)
Patient called back and is taking lipitor 20mg 

## 2018-04-07 NOTE — Telephone Encounter (Signed)
Copied from [place] [id number]. Topic: Quick Communication - See Telephone Encounter >> Apr 07, 2018  9:05 AM Johna Sheriff, CMA wrote: CRM for notification. See Telephone encounter for: 04/07/18. See refill request  >> Apr 07, 2018  9:21 AM Percell Belt A wrote: Pt called back and confirmed that she is taking the 20 Mg of the Lipitor and has been since it was increased    Pharmacy - [place]

## 2018-04-07 NOTE — Telephone Encounter (Signed)
Left voice mail for patient to call back ok for PEC to speak to patient    

## 2018-04-13 ENCOUNTER — Encounter: Payer: BLUE CROSS/BLUE SHIELD | Admitting: Family

## 2018-04-21 ENCOUNTER — Encounter: Payer: Self-pay | Admitting: Family

## 2018-04-29 ENCOUNTER — Telehealth: Payer: Self-pay | Admitting: *Deleted

## 2018-04-29 ENCOUNTER — Other Ambulatory Visit: Payer: Self-pay | Admitting: Family

## 2018-04-29 ENCOUNTER — Encounter: Payer: BLUE CROSS/BLUE SHIELD | Admitting: Family

## 2018-04-29 DIAGNOSIS — I10 Essential (primary) hypertension: Secondary | ICD-10-CM

## 2018-04-29 NOTE — Telephone Encounter (Signed)
Copied from [place] [phone]. Topic: Quick Communication - Appointment Cancellation >> Apr 29, 2018 12:16 PM Antonieta Iba C wrote: Patient called to cancel appointment scheduled for TODAY. Patient has rescheduled their appointment. Pt is unable to get off of work to come to apt today. Route to department's PEC pool.

## 2018-05-29 ENCOUNTER — Encounter: Payer: BLUE CROSS/BLUE SHIELD | Admitting: Family

## 2018-05-29 ENCOUNTER — Telehealth: Payer: Self-pay | Admitting: *Deleted

## 2018-05-29 NOTE — Telephone Encounter (Signed)
Copied from [place] [phone]. Topic: Quick Communication - Appointment Cancellation >> May 29, 2018  1:00 PM Synthia Innocent wrote: Patient called to cancel appointment scheduled for 05/29/18. Patient has not rescheduled their appointment.  Route to department's PEC pool.

## 2018-05-29 NOTE — Telephone Encounter (Signed)
FYI

## 2018-06-03 ENCOUNTER — Other Ambulatory Visit: Payer: Self-pay | Admitting: Family

## 2018-06-03 DIAGNOSIS — I10 Essential (primary) hypertension: Secondary | ICD-10-CM

## 2018-06-04 NOTE — Telephone Encounter (Signed)
Cancelled appt 05/29/18 Last office visit 02/12/17 Last refill 04/29/18

## 2018-06-05 NOTE — Telephone Encounter (Signed)
Call pt Refilled the hctz again  I cannot keep prescribing medications if I havent seen her in over a year  Please make a follow up appt and emphasize this. She cancelled her last.  She needs labs and BP check in office

## 2018-06-10 NOTE — Telephone Encounter (Signed)
Appointment scheduled.

## 2018-06-15 ENCOUNTER — Ambulatory Visit: Payer: BLUE CROSS/BLUE SHIELD | Admitting: Family

## 2018-07-08 ENCOUNTER — Ambulatory Visit: Payer: BLUE CROSS/BLUE SHIELD | Admitting: Family

## 2018-07-10 ENCOUNTER — Ambulatory Visit: Payer: BLUE CROSS/BLUE SHIELD | Admitting: Family

## 2018-07-20 ENCOUNTER — Ambulatory Visit (INDEPENDENT_AMBULATORY_CARE_PROVIDER_SITE_OTHER): Payer: BLUE CROSS/BLUE SHIELD | Admitting: Family

## 2018-07-20 ENCOUNTER — Encounter: Payer: Self-pay | Admitting: Family

## 2018-07-20 VITALS — BP 158/94 | HR 66 | Temp 98.3°F | Resp 15 | Ht 65.0 in | Wt 136.2 lb

## 2018-07-20 DIAGNOSIS — F17200 Nicotine dependence, unspecified, uncomplicated: Secondary | ICD-10-CM | POA: Insufficient documentation

## 2018-07-20 DIAGNOSIS — Z122 Encounter for screening for malignant neoplasm of respiratory organs: Secondary | ICD-10-CM | POA: Diagnosis not present

## 2018-07-20 DIAGNOSIS — E049 Nontoxic goiter, unspecified: Secondary | ICD-10-CM

## 2018-07-20 DIAGNOSIS — I10 Essential (primary) hypertension: Secondary | ICD-10-CM | POA: Diagnosis not present

## 2018-07-20 DIAGNOSIS — E785 Hyperlipidemia, unspecified: Secondary | ICD-10-CM

## 2018-07-20 DIAGNOSIS — M79601 Pain in right arm: Secondary | ICD-10-CM | POA: Diagnosis not present

## 2018-07-20 DIAGNOSIS — M25511 Pain in right shoulder: Secondary | ICD-10-CM

## 2018-07-20 MED ORDER — DICLOFENAC SODIUM 1 % TD GEL: 4.0000 g | Freq: Four times a day (QID) | TRANSDERMAL | 3 refills | Status: DC

## 2018-07-20 MED ORDER — GABAPENTIN 100 MG PO CAPS: 100.0000 mg | ORAL_CAPSULE | Freq: Three times a day (TID) | ORAL | 0 refills | Status: DC | PRN

## 2018-07-20 MED ORDER — LIDOCAINE 5 % EX PTCH: 1.0000 | MEDICATED_PATCH | CUTANEOUS | 0 refills | Status: DC

## 2018-07-20 MED ORDER — LOSARTAN POTASSIUM 50 MG PO TABS: 50.0000 mg | ORAL_TABLET | Freq: Every day | ORAL | 3 refills | Status: DC

## 2018-07-20 NOTE — Patient Instructions (Addendum)
We discussed chantix and wellbutrin for smoking cessation. Lets discuss further at your physical.   Trial of gabapentin for right arm pain  Be careful with drowsiness on this medication - particularly if you take this during day time at work. May take two tablets at bedtime if pain warrants. We will discuss orthopedics at follow up.   Take metoprolol twice daily as prescribed  Stop lisinopril  Start losartan  Monitor blood pressure,  Goal is less than 120/80, based on newest guidelines; if persistently higher, please make sooner follow up appointment so we can recheck you blood pressure and manage medications  Labs in approx 5 days since starting losartan.

## 2018-07-20 NOTE — Progress Notes (Signed)
Subjective:    Patient ID: Brooke Murphy, female    DOB: 1961/06/21, 57 y.o.   MRN: [id number]  CC: Brooke Murphy is a 57 y.o. female who presents today for follow up.   HPI: HA- resolved. Dizziness resolved.    H/o aneurysm  HTN- stopped lisinopril as making hair falling out, which has improved since stopping; taking metoprolol TID, hctz  Denies exertional chest pain or pressure, numbness or tingling radiating to left arm or jaw, palpitations, dizziness, frequent headaches, changes in vision, or shortness of breath.   HLD - compliant with lipitor  Right arm pain,, waxes and wanes. Worse on days when busy at work depending on orders.  thinks from repetitive motions at work. Flexeril helps somewhat.Cannot get arm comfortable at night. Two tylenol arthritis resolves. Right arm from shoulder and all the way to the fingers ( all) become numb. No rash, pain with meals.   Cannot do MRI due to aneurysm clips.   Had been on gabapentin in the past.   Due mammogram and colonoscopy; will return for cpe.   Smoker, 7 cigarettes.  Smokes when bored. Smoked for less than 30 years.   H/o hypothyroidism. No h/o nodules, biopsy.   CT angiography head and neck 12/2017. Stable aneursyem clip left skull base. Emphysema with bullous disease. Goiter. Atherosclerosis.  Plaque left ICA.   HISTORY:  Past Medical History:  Diagnosis Date  . Brain aneurysm 1999   [facility]  . Hyperlipidemia   . Hypertension   . Rheumatoid arthritis ([place]) 2002   s/p methotrexate, humira and embrel  . Ulcer of abdomen wall [facility])    Past Surgical History:  Procedure Laterality Date  . ABDOMINAL HYSTERECTOMY  2005   ovaries remain; WITH CERVIX  . BRAIN SURGERY Left 11/27/1997   aneurysm   Family History  Problem Relation Age of Onset  . Hypertension Mother   . Aneurysm Mother 60       died  . Hyperlipidemia Sister   . Hypertension Sister   . Cancer Father 70       throat cancer  . Diabetes Maternal Aunt   . Breast  cancer Maternal Aunt 65  . Hypertension Maternal Grandmother   . Stroke Maternal Grandmother   . Colon cancer Neg Hx     Allergies: Penicillins; Celebrex [celecoxib]; and Lodine [etodolac] Current Outpatient Medications on File Prior to Visit  Medication Sig Dispense Refill  . atorvastatin (LIPITOR) 20 MG tablet Take 1 tablet (20 mg total) by mouth daily. 90 tablet 2  . bimatoprost (LUMIGAN) 0.01 % SOLN Place 1 drop into both eyes at bedtime.    . cyclobenzaprine (FLEXERIL) 10 MG tablet Take 1 tablet (10 mg total) by mouth 3 (three) times daily as needed for muscle spasms. 90 tablet 0  . fluticasone (FLONASE) 50 MCG/ACT nasal spray Place 2 sprays into both nostrils daily. (Patient taking differently: Place 2 sprays into both nostrils daily as needed for allergies. ) 16 g 6  . hydrochlorothiazide (MICROZIDE) 12.5 MG capsule TAKE 1 CAPSULE BY MOUTH EVERY DAY 30 capsule 0  . metoprolol tartrate (LOPRESSOR) 25 MG tablet TAKE 1 TABLET (25 MG TOTAL) BY MOUTH 2 (TWO) TIMES DAILY. 180 tablet 0  . mirtazapine (REMERON) 30 MG tablet Take 1 tablet (30 mg total) by mouth at bedtime. 90 tablet 1  . montelukast (SINGULAIR) 10 MG tablet TAKE 1 TABLET BY MOUTH EVERYDAY AT BEDTIME 30 tablet 3  . omeprazole (PRILOSEC) 20 MG capsule Take by mouth daily.  3   No current facility-administered medications on file prior to visit.     Social History   Tobacco Use  . Smoking status: Former Smoker    Last attempt to quit: 01/23/2015    Years since quitting: 3.4  . Smokeless tobacco: Never Used  Substance Use Topics  . Alcohol use: No    Alcohol/week: 0.0 standard drinks  . Drug use: No    Review of Systems  Constitutional: Negative for chills and fever.  HENT: Negative for trouble swallowing.   Respiratory: Negative for cough.   Cardiovascular: Negative for chest pain and palpitations.  Gastrointestinal: Negative for nausea and vomiting.  Neurological: Positive for numbness (right arm). Negative for  dizziness and headaches.      Objective:    There were no vitals taken for this visit. BP Readings from Last 3 Encounters:  01/01/18 (!) 129/94  11/17/17 120/88  04/28/17 136/66   Wt Readings from Last 3 Encounters:  01/01/18 137 lb (62.1 kg)  11/17/17 139 lb 2 oz (63.1 kg)  04/28/17 139 lb 12.8 oz (63.4 kg)    Physical Exam  Constitutional: She appears well-developed and well-nourished.  Eyes: Conjunctivae are normal.  Neck: Thyromegaly present. No thyroid mass present.  Cardiovascular: Normal rate, regular rhythm, normal heart sounds and normal pulses.  Pulmonary/Chest: Effort normal and breath sounds normal. She has no wheezes. She has no rhonchi. She has no rales.  Musculoskeletal:       Right shoulder: She exhibits pain. She exhibits normal range of motion, no tenderness and no spasm.       Cervical back: She exhibits normal range of motion, no tenderness, no bony tenderness, no pain and no spasm.       Arms: Right Shoulder:   No asymmetry of shoulders when comparing right and left.No pain with palpation over glenohumeral joint lines, Reserve joint, AC joint, or bicipital groove. Pain over posterior scapular region. No pain with internal and external rotation. No pain with resisted lateral extension .   Negative active painful arc sign. Negative passive arc ( Neer's). Negative drop arm.   Neurological: She is alert.  Strength and sensation intact BUE.   Skin: Skin is warm and dry.  Psychiatric: She has a normal mood and affect. Her speech is normal and behavior is normal. Thought content normal.  Vitals reviewed.      Assessment & Plan:   Problem List Items Addressed This Visit      Cardiovascular and Mediastinum   HTN (hypertension) - Primary    Elevated. Stop lisinopril. Start losartan. Labs in 5 days. Advised to take metoprolol BID ( versus TID). She will monitor BP at home.       Relevant Medications   losartan (COZAAR) 50 MG tablet   Other Relevant Orders    Basic metabolic panel     Endocrine   Goiter    Pending thyroid US.       Relevant Orders   US THYROID     Other   Right shoulder pain    Suspect nerve impingement based on symptoms.Suspect work contributory.  Trial of gabapentin, topical therapies. Unable to have MRI. If no improvement, patient will let me know and we will pursue orthopedics consult.       HLD (hyperlipidemia)    Compliant ; will continue regimen.       Relevant Medications   losartan (COZAAR) 50 MG tablet   Smoker    Will consider chantix, wellbutrin at f/u appt.  Relevant Orders   CT CHEST LUNG CANCER SCREENING LOW DOSE WO CONTRAST   Right arm pain   Relevant Medications   gabapentin (NEURONTIN) 100 MG capsule   lidocaine (LIDODERM) 5 %   diclofenac sodium (VOLTAREN) 1 % GEL   Encounter for screening for malignant neoplasm of respiratory organs   Relevant Orders   CT CHEST LUNG CANCER SCREENING LOW DOSE WO CONTRAST       I have discontinued Brooke Murphy's aspirin EC, lisinopril, and doxycycline. I have also changed her gabapentin. Additionally, I am having her start on lidocaine, diclofenac sodium, and losartan. Lastly, I am having her maintain her bimatoprost, fluticasone, cyclobenzaprine, mirtazapine, montelukast, omeprazole, metoprolol tartrate, atorvastatin, and hydrochlorothiazide.   Meds ordered this encounter  Medications  . gabapentin (NEURONTIN) 100 MG capsule    Sig: Take 1 capsule (100 mg total) by mouth 3 (three) times daily as needed (for headache/nerve pain).    Dispense:  90 capsule    Refill:  0    Order Specific Question:   Supervising Provider    Answer:   Deborra Medina L [[id number]]  . lidocaine (LIDODERM) 5 %    Sig: Place 1 patch onto the skin daily. Remove & Discard patch within 12 hours.    Dispense:  30 patch    Refill:  0    Order Specific Question:   Supervising Provider    Answer:   Deborra Medina L [[id number]]  . diclofenac sodium (VOLTAREN) 1 % GEL    Sig: Apply 4 g  topically 4 (four) times daily.    Dispense:  1 Tube    Refill:  3    Order Specific Question:   Supervising Provider    Answer:   Derrel Nip, TERESA L [[id number]]  . losartan (COZAAR) 50 MG tablet    Sig: Take 1 tablet (50 mg total) by mouth daily.    Dispense:  90 tablet    Refill:  3    Order Specific Question:   Supervising Provider    Answer:   Crecencio Mc [[id number]]    Return precautions given.   Risks, benefits, and alternatives of the medications and treatment plan prescribed today were discussed, and patient expressed understanding.   Education regarding symptom management and diagnosis given to patient on AVS.  Continue to follow with Burnard Hawthorne, FNP for routine health maintenance.   Talmadge Coventry and I agreed with plan.   [name], FNP

## 2018-07-20 NOTE — Assessment & Plan Note (Signed)
Pending thyroid US.

## 2018-07-20 NOTE — Assessment & Plan Note (Signed)
Elevated. Stop lisinopril. Start losartan. Labs in 5 days. Advised to take metoprolol BID ( versus TID). She will monitor BP at home.

## 2018-07-20 NOTE — Assessment & Plan Note (Signed)
Compliant ; will continue regimen.

## 2018-07-20 NOTE — Assessment & Plan Note (Signed)
Suspect nerve impingement based on symptoms.Suspect work contributory.  Trial of gabapentin, topical therapies. Unable to have MRI. If no improvement, patient will let me know and we will pursue orthopedics consult.

## 2018-07-20 NOTE — Assessment & Plan Note (Signed)
Will consider chantix, wellbutrin at f/u appt.

## 2018-07-21 ENCOUNTER — Telehealth: Payer: Self-pay | Admitting: Family

## 2018-07-21 NOTE — Telephone Encounter (Signed)
Medication:losartan (COZAAR) 50 MG tablet   Pt called stating that she was advised by pharmacy to contact office as this medication is on back order. Pt would like to know if a new RX for 25 mg can be sent in its place. Please advise.   [id number] - [place], [place] - [address]

## 2018-07-21 NOTE — Telephone Encounter (Signed)
Copied from [place] [id number]. Topic: Quick Communication - Rx Refill/Question >> Jul 21, 2018  5:11 PM Sheran Luz wrote: Medication:losartan (COZAAR) 50 MG tablet   Pt called stating that she was advised by pharmacy to contact office as this medication is on back order. Pt would like to know if a new RX for 25 mg can be sent in its place. Please advise.   Preferred Pharmacy (with phone number or street name):[id number] [place], [place] - [address]

## 2018-07-22 MED ORDER — LOSARTAN POTASSIUM 25 MG PO TABS: 50.0000 mg | ORAL_TABLET | Freq: Every day | ORAL | 1 refills | Status: DC

## 2018-07-22 NOTE — Telephone Encounter (Signed)
Yes That is okay I sent losartan 25mg  to her pharmacy - she should take 2 tablets.

## 2018-07-23 NOTE — Telephone Encounter (Signed)
Patient advised that losartan 25mg  sent to pharmacy and she should take 2 tablets

## 2018-07-24 ENCOUNTER — Telehealth: Payer: Self-pay | Admitting: *Deleted

## 2018-07-24 ENCOUNTER — Other Ambulatory Visit: Payer: Self-pay | Admitting: Family

## 2018-07-24 DIAGNOSIS — Z122 Encounter for screening for malignant neoplasm of respiratory organs: Secondary | ICD-10-CM

## 2018-07-24 DIAGNOSIS — Z87891 Personal history of nicotine dependence: Secondary | ICD-10-CM

## 2018-07-24 DIAGNOSIS — J302 Other seasonal allergic rhinitis: Secondary | ICD-10-CM

## 2018-07-24 NOTE — Telephone Encounter (Signed)
Received referral for initial lung cancer screening scan. Contacted patient and obtained smoking history,(current, 33.3 pack year) as well as answering questions related to screening process. Patient denies signs of lung cancer such as weight loss or hemoptysis. Patient denies comorbidity that would prevent curative treatment if lung cancer were found. Patient is scheduled for shared decision making visit and CT scan on 08/17/18 at 1130am.

## 2018-07-27 ENCOUNTER — Other Ambulatory Visit: Payer: BLUE CROSS/BLUE SHIELD

## 2018-07-28 ENCOUNTER — Other Ambulatory Visit: Payer: Self-pay | Admitting: Family

## 2018-07-28 DIAGNOSIS — Z1231 Encounter for screening mammogram for malignant neoplasm of breast: Secondary | ICD-10-CM

## 2018-07-29 ENCOUNTER — Encounter: Payer: BLUE CROSS/BLUE SHIELD | Admitting: Family

## 2018-08-03 ENCOUNTER — Other Ambulatory Visit: Payer: Self-pay | Admitting: Family

## 2018-08-03 ENCOUNTER — Ambulatory Visit
Admission: RE | Admit: 2018-08-03 | Discharge: 2018-08-03 | Disposition: A | Discharge status: Home / Self Care | Payer: BLUE CROSS/BLUE SHIELD | Source: Ambulatory Visit | Attending: Family | Admitting: Family

## 2018-08-03 DIAGNOSIS — E049 Nontoxic goiter, unspecified: Secondary | ICD-10-CM

## 2018-08-03 DIAGNOSIS — E041 Nontoxic single thyroid nodule: Secondary | ICD-10-CM | POA: Diagnosis not present

## 2018-08-07 ENCOUNTER — Other Ambulatory Visit: Payer: Self-pay | Admitting: Family

## 2018-08-07 DIAGNOSIS — I10 Essential (primary) hypertension: Secondary | ICD-10-CM

## 2018-08-07 MED ORDER — METOPROLOL TARTRATE 25 MG PO TABS: ORAL_TABLET | ORAL | 0 refills | Status: DC

## 2018-08-07 MED ORDER — HYDROCHLOROTHIAZIDE 12.5 MG PO CAPS: ORAL_CAPSULE | ORAL | 3 refills | Status: DC

## 2018-08-07 MED ORDER — OMEPRAZOLE 20 MG PO CPDR: 20.0000 mg | DELAYED_RELEASE_CAPSULE | Freq: Every day | ORAL | 3 refills | Status: DC

## 2018-08-07 NOTE — Telephone Encounter (Signed)
Call placed to patient. Per visit 07/20/18 patient needs appointment for CPE.

## 2018-08-07 NOTE — Telephone Encounter (Signed)
Requested medication (s) are due for refill today: yes  Requested medication (s) are on the active medication list: yes  Last refill:   Prilosec( historical provider)  HCTZ 05/26/18  #30  0 refills,  Metoprolol 12/17/17  #180  0refills  Future visit scheduled: no office note states pt needs appointment for CPE  Notes to clinic:  Call placed to pt. VM to call for appointment.    Requested Prescriptions  Pending Prescriptions Disp Refills   omeprazole (PRILOSEC) 20 MG capsule  3    Sig: Take by mouth daily.     Gastroenterology: Proton Pump Inhibitors Passed - 08/07/2018 12:24 PM      Passed - Valid encounter within last 12 months    Recent Outpatient Visits          2 weeks ago Essential hypertension   [place], Yvetta Coder, FNP   8 months ago Sinus pressure   [place], Yvetta Coder, FNP   1 year ago Essential hypertension   [place], Yvetta Coder, [place]   1 year ago Routine physical examination   [facility] [place], Yvetta Coder, [place]   1 year ago Essential hypertension   [facility] [place], Yvetta Coder, [place]            hydrochlorothiazide (MICROZIDE) 12.5 MG capsule      Sig: Take by mouth daily.     Cardiovascular: Diuretics - Thiazide Failed - 08/07/2018 12:24 PM      Failed - Last BP in normal range    BP Readings from Last 1 Encounters:  07/20/18 (!) 158/94         Passed - Ca in normal range and within 360 days    Calcium  Date Value Ref Range Status  01/01/2018 9.1 8.9 - 10.3 mg/dL Final   Calcium, Total  Date Value Ref Range Status  10/11/2014 9.6 8.5 - 10.1 mg/dL Final         Passed - Cr in normal range and within 360 days    Creatinine  Date Value Ref Range Status  10/11/2014 0.84 0.60 - 1.30 mg/dL Final   Creatinine, Ser  Date Value Ref Range Status  01/01/2018 0.81 0.44 - 1.00 mg/dL Final         Passed - K in normal range  and within 360 days    Potassium  Date Value Ref Range Status  01/01/2018 3.6 3.5 - 5.1 mmol/L Final  10/11/2014 3.9 3.5 - 5.1 mmol/L Final         Passed - Na in normal range and within 360 days    Sodium  Date Value Ref Range Status  01/01/2018 136 135 - 145 mmol/L Final  10/11/2014 135 (L) 136 - 145 mmol/L Final         Passed - Valid encounter within last 6 months    Recent Outpatient Visits          2 weeks ago Essential hypertension   [place], Yvetta Coder, FNP   8 months ago Sinus pressure   [place], Yvetta Coder, FNP   1 year ago Essential hypertension   [place], Yvetta Coder, [place]   1 year ago Routine physical examination   [facility] [place], Yvetta Coder, [place]   1 year ago Essential hypertension   [facility] [place], Yvetta Coder, [place]  metoprolol tartrate (LOPRESSOR) 25 MG tablet 180 tablet 0    Sig: TAKE 1 TABLET (25 MG TOTAL) BY MOUTH 2 (TWO) TIMES DAILY.     Cardiovascular:  Beta Blockers Failed - 08/07/2018 12:24 PM      Failed - Last BP in normal range    BP Readings from Last 1 Encounters:  07/20/18 (!) 158/94         Passed - Last Heart Rate in normal range    Pulse Readings from Last 1 Encounters:  07/20/18 66         Passed - Valid encounter within last 6 months    Recent Outpatient Visits          2 weeks ago Essential hypertension   [place], Yvetta Coder, FNP   8 months ago Sinus pressure   [place], FNP   1 year ago Essential hypertension   [place], Yvetta Coder, FNP   1 year ago Routine physical examination   [facility] [place], Yvetta Coder, FNP   1 year ago Essential hypertension   [place], Yvetta Coder, [place]

## 2018-08-07 NOTE — Telephone Encounter (Signed)
Copied from [place] [phone] [phone]. Topic: Quick Communication - See Telephone Encounter >> Aug 07, 2018  8:37 AM Conception Chancy, NT wrote: CRM for notification. See Telephone encounter for: 08/07/18.  Patient is calling and requesting a refill on omeprazole (PRILOSEC) 20 MG capsule, hydrochlorothiazide (MICROZIDE) 12.5 MG capsule, and metoprolol tartrate (LOPRESSOR) 25 MG tablet.  [id number] [place], [place] - [address] [place] [address] Phone: [phone] Fax: [phone]

## 2018-08-12 ENCOUNTER — Other Ambulatory Visit: Payer: Self-pay | Admitting: Family

## 2018-08-12 DIAGNOSIS — M79601 Pain in right arm: Secondary | ICD-10-CM

## 2018-08-17 ENCOUNTER — Ambulatory Visit
Admission: RE | Admit: 2018-08-17 | Discharge: 2018-08-17 | Disposition: A | Discharge status: Home / Self Care | Payer: BLUE CROSS/BLUE SHIELD | Source: Ambulatory Visit | Attending: Nurse Practitioner | Admitting: Nurse Practitioner

## 2018-08-17 ENCOUNTER — Encounter: Payer: Self-pay | Admitting: Nurse Practitioner

## 2018-08-17 ENCOUNTER — Inpatient Hospital Stay: Payer: BLUE CROSS/BLUE SHIELD | Attending: Nurse Practitioner | Admitting: Nurse Practitioner

## 2018-08-17 DIAGNOSIS — Z122 Encounter for screening for malignant neoplasm of respiratory organs: Secondary | ICD-10-CM | POA: Diagnosis not present

## 2018-08-17 DIAGNOSIS — Z87891 Personal history of nicotine dependence: Secondary | ICD-10-CM | POA: Diagnosis not present

## 2018-08-17 NOTE — Progress Notes (Signed)
In accordance with CMS guidelines, patient has met eligibility criteria including age, absence of signs or symptoms of lung cancer.  Social History   Tobacco Use  . Smoking status: Current Every Day Smoker    Packs/day: 0.90    Years: 37.00    Pack years: 33.30  . Smokeless tobacco: Never Used  Substance Use Topics  . Alcohol use: No    Alcohol/week: 0.0 standard drinks  . Drug use: No      A shared decision-making session was conducted prior to the performance of CT scan. This includes one or more decision aids, includes benefits and harms of screening, follow-up diagnostic testing, over-diagnosis, false positive rate, and total radiation exposure.   Counseling on the importance of adherence to annual lung cancer LDCT screening, impact of co-morbidities, and ability or willingness to undergo diagnosis and treatment is imperative for compliance of the program.   Counseling on the importance of continued smoking cessation for former smokers; the importance of smoking cessation for current smokers, and information about tobacco cessation interventions have been given to patient including [facility] and [facility] quit Home programs.   Written order for lung cancer screening with LDCT has been given to the patient and any and all questions have been answered to the best of my abilities.    Yearly follow up will be coordinated by Burgess Estelle, Thoracic Navigator.  Beckey Rutter, DNP, AGNP-C [place] at [facility] [phone] (work cell) [phone] (office) 08/17/18 11:34 AM

## 2018-08-19 ENCOUNTER — Telehealth: Payer: Self-pay | Admitting: *Deleted

## 2018-08-19 NOTE — Telephone Encounter (Signed)
Notified patient of LDCT lung cancer screening program results with recommendation for 6 month follow up imaging. Also notified of incidental findings noted below and is encouraged to discuss further with PCP who will receive a copy of this note and/or the CT report. Patient verbalizes understanding.   IMPRESSION: 1. 7.8 mm right upper lobe nodule. Lung-RADS 3, probably benign findings. Short-term follow-up in 6 months is recommended with repeat low-dose chest CT without contrast (please use the following order, "CT CHEST LCS NODULE FOLLOW-UP W/O CM"). 2. Aortic atherosclerosis (ICD10-170.0). Coronary artery calcification. 3.  Emphysema (ICD10-J43.9).

## 2018-08-26 ENCOUNTER — Ambulatory Visit
Admission: RE | Admit: 2018-08-26 | Discharge: 2018-08-26 | Disposition: A | Discharge status: Home / Self Care | Payer: BLUE CROSS/BLUE SHIELD | Source: Ambulatory Visit | Attending: Family | Admitting: Family

## 2018-08-26 DIAGNOSIS — Z1231 Encounter for screening mammogram for malignant neoplasm of breast: Secondary | ICD-10-CM

## 2018-09-09 ENCOUNTER — Telehealth: Payer: Self-pay | Admitting: Family

## 2018-09-09 DIAGNOSIS — I700 Atherosclerosis of aorta: Secondary | ICD-10-CM | POA: Insufficient documentation

## 2018-09-09 NOTE — Telephone Encounter (Signed)
Letter has been printed & sent.

## 2018-09-09 NOTE — Telephone Encounter (Signed)
Please mail letter to patient:   Brooke Murphy, [place] you are doing well.   I received results from your annual CT lung scan from [facility].  It was noted on the exam that you have coronary artery atherosclerosis, or often referred to as hardening of the arteries.   This can put you at risk for stroke, heart attack.   I would also like to ensure you have had a recent lipid panel ( fasting blood work) to ensure your cholesterol is at goal since you are on the Lipitor.  Furthermore, I want you to be aware that they are advising you repeat the CT chest in 6 months based on lung nodule seen in right upper lobe.Marland Kitchen  Please ensure that you are contacted in regards to this being scheduled.  If any concerns, questions do not hesitate to call our office.  Look forward to seeing you!   Best,   [name], NP

## 2018-09-11 ENCOUNTER — Other Ambulatory Visit (HOSPITAL_COMMUNITY)
Admission: RE | Admit: 2018-09-11 | Discharge: 2018-09-11 | Disposition: A | Discharge status: Home / Self Care | Payer: BLUE CROSS/BLUE SHIELD | Source: Ambulatory Visit | Attending: Family | Admitting: Family

## 2018-09-11 ENCOUNTER — Ambulatory Visit (INDEPENDENT_AMBULATORY_CARE_PROVIDER_SITE_OTHER): Payer: BLUE CROSS/BLUE SHIELD | Admitting: Family

## 2018-09-11 ENCOUNTER — Encounter: Payer: Self-pay | Admitting: Family

## 2018-09-11 ENCOUNTER — Other Ambulatory Visit: Payer: Self-pay

## 2018-09-11 VITALS — BP 140/82 | HR 80 | Temp 97.9°F | Ht 65.25 in | Wt 146.4 lb

## 2018-09-11 DIAGNOSIS — Z0000 Encounter for general adult medical examination without abnormal findings: Secondary | ICD-10-CM

## 2018-09-11 DIAGNOSIS — F32A Depression, unspecified: Secondary | ICD-10-CM

## 2018-09-11 DIAGNOSIS — F329 Major depressive disorder, single episode, unspecified: Secondary | ICD-10-CM | POA: Diagnosis not present

## 2018-09-11 DIAGNOSIS — I10 Essential (primary) hypertension: Secondary | ICD-10-CM | POA: Diagnosis not present

## 2018-09-11 DIAGNOSIS — J302 Other seasonal allergic rhinitis: Secondary | ICD-10-CM

## 2018-09-11 DIAGNOSIS — E785 Hyperlipidemia, unspecified: Secondary | ICD-10-CM | POA: Diagnosis not present

## 2018-09-11 DIAGNOSIS — Z124 Encounter for screening for malignant neoplasm of cervix: Secondary | ICD-10-CM | POA: Insufficient documentation

## 2018-09-11 MED ORDER — MONTELUKAST SODIUM 10 MG PO TABS: ORAL_TABLET | ORAL | 3 refills | Status: DC

## 2018-09-11 MED ORDER — ATORVASTATIN CALCIUM 20 MG PO TABS: 20.0000 mg | ORAL_TABLET | Freq: Every day | ORAL | 2 refills | Status: DC

## 2018-09-11 MED ORDER — LOSARTAN POTASSIUM 100 MG PO TABS: 100.0000 mg | ORAL_TABLET | Freq: Every day | ORAL | 3 refills | Status: DC

## 2018-09-11 MED ORDER — LOSARTAN POTASSIUM 25 MG PO TABS: 50.0000 mg | ORAL_TABLET | Freq: Every day | ORAL | 1 refills | Status: DC

## 2018-09-11 MED ORDER — MIRTAZAPINE 30 MG PO TABS: 30.0000 mg | ORAL_TABLET | Freq: Every day | ORAL | 1 refills | Status: DC

## 2018-09-11 NOTE — Assessment & Plan Note (Addendum)
Elevated.  Will increase losartan.  Recheck CMP in 1 week.  Patient will monitor blood pressure at home.

## 2018-09-11 NOTE — Assessment & Plan Note (Signed)
Clinical breast exam performed today, Pap smear performed.  Advised patient that if Pap is abnormal, she will need to see GYN.  Colonoscopy ordered.

## 2018-09-11 NOTE — Patient Instructions (Addendum)
Fasting labs next Friday- please schedule  Please increase her losartan to 100 mg once a day from the current dose of 25 mg twice a day.  Monitor blood pressure,  Goal is less than 120/80, based on newest guidelines; if persistently higher, please make sooner follow up appointment so we can recheck you blood pressure and manage medications    Health Maintenance for Postmenopausal Women Menopause is a normal process in which your reproductive ability comes to an end. This process happens gradually over a span of months to years, usually between the ages of 33 and 42. Menopause is complete when you have missed 12 consecutive menstrual periods. It is important to talk with your health care provider about some of the most common conditions that affect postmenopausal women, such as heart disease, cancer, and bone loss (osteoporosis). Adopting a healthy lifestyle and getting preventive care can help to promote your health and wellness. Those actions can also lower your chances of developing some of these common conditions. What should I know about menopause? During menopause, you may experience a number of symptoms, such as:  Moderate-to-severe hot flashes.  Night sweats.  Decrease in sex drive.  Mood swings.  Headaches.  Tiredness.  Irritability.  Memory problems.  Insomnia. Choosing to treat or not to treat menopausal changes is an individual decision that you make with your health care provider. What should I know about hormone replacement therapy and supplements? Hormone therapy products are effective for treating symptoms that are associated with menopause, such as hot flashes and night sweats. Hormone replacement carries certain risks, especially as you become older. If you are thinking about using estrogen or estrogen with progestin treatments, discuss the benefits and risks with your health care provider. What should I know about heart disease and stroke? Heart disease, heart  attack, and stroke become more likely as you age. This may be due, in part, to the hormonal changes that your body experiences during menopause. These can affect how your body processes dietary fats, triglycerides, and cholesterol. Heart attack and stroke are both medical emergencies. There are many things that you can do to help prevent heart disease and stroke:  Have your blood pressure checked at least every 1-2 years. High blood pressure causes heart disease and increases the risk of stroke.  If you are 43-41 years old, ask your health care provider if you should take aspirin to prevent a heart attack or a stroke.  Do not use any tobacco products, including cigarettes, chewing tobacco, or electronic cigarettes. If you need help quitting, ask your health care provider.  It is important to eat a healthy diet and maintain a healthy weight. ? Be sure to include plenty of vegetables, fruits, low-fat dairy products, and lean protein. ? Avoid eating foods that are high in solid fats, added sugars, or salt (sodium).  Get regular exercise. This is one of the most important things that you can do for your health. ? Try to exercise for at least 150 minutes each week. The type of exercise that you do should increase your heart rate and make you sweat. This is known as moderate-intensity exercise. ? Try to do strengthening exercises at least twice each week. Do these in addition to the moderate-intensity exercise.  Know your numbers.Ask your health care provider to check your cholesterol and your blood glucose. Continue to have your blood tested as directed by your health care provider.  What should I know about cancer screening? There are several types of  cancer. Take the following steps to reduce your risk and to catch any cancer development as early as possible. Breast Cancer  Practice breast self-awareness. ? This means understanding how your breasts normally appear and feel. ? It also means  doing regular breast self-exams. Let your health care provider know about any changes, no matter how small.  If you are 33 or older, have a clinician do a breast exam (clinical breast exam or CBE) every year. Depending on your age, family history, and medical history, it may be recommended that you also have a yearly breast X-ray (mammogram).  If you have a family history of breast cancer, talk with your health care provider about genetic screening.  If you are at high risk for breast cancer, talk with your health care provider about having an MRI and a mammogram every year.  Breast cancer (BRCA) gene test is recommended for women who have family members with BRCA-related cancers. Results of the assessment will determine the need for genetic counseling and BRCA1 and for BRCA2 testing. BRCA-related cancers include these types: ? Breast. This occurs in males or females. ? Ovarian. ? Tubal. This may also be called fallopian tube cancer. ? Cancer of the abdominal or pelvic lining (peritoneal cancer). ? Prostate. ? Pancreatic. Cervical, Uterine, and Ovarian Cancer Your health care provider may recommend that you be screened regularly for cancer of the pelvic organs. These include your ovaries, uterus, and vagina. This screening involves a pelvic exam, which includes checking for microscopic changes to the surface of your cervix (Pap test).  For women ages 21-65, health care providers may recommend a pelvic exam and a Pap test every three years. For women ages 69-65, they may recommend the Pap test and pelvic exam, combined with testing for human papilloma virus (HPV), every five years. Some types of HPV increase your risk of cervical cancer. Testing for HPV may also be done on women of any age who have unclear Pap test results.  Other health care providers may not recommend any screening for nonpregnant women who are considered low risk for pelvic cancer and have no symptoms. Ask your health care  provider if a screening pelvic exam is right for you.  If you have had past treatment for cervical cancer or a condition that could lead to cancer, you need Pap tests and screening for cancer for at least 20 years after your treatment. If Pap tests have been discontinued for you, your risk factors (such as having a new sexual partner) need to be reassessed to determine if you should start having screenings again. Some women have medical problems that increase the chance of getting cervical cancer. In these cases, your health care provider may recommend that you have screening and Pap tests more often.  If you have a family history of uterine cancer or ovarian cancer, talk with your health care provider about genetic screening.  If you have vaginal bleeding after reaching menopause, tell your health care provider.  There are currently no reliable tests available to screen for ovarian cancer. Lung Cancer Lung cancer screening is recommended for adults 46-20 years old who are at high risk for lung cancer because of a history of smoking. A yearly low-dose CT scan of the lungs is recommended if you:  Currently smoke.  Have a history of at least 30 pack-years of smoking and you currently smoke or have quit within the past 15 years. A pack-year is smoking an average of one pack of cigarettes per  day for one year. Yearly screening should:  Continue until it has been 15 years since you quit.  Stop if you develop a health problem that would prevent you from having lung cancer treatment. Colorectal Cancer  This type of cancer can be detected and can often be prevented.  Routine colorectal cancer screening usually begins at age 3 and continues through age 29.  If you have risk factors for colon cancer, your health care provider may recommend that you be screened at an earlier age.  If you have a family history of colorectal cancer, talk with your health care provider about genetic screening.  Your  health care provider may also recommend using home test kits to check for hidden blood in your stool.  A small camera at the end of a tube can be used to examine your colon directly (sigmoidoscopy or colonoscopy). This is done to check for the earliest forms of colorectal cancer.  Direct examination of the colon should be repeated every 5-10 years until age 37. However, if early forms of precancerous polyps or small growths are found or if you have a family history or genetic risk for colorectal cancer, you may need to be screened more often. Skin Cancer  Check your skin from head to toe regularly.  Monitor any moles. Be sure to tell your health care provider: ? About any new moles or changes in moles, especially if there is a change in a mole's shape or color. ? If you have a mole that is larger than the size of a pencil eraser.  If any of your family members has a history of skin cancer, especially at a young age, talk with your health care provider about genetic screening.  Always use sunscreen. Apply sunscreen liberally and repeatedly throughout the day.  Whenever you are outside, protect yourself by wearing long sleeves, pants, a wide-brimmed hat, and sunglasses. What should I know about osteoporosis? Osteoporosis is a condition in which bone destruction happens more quickly than new bone creation. After menopause, you may be at an increased risk for osteoporosis. To help prevent osteoporosis or the bone fractures that can happen because of osteoporosis, the following is recommended:  If you are 53-46 years old, get at least 1,000 mg of calcium and at least 600 mg of vitamin D per day.  If you are older than age 54 but younger than age 71, get at least 1,200 mg of calcium and at least 600 mg of vitamin D per day.  If you are older than age 65, get at least 1,200 mg of calcium and at least 800 mg of vitamin D per day. Smoking and excessive alcohol intake increase the risk of  osteoporosis. Eat foods that are rich in calcium and vitamin D, and do weight-bearing exercises several times each week as directed by your health care provider. What should I know about how menopause affects my mental health? Depression may occur at any age, but it is more common as you become older. Common symptoms of depression include:  Low or sad mood.  Changes in sleep patterns.  Changes in appetite or eating patterns.  Feeling an overall lack of motivation or enjoyment of activities that you previously enjoyed.  Frequent crying spells. Talk with your health care provider if you think that you are experiencing depression. What should I know about immunizations? It is important that you get and maintain your immunizations. These include:  Tetanus, diphtheria, and pertussis (Tdap) booster vaccine.  Influenza every  year before the flu season begins.  Pneumonia vaccine.  Shingles vaccine. Your health care provider may also recommend other immunizations. This information is not intended to replace advice given to you by your health care provider. Make sure you discuss any questions you have with your health care provider. Document Released: 11/01/2005 Document Revised: 03/29/2016 Document Reviewed: 06/13/2015 Elsevier Interactive Patient Education  2019 [employer].

## 2018-09-11 NOTE — Progress Notes (Signed)
Subjective:    Patient ID: Brooke Murphy, female    DOB: July 31, 1961, 57 y.o.   MRN: [id number]  CC: Brooke Murphy is a 57 y.o. female who presents today for physical exam.    HPI: Feels well today, no complaints.    HTN-on  losartan now. At home, 136/82. Denies exertional chest pain or pressure, numbness or tingling radiating to left arm or jaw, palpitations, dizziness, frequent headaches, changes in vision, or shortness of breath. Decreasing salt.   Sleeping well on remeron.   Right arm pain- improved with voltaren gel and lidocaine patches.   Thyroid nodule- seeing endocrine next week.   Has decreased smoking and thinks contributed to weight gain.        Colorectal Cancer Screening: due Breast Cancer Screening: Mammogram UTD Cervical Cancer Screening: abnormal 2018. Has seen Dr Davis Gourd in the past.  Lung Cancer Screening: participating  Immunizations       Tetanus - utd    Labs: Screening labs today. Exercise: Gets regular exercise.  Alcohol use: None Smoking/tobacco use: smoker, smoking less; declines Chantix, Wellbutrin.  HISTORY:  Past Medical History:  Diagnosis Date  . Brain aneurysm 1999   [facility]  . Hyperlipidemia   . Hypertension   . Rheumatoid arthritis ([place]) 2002   s/p methotrexate, humira and embrel  . Ulcer of abdomen wall [facility])     Past Surgical History:  Procedure Laterality Date  . ABDOMINAL HYSTERECTOMY  2005   ovaries remain; WITH CERVIX  . BRAIN SURGERY Left 11/27/1997   aneurysm   Family History  Problem Relation Age of Onset  . Hypertension Mother   . Aneurysm Mother 20       died  . Hyperlipidemia Sister   . Hypertension Sister   . Cancer Father 70       throat cancer  . Diabetes Maternal Aunt   . Breast cancer Maternal Aunt 65  . Hypertension Maternal Grandmother   . Stroke Maternal Grandmother   . Colon cancer Neg Hx       ALLERGIES: Penicillins; Celebrex [celecoxib]; and Lodine [etodolac]  Current Outpatient Medications  on File Prior to Visit  Medication Sig Dispense Refill  . bimatoprost (LUMIGAN) 0.01 % SOLN Place 1 drop into both eyes at bedtime.    . cyclobenzaprine (FLEXERIL) 10 MG tablet Take 1 tablet (10 mg total) by mouth 3 (three) times daily as needed for muscle spasms. 90 tablet 0  . diclofenac sodium (VOLTAREN) 1 % GEL Apply 4 g topically 4 (four) times daily. 1 Tube 3  . fluticasone (FLONASE) 50 MCG/ACT nasal spray Place 2 sprays into both nostrils daily. (Patient taking differently: Place 2 sprays into both nostrils daily as needed for allergies. ) 16 g 6  . gabapentin (NEURONTIN) 100 MG capsule TAKE 1 CAPSULE (100 MG TOTAL) BY MOUTH 3 (THREE) TIMES DAILY AS NEEDED (FOR HEADACHE/NERVE PAIN). 90 capsule 0  . hydrochlorothiazide (MICROZIDE) 12.5 MG capsule TAKE 1 CAPSULE BY MOUTH EVERY DAY 30 capsule 3  . lidocaine (LIDODERM) 5 % Place 1 patch onto the skin daily. Remove & Discard patch within 12 hours. 30 patch 0  . metoprolol tartrate (LOPRESSOR) 25 MG tablet TAKE 1 TABLET (25 MG TOTAL) BY MOUTH 2 (TWO) TIMES DAILY. 180 tablet 0  . omeprazole (PRILOSEC) 20 MG capsule Take 1 capsule (20 mg total) by mouth daily. 30 capsule 3   No current facility-administered medications on file prior to visit.     Social History   Tobacco Use  .  Smoking status: Current Every Day Smoker    Packs/day: 0.90    Years: 37.00    Pack years: 33.30  . Smokeless tobacco: Never Used  Substance Use Topics  . Alcohol use: No    Alcohol/week: 0.0 standard drinks  . Drug use: No    Review of Systems  Constitutional: Negative for chills, fever and unexpected weight change.  HENT: Negative for congestion.   Respiratory: Negative for cough.   Cardiovascular: Negative for chest pain, palpitations and leg swelling.  Gastrointestinal: Negative for nausea and vomiting.  Genitourinary: Negative for vaginal bleeding and vaginal discharge.  Musculoskeletal: Negative for arthralgias and myalgias.  Skin: Negative for rash.    Neurological: Negative for headaches.  Hematological: Negative for adenopathy.  Psychiatric/Behavioral: Negative for confusion.      Objective:    BP 140/82 (BP Location: Left Arm, Patient Position: Sitting, Cuff Size: Normal)   Pulse 80   Temp 97.9 F (36.6 C)   Ht 5' 5.25" (1.657 m)   Wt 146 lb 6.4 oz (66.4 kg)   SpO2 97%   BMI 24.18 kg/m   BP Readings from Last 3 Encounters:  09/11/18 140/82  07/20/18 (!) 158/94  01/01/18 (!) 129/94   Wt Readings from Last 3 Encounters:  09/11/18 146 lb 6.4 oz (66.4 kg)  08/17/18 138 lb (62.6 kg)  07/20/18 136 lb 4 oz (61.8 kg)    Physical Exam Vitals signs reviewed.  Constitutional:      Appearance: She is well-developed.  Eyes:     Conjunctiva/sclera: Conjunctivae normal.  Neck:     Thyroid: No thyroid mass or thyromegaly.  Cardiovascular:     Rate and Rhythm: Normal rate and regular rhythm.     Pulses: Normal pulses.     Heart sounds: Normal heart sounds.  Pulmonary:     Effort: Pulmonary effort is normal.     Breath sounds: Normal breath sounds. No wheezing, rhonchi or rales.  Chest:     Breasts: Breasts are symmetrical.        Right: No inverted nipple, mass, nipple discharge, skin change or tenderness.        Left: No inverted nipple, mass, nipple discharge, skin change or tenderness.  Genitourinary:    Cervix: No cervical motion tenderness, discharge or friability.     Uterus: Not enlarged, not fixed and not tender.      Adnexa:        Right: No mass, tenderness or fullness.         Left: No mass, tenderness or fullness.       Comments: Pap performed. No CMT. Unable to appreciated ovaries. Lymphadenopathy:     Head:     Right side of head: No submental, submandibular, tonsillar, preauricular, posterior auricular or occipital adenopathy.     Left side of head: No submental, submandibular, tonsillar, preauricular, posterior auricular or occipital adenopathy.     Cervical:     Right cervical: No superficial, deep or  posterior cervical adenopathy.    Left cervical: No superficial, deep or posterior cervical adenopathy.     Upper Body:     Right upper body: No pectoral adenopathy.     Left upper body: No pectoral adenopathy.  Skin:    General: Skin is warm and dry.  Neurological:     Mental Status: She is alert.  Psychiatric:        Speech: Speech normal.        Behavior: Behavior normal.  Thought Content: Thought content normal.        Assessment & Plan:   Problem List Items Addressed This Visit      Cardiovascular and Mediastinum   HTN (hypertension)    Elevated.  Will increase losartan.  Recheck CMP in 1 week.  Patient will monitor blood pressure at home.      Relevant Medications   mirtazapine (REMERON) 30 MG tablet   atorvastatin (LIPITOR) 20 MG tablet   losartan (COZAAR) 100 MG tablet     Other   HLD (hyperlipidemia)   Relevant Medications   atorvastatin (LIPITOR) 20 MG tablet   losartan (COZAAR) 100 MG tablet   Depression   Relevant Medications   mirtazapine (REMERON) 30 MG tablet   Routine physical examination - Primary    Clinical breast exam performed today, Pap smear performed.  Advised patient that if Pap is abnormal, she will need to see GYN.  Colonoscopy ordered.      Relevant Orders   CBC with Differential/Platelet   Comprehensive metabolic panel   Hemoglobin A1c   Lipid panel   TSH   VITAMIN D 25 Hydroxy (Vit-D Deficiency, Fractures)   Ambulatory referral to Gastroenterology    Other Visit Diagnoses    Cervical cancer screening       Relevant Orders   Cytology - PAP( [place])       I have discontinued Metztli Messimer's losartan. I am also having her start on losartan. Additionally, I am having her maintain her bimatoprost, fluticasone, cyclobenzaprine, lidocaine, diclofenac sodium, omeprazole, hydrochlorothiazide, metoprolol tartrate, gabapentin, mirtazapine, and atorvastatin.   Meds ordered this encounter  Medications  . mirtazapine (REMERON)  30 MG tablet    Sig: Take 1 tablet (30 mg total) by mouth at bedtime.    Dispense:  90 tablet    Refill:  1  . atorvastatin (LIPITOR) 20 MG tablet    Sig: Take 1 tablet (20 mg total) by mouth daily.    Dispense:  90 tablet    Refill:  2    Order Specific Question:   Supervising Provider    Answer:   Deborra Medina L [[id number]]  . losartan (COZAAR) 100 MG tablet    Sig: Take 1 tablet (100 mg total) by mouth daily.    Dispense:  90 tablet    Refill:  3    Order Specific Question:   Supervising Provider    Answer:   Crecencio Mc [[id number]]    Return precautions given.   Risks, benefits, and alternatives of the medications and treatment plan prescribed today were discussed, and patient expressed understanding.   Education regarding symptom management and diagnosis given to patient on AVS.   Continue to follow with Burnard Hawthorne, FNP for routine health maintenance.   Talmadge Coventry and I agreed with plan.   [name], FNP

## 2018-09-17 LAB — CYTOLOGY - PAP
Diagnosis: NEGATIVE
HPV: NOT DETECTED

## 2018-09-18 ENCOUNTER — Other Ambulatory Visit: Payer: BLUE CROSS/BLUE SHIELD

## 2018-09-20 ENCOUNTER — Emergency Department
Admission: EM | Admit: 2018-09-20 | Discharge: 2018-09-20 | Disposition: A | Discharge status: Home / Self Care | Payer: BLUE CROSS/BLUE SHIELD | Attending: Emergency Medicine | Admitting: Emergency Medicine

## 2018-09-20 ENCOUNTER — Emergency Department: Payer: BLUE CROSS/BLUE SHIELD

## 2018-09-20 ENCOUNTER — Other Ambulatory Visit: Payer: Self-pay

## 2018-09-20 DIAGNOSIS — M069 Rheumatoid arthritis, unspecified: Secondary | ICD-10-CM | POA: Diagnosis not present

## 2018-09-20 DIAGNOSIS — E0590 Thyrotoxicosis, unspecified without thyrotoxic crisis or storm: Secondary | ICD-10-CM | POA: Insufficient documentation

## 2018-09-20 DIAGNOSIS — I10 Essential (primary) hypertension: Secondary | ICD-10-CM | POA: Diagnosis not present

## 2018-09-20 DIAGNOSIS — E876 Hypokalemia: Secondary | ICD-10-CM | POA: Insufficient documentation

## 2018-09-20 DIAGNOSIS — R112 Nausea with vomiting, unspecified: Secondary | ICD-10-CM | POA: Insufficient documentation

## 2018-09-20 DIAGNOSIS — R197 Diarrhea, unspecified: Secondary | ICD-10-CM | POA: Insufficient documentation

## 2018-09-20 DIAGNOSIS — Z79899 Other long term (current) drug therapy: Secondary | ICD-10-CM | POA: Insufficient documentation

## 2018-09-20 DIAGNOSIS — R509 Fever, unspecified: Secondary | ICD-10-CM | POA: Diagnosis present

## 2018-09-20 DIAGNOSIS — J069 Acute upper respiratory infection, unspecified: Secondary | ICD-10-CM | POA: Diagnosis not present

## 2018-09-20 DIAGNOSIS — F17200 Nicotine dependence, unspecified, uncomplicated: Secondary | ICD-10-CM | POA: Insufficient documentation

## 2018-09-20 LAB — CBC WITH DIFFERENTIAL/PLATELET
ABS IMMATURE GRANULOCYTES: 0.01 10*3/uL (ref 0.00–0.07)
Basophils Absolute: 0 10*3/uL (ref 0.0–0.1)
Basophils Relative: 0 %
Eosinophils Absolute: 0 10*3/uL (ref 0.0–0.5)
Eosinophils Relative: 0 %
HCT: 44.5 % (ref 36.0–46.0)
Hemoglobin: 14.8 g/dL (ref 12.0–15.0)
Immature Granulocytes: 0 %
Lymphocytes Relative: 50 %
Lymphs Abs: 1.9 10*3/uL (ref 0.7–4.0)
MCH: 30.3 pg (ref 26.0–34.0)
MCHC: 33.3 g/dL (ref 30.0–36.0)
MCV: 91 fL (ref 80.0–100.0)
MONO ABS: 0.4 10*3/uL (ref 0.1–1.0)
Monocytes Relative: 11 %
Neutro Abs: 1.5 10*3/uL — ABNORMAL LOW (ref 1.7–7.7)
Neutrophils Relative %: 39 %
Platelets: 274 10*3/uL (ref 150–400)
RBC: 4.89 MIL/uL (ref 3.87–5.11)
RDW: 13.2 % (ref 11.5–15.5)
WBC: 3.9 10*3/uL — ABNORMAL LOW (ref 4.0–10.5)
nRBC: 0 % (ref 0.0–0.2)

## 2018-09-20 LAB — COMPREHENSIVE METABOLIC PANEL
ALT: 20 U/L (ref 0–44)
AST: 35 U/L (ref 15–41)
Albumin: 4.3 g/dL (ref 3.5–5.0)
Alkaline Phosphatase: 67 U/L (ref 38–126)
Anion gap: 13 (ref 5–15)
BUN: 31 mg/dL — ABNORMAL HIGH (ref 6–20)
CHLORIDE: 99 mmol/L (ref 98–111)
CO2: 23 mmol/L (ref 22–32)
CREATININE: 1.08 mg/dL — AB (ref 0.44–1.00)
Calcium: 9.4 mg/dL (ref 8.9–10.3)
GFR calc Af Amer: >60 mL/min (ref >60)
GFR, EST NON AFRICAN AMERICAN: 56 mL/min — AB (ref >60)
Glucose, Bld: 117 mg/dL — ABNORMAL HIGH (ref 70–99)
Potassium: 3.1 mmol/L — ABNORMAL LOW (ref 3.5–5.1)
Sodium: 135 mmol/L (ref 135–145)
Total Bilirubin: 0.6 mg/dL (ref 0.3–1.2)
Total Protein: 7.8 g/dL (ref 6.5–8.1)

## 2018-09-20 LAB — LIPASE, BLOOD: Lipase: 28 U/L (ref 11–51)

## 2018-09-20 LAB — TROPONIN I: Troponin I: <0.03 ng/mL (ref <0.03)

## 2018-09-20 LAB — INFLUENZA PANEL BY PCR (TYPE A & B)
Influenza A By PCR: NEGATIVE
Influenza B By PCR: NEGATIVE

## 2018-09-20 MED ORDER — AZITHROMYCIN 250 MG PO TABS: ORAL_TABLET | ORAL | 0 refills | Status: DC

## 2018-09-20 MED ORDER — ONDANSETRON HCL 4 MG/2ML IJ SOLN
4.0000 mg | Freq: Once | INTRAMUSCULAR | Status: AC
  Administered 2018-09-20: 4 mg via INTRAVENOUS
  Filled 2018-09-20: qty 2

## 2018-09-20 MED ORDER — BENZONATATE 200 MG PO CAPS: 200.0000 mg | ORAL_CAPSULE | Freq: Three times a day (TID) | ORAL | 0 refills | Status: DC | PRN

## 2018-09-20 MED ORDER — POTASSIUM CHLORIDE ER 10 MEQ PO TBCR: 10.0000 meq | EXTENDED_RELEASE_TABLET | Freq: Every day | ORAL | 0 refills | Status: DC

## 2018-09-20 MED ORDER — ONDANSETRON 4 MG PO TBDP: 4.0000 mg | ORAL_TABLET | Freq: Three times a day (TID) | ORAL | 0 refills | Status: DC | PRN

## 2018-09-20 MED ORDER — SODIUM CHLORIDE 0.9 % IV BOLUS
1000.0000 mL | Freq: Once | INTRAVENOUS | Status: AC
  Administered 2018-09-20: 1000 mL via INTRAVENOUS

## 2018-09-20 MED ORDER — IPRATROPIUM-ALBUTEROL 0.5-2.5 (3) MG/3ML IN SOLN
3.0000 mL | Freq: Once | RESPIRATORY_TRACT | Status: AC
  Administered 2018-09-20: 3 mL via RESPIRATORY_TRACT
  Filled 2018-09-20: qty 3

## 2018-09-20 MED ORDER — IPRATROPIUM-ALBUTEROL 0.5-2.5 (3) MG/3ML IN SOLN: 3.0000 mL | RESPIRATORY_TRACT | 1 refills | Status: AC | PRN

## 2018-09-20 MED ORDER — POTASSIUM CHLORIDE CRYS ER 20 MEQ PO TBCR
20.0000 meq | EXTENDED_RELEASE_TABLET | Freq: Once | ORAL | Status: AC
  Administered 2018-09-20: 20 meq via ORAL
  Filled 2018-09-20: qty 1

## 2018-09-20 NOTE — ED Triage Notes (Signed)
Pt c/o cough with congestion, bodyaches, fever since 09/15/18. States she has been taking OTC meds with no relief,.

## 2018-09-20 NOTE — ED Provider Notes (Signed)
[facility] Emergency Department Provider Note  ____________________________________________   First MD Initiated Contact with Patient 09/20/18 1525     (approximate)  I have reviewed the triage vital signs and the nursing notes.   HISTORY  Chief Complaint URI    HPI Brooke Murphy is a 57 y.o. femaleflulike symptoms, patient is complained of fever, chills, body aches.  Positive cough, denies sore throat, positive vomiting, denies diarrhea; denies chest pain or sob.  Sx for 5 days   Past Medical History:  Diagnosis Date  . Brain aneurysm 1999   [facility]  . Hyperlipidemia   . Hypertension   . Rheumatoid arthritis ([place]) 2002   s/p methotrexate, humira and embrel  . Ulcer of abdomen wall [facility])     Patient Active Problem List   Diagnosis Date Noted  . Aortic atherosclerosis ([place]) 09/09/2018  . Smoker 07/20/2018  . Goiter 07/20/2018  . Right arm pain 07/20/2018  . Encounter for screening for malignant neoplasm of respiratory organs 07/20/2018  . Headache 11/17/2017  . Sinus pressure 11/17/2017  . Hyperthyroidism 04/29/2017  . Routine physical examination 02/12/2017  . GERD (gastroesophageal reflux disease) 08/27/2016  . HLD (hyperlipidemia) 08/27/2016  . Depression 08/27/2016  . Right shoulder pain 06/15/2015  . Arthritis 04/08/2015  . HTN (hypertension) 12/10/2014  . Allergic rhinitis 12/10/2014  . Thoracic back pain 12/10/2014    Past Surgical History:  Procedure Laterality Date  . ABDOMINAL HYSTERECTOMY  2005   ovaries remain; WITH CERVIX  . BRAIN SURGERY Left 11/27/1997   aneurysm    Prior to Admission medications   Medication Sig Start Date End Date Taking? Authorizing Provider  atorvastatin (LIPITOR) 20 MG tablet Take 1 tablet (20 mg total) by mouth daily. 09/11/18   Burnard Hawthorne, FNP  azithromycin (ZITHROMAX Z-PAK) 250 MG tablet 2 pills today then 1 pill a day for 4 days 09/20/18   Caryn Section Linden Dolin, PA-C  benzonatate (TESSALON)  200 MG capsule Take 1 capsule (200 mg total) by mouth 3 (three) times daily as needed for cough. 09/20/18   Ruthmary Occhipinti, Linden Dolin, PA-C  bimatoprost (LUMIGAN) 0.01 % SOLN Place 1 drop into both eyes at bedtime.    [provider]  diclofenac sodium (VOLTAREN) 1 % GEL Apply 4 g topically 4 (four) times daily. 07/20/18   Burnard Hawthorne, FNP  fluticasone (FLONASE) 50 MCG/ACT nasal spray Place 2 sprays into both nostrils daily. Patient taking differently: Place 2 sprays into both nostrils daily as needed for allergies.  04/26/14   Rey, Latina Craver, NP  gabapentin (NEURONTIN) 100 MG capsule TAKE 1 CAPSULE (100 MG TOTAL) BY MOUTH 3 (THREE) TIMES DAILY AS NEEDED (FOR HEADACHE/NERVE PAIN). 08/12/18   Burnard Hawthorne, FNP  hydrochlorothiazide (MICROZIDE) 12.5 MG capsule TAKE 1 CAPSULE BY MOUTH EVERY DAY 08/07/18   Burnard Hawthorne, FNP  ipratropium-albuterol (DUONEB) 0.5-2.5 (3) MG/3ML SOLN Take 3 mLs by nebulization every 4 (four) hours as needed. 09/20/18   Irl Bodie, Linden Dolin, PA-C  lidocaine (LIDODERM) 5 % Place 1 patch onto the skin daily. Remove & Discard patch within 12 hours. 07/20/18   Burnard Hawthorne, FNP  losartan (COZAAR) 100 MG tablet Take 1 tablet (100 mg total) by mouth daily. 09/11/18   Burnard Hawthorne, FNP  metoprolol tartrate (LOPRESSOR) 25 MG tablet TAKE 1 TABLET (25 MG TOTAL) BY MOUTH 2 (TWO) TIMES DAILY. 08/07/18   Burnard Hawthorne, FNP  mirtazapine (REMERON) 30 MG tablet Take 1 tablet (30 mg total)  by mouth at bedtime. 09/11/18   Burnard Hawthorne, FNP  montelukast (SINGULAIR) 10 MG tablet TAKE 1 TABLET BY MOUTH EVERYDAY AT BEDTIME 09/11/18   Burnard Hawthorne, FNP  omeprazole (PRILOSEC) 20 MG capsule Take 1 capsule (20 mg total) by mouth daily. 08/07/18   Burnard Hawthorne, FNP  ondansetron (ZOFRAN-ODT) 4 MG disintegrating tablet Take 1 tablet (4 mg total) by mouth every 8 (eight) hours as needed for nausea or vomiting. 09/20/18   Caryn Section Linden Dolin, PA-C  potassium chloride  (K-DUR) 10 MEQ tablet Take 1 tablet (10 mEq total) by mouth daily. 09/20/18   Versie Starks, PA-C    Allergies Penicillins; Celebrex [celecoxib]; and Lodine [etodolac]  Family History  Problem Relation Age of Onset  . Hypertension Mother   . Aneurysm Mother 63       died  . Hyperlipidemia Sister   . Hypertension Sister   . Cancer Father 70       throat cancer  . Diabetes Maternal Aunt   . Breast cancer Maternal Aunt 65  . Hypertension Maternal Grandmother   . Stroke Maternal Grandmother   . Colon cancer Neg Hx     Social History Social History   Tobacco Use  . Smoking status: Current Every Day Smoker    Packs/day: 0.90    Years: 37.00    Pack years: 33.30  . Smokeless tobacco: Never Used  Substance Use Topics  . Alcohol use: No    Alcohol/week: 0.0 standard drinks  . Drug use: No    Review of Systems  Constitutional: Positive fever/chills Eyes: No visual changes. ENT: No sore throat. Respiratory: Positive cough Gastrointestinal: Positive vomiting Genitourinary: Negative for dysuria. Musculoskeletal: Negative for back pain. Skin: Negative for rash.    ____________________________________________   PHYSICAL EXAM:  VITAL SIGNS: ED Triage Vitals  Enc Vitals Group     BP 09/20/18 1544 133/82     Pulse Rate 09/20/18 1544 78     Resp 09/20/18 1544 18     Temp --      Temp src --      SpO2 09/20/18 1544 95 %     Weight --      Height --      Head Circumference --      Peak Flow --      Pain Score 09/20/18 1353 0     Pain Loc --      Pain Edu? --      Excl. in [place]? --     Constitutional: Alert and oriented. Well appearing and in no acute distress. Eyes: Conjunctivae are normal.  Head: Atraumatic. Nose: No congestion/rhinnorhea. Mouth/Throat: Mucous membranes are moist.   Neck:  supple no lymphadenopathy noted Cardiovascular: Normal rate, regular rhythm. Heart sounds are normal Respiratory: Normal respiratory effort.  No retractions, lungs c t  a, cough is wet and rattly Abd: soft nontender bs normal all 4 quad GU: deferred Musculoskeletal: FROM all extremities, warm and well perfused Neurologic:  Normal speech and language.  Skin:  Skin is warm, dry and intact. No rash noted. Psychiatric: Mood and affect are normal. Speech and behavior are normal.  ____________________________________________   LABS (all labs ordered are listed, but only abnormal results are displayed)  Labs Reviewed  COMPREHENSIVE METABOLIC PANEL - Abnormal; Notable for the following components:      Result Value   Potassium 3.1 (*)    Glucose, Bld 117 (*)    BUN 31 (*)    Creatinine,  Ser 1.08 (*)    GFR calc non Af Amer 56 (*)    All other components within normal limits  CBC WITH DIFFERENTIAL/PLATELET - Abnormal; Notable for the following components:   WBC 3.9 (*)    Neutro Abs 1.5 (*)    All other components within normal limits  LIPASE, BLOOD  TROPONIN I  INFLUENZA PANEL BY PCR (TYPE A & B)   ____________________________________________   ____________________________________________  RADIOLOGY  Chest x-ray is negative for pneumonia but does show scarring and COPD  ____________________________________________   PROCEDURES  Procedure(s) performed: DuoNeb, saline lock, normal saline 1 L IV, Zofran 4 mg IV, potassium 20 M EQ's p.o.  Procedures    ____________________________________________   INITIAL IMPRESSION / ASSESSMENT AND PLAN / ED COURSE  Pertinent labs & imaging results that were available during my care of the patient were reviewed by me and considered in my medical decision making (see chart for details).   Patient is 57 year old female presents emergency department with flulike symptoms.  Physical exam shows a nontoxic female.  Cough is wet and congested.  Remainder of exam is unremarkable  Saline lock, normal saline 1 L IV, Zofran 4 mg IV  Labs for influenza are negative, comprehensive metabolic panel shows a  decreased potassium at 3.1, troponin is negative, lipase is negative, CBC with decreased white blood cell count at 3.9 which could be due to the viral illness.   Chest x-ray is negative for pneumonia  Explained all of the lab results and chest x-ray results to the patient.  She was given potassium 20 M EQ's p.o. while here in the ED.  A prescription for K-Lor 10 M EQ's daily for 5 days.  She is to follow-up with regular doctor have her potassium rechecked in 1 week.  Prescription for Z-Pak and DuoNeb Nebules.  She was given the tubing while here in the ED and she has a nebulizer machine that she can use.  She is to follow-up with her regular doctor for recheck.  Return emergency department worsening.  She and her family member state they understand and will comply.  She is discharged stable condition.  As part of my medical decision making, I reviewed the following data within the [place] History obtained from family, Nursing notes reviewed and incorporated, Labs reviewed see above, Old chart reviewed, Notes from prior ED visits and [place] Controlled Substance Database  ____________________________________________   FINAL CLINICAL IMPRESSION(S) / ED DIAGNOSES  Final diagnoses:  Acute upper respiratory infection  Nausea vomiting and diarrhea  Hypokalemia      NEW MEDICATIONS STARTED DURING THIS VISIT:  Discharge Medication List as of 09/20/2018  3:37 PM    START taking these medications   Details  azithromycin (ZITHROMAX Z-PAK) 250 MG tablet 2 pills today then 1 pill a day for 4 days, Normal    benzonatate (TESSALON) 200 MG capsule Take 1 capsule (200 mg total) by mouth 3 (three) times daily as needed for cough., Starting Sun 09/20/2018, Normal    ipratropium-albuterol (DUONEB) 0.5-2.5 (3) MG/3ML SOLN Take 3 mLs by nebulization every 4 (four) hours as needed., Starting Sun 09/20/2018, Normal    ondansetron (ZOFRAN-ODT) 4 MG disintegrating tablet Take 1 tablet (4 mg  total) by mouth every 8 (eight) hours as needed for nausea or vomiting., Starting Sun 09/20/2018, Normal    potassium chloride (K-DUR) 10 MEQ tablet Take 1 tablet (10 mEq total) by mouth daily., Starting Sun 09/20/2018, Normal  Note:  This document was prepared using Dragon voice recognition software and may include unintentional dictation errors.    Versie Starks, PA-C 09/20/18 1646    Carrie Mew, MD 09/28/18 [phone]

## 2018-09-20 NOTE — Discharge Instructions (Signed)
Follow-up with your regular doctor.  Please schedule appointment.  Take medications as prescribed.  If you are worsening return emergency department.

## 2018-09-21 ENCOUNTER — Ambulatory Visit: Payer: Self-pay | Admitting: *Deleted

## 2018-09-21 ENCOUNTER — Telehealth: Payer: Self-pay | Admitting: Family

## 2018-09-21 ENCOUNTER — Ambulatory Visit: Payer: BLUE CROSS/BLUE SHIELD | Admitting: Internal Medicine

## 2018-09-21 ENCOUNTER — Other Ambulatory Visit: Payer: BLUE CROSS/BLUE SHIELD

## 2018-09-21 ENCOUNTER — Encounter: Payer: Self-pay | Admitting: Internal Medicine

## 2018-09-21 VITALS — BP 126/86 | HR 63 | Temp 98.1°F | Resp 16 | Ht 65.25 in | Wt 141.4 lb

## 2018-09-21 DIAGNOSIS — J441 Chronic obstructive pulmonary disease with (acute) exacerbation: Secondary | ICD-10-CM

## 2018-09-21 DIAGNOSIS — E876 Hypokalemia: Secondary | ICD-10-CM | POA: Diagnosis not present

## 2018-09-21 MED ORDER — GUAIFENESIN-CODEINE 100-10 MG/5ML PO SYRP: 5.0000 mL | ORAL_SOLUTION | Freq: Three times a day (TID) | ORAL | 0 refills | Status: DC | PRN

## 2018-09-21 MED ORDER — PREDNISONE 10 MG PO TABS: ORAL_TABLET | ORAL | 0 refills | Status: DC

## 2018-09-21 MED ORDER — LEVOFLOXACIN 500 MG PO TABS: 500.0000 mg | ORAL_TABLET | Freq: Every day | ORAL | 0 refills | Status: DC

## 2018-09-21 NOTE — Telephone Encounter (Signed)
Contacted pt regarding symptoms; she states that she was given medicine yesterday and she has been having diarrhea sine then she hasn't been able to eat anything; she says that she has had diarrhea since 12/10/28/17; the pt states that she had flu like symptom since 12/34/19, but her diarrhea has worsened and she is not able to keep anything down due to watery diarrhea; she was seen in the ED 09/20/18; recommendations made per nurse triage protocol; pt previously scheduled appointment per [place], 09/21/18 at  1400 with Dr Derrel Nip, [facility]; she verbalized understanding; will route to office for notification of upcoming appointment.    Reason for Disposition . [1] MODERATE diarrhea (e.g., 4-6 times / day more than normal) AND [2] present > 48 hours (2 days)  Answer Assessment - Initial Assessment Questions 1. DIARRHEA SEVERITY: "How bad is the diarrhea?" "How many extra stools have you had in the past 24 hours than normal?"    - NO DIARRHEA (SCALE 0)   - MILD (SCALE 1-3): Few loose or mushy BMs; increase of 1-3 stools over normal daily number of stools; mild increase in ostomy output.   -  MODERATE (SCALE 4-7): Increase of 4-6 stools daily over normal; moderate increase in ostomy output. * SEVERE (SCALE 8-10; OR 'WORST POSSIBLE'): Increase of 7 or more stools daily over normal; moderate increase in ostomy output; incontinence.     Moderate 5-6 2. ONSET: "When did the diarrhea begin?"      09/16/18 3. BM CONSISTENCY: "How loose or watery is the diarrhea?"      watery 4. VOMITING: "Are you also vomiting?" If so, ask: "How many times in the past 24 hours?"      no 5. ABDOMINAL PAIN: "Are you having any abdominal pain?" If yes: "What does it feel like?" (e.g., crampy, dull, intermittent, constant)      "soreness", dull 6. ABDOMINAL PAIN SEVERITY: If present, ask: "How bad is the pain?"  (e.g., Scale 1-10; mild, moderate, or severe)   - MILD (1-3): doesn't interfere with normal  activities, abdomen soft and not tender to touch    - MODERATE (4-7): interferes with normal activities or awakens from sleep, tender to touch    - SEVERE (8-10): excruciating pain, doubled over, unable to do any normal activities       moderate 7. ORAL INTAKE: If vomiting, "Have you been able to drink liquids?" "How much fluids have you had in the past 24 hours?"     No vomiting 3 quarts of smart water, 16 oz ginger ale  8. HYDRATION: "Any signs of dehydration?" (e.g., dry mouth [not just dry lips], too weak to stand, dizziness, new weight loss) "When did you last urinate?"     Last urinated 09/21/18 at 0800; light headed when standing up 9. EXPOSURE: "Have you traveled to a foreign country recently?" "Have you been exposed to anyone with diarrhea?" "Could you have eaten any food that was spoiled?"     no 10. ANTIBIOTIC USE: "Are you taking antibiotics now or have you taken antibiotics in the past 2 months?"       Yes; z-pak started 12/39/19 11. OTHER SYMPTOMS: "Do you have any other symptoms?" (e.g., fever, blood in stool)       no 12. PREGNANCY: "Is there any chance you are pregnant?" "When was your last menstrual period?"       No hysterectomy  Protocols used: DIARRHEA-A-AH

## 2018-09-21 NOTE — Telephone Encounter (Signed)
Call patient She is being seen today in our clinic by Dr. Derrel Nip. Please ensure she has a follow-up appointment with me as I want to discuss with her whether or not she needs potassium supplementation.  Her potassium was low in the emergency room and it was replenished.  She is currently on hydrochlorothiazide which can make potassium low, as does vomiting diarrhea.  Please ensure she understands she is to follow-up with me so we can discuss if she needs a constant prescription of potassium.

## 2018-09-21 NOTE — Telephone Encounter (Signed)
Called Pt to schedule a appt for 09/28/2018 @4 :00pm for OV with you. Pt stated she is taking Potassium and that she was having diarrhea but not anymore.

## 2018-09-21 NOTE — Patient Instructions (Signed)
  You are having a COPD exacerbation   Prednisone tapering dose for the next 6 days   Levaquin once daily  For   7 days (antibiotic) Continue your duonebs 4 times daily .  Use your albuterol inhaler as needed  Stop the azithromycin  Cheratussin for cough .  Every 8 hours  (sedating ; has codeine in it)   Please take a probiotic ( Align, Floraque or Culturelle) or the generic version of one of these,  Or eat a serving of yogurt once daily   For a minimum of 3 weeks to prevent a serious antibiotic associated diarrhea  Called clostridium dificile colitis

## 2018-09-21 NOTE — Telephone Encounter (Signed)
noted 

## 2018-09-21 NOTE — Telephone Encounter (Signed)
Duplicate; charted in error

## 2018-09-21 NOTE — Progress Notes (Signed)
Subjective:  Patient ID: Brooke Murphy, female    DOB: 12-Feb-1961  Age: 57 y.o. MRN: [id number]  CC: The primary encounter diagnosis was Hypokalemia. A diagnosis of COPD with acute exacerbation ([place]) was also pertinent to this visit.  HPI Brooke Murphy presents for ER follow up.  Treated in ED on Dec 29 for signs and symptoms that started Dec 24 with chills ,  Aches,  Followed by n/v/d on Dec 25th ,  Was given  IV fluids,  z pack  For URI with vomiting and diarrhea symptoms had been present for 5 days,  Low potassium addressed as well .  currently endorsing persistent sore throat, , diarrhea appears to have resolved  after having had 4 episodes overnight ,  None since  10 am this morning , still having Chest tightness and productive cough   With right sided blood streaked nasal dischage and sinus pain   Has COPD ,  Chest x ray noted no infitrates,  But severe bullous  Changes in the upper lobes with hyperinflation. Has been using home nebulizer   Outpatient Medications Prior to Visit  Medication Sig Dispense Refill  . atorvastatin (LIPITOR) 20 MG tablet Take 1 tablet (20 mg total) by mouth daily. 90 tablet 2  . benzonatate (TESSALON) 200 MG capsule Take 1 capsule (200 mg total) by mouth 3 (three) times daily as needed for cough. 30 capsule 0  . bimatoprost (LUMIGAN) 0.01 % SOLN Place 1 drop into both eyes at bedtime.    . diclofenac sodium (VOLTAREN) 1 % GEL Apply 4 g topically 4 (four) times daily. 1 Tube 3  . fluticasone (FLONASE) 50 MCG/ACT nasal spray Place 2 sprays into both nostrils daily. (Patient taking differently: Place 2 sprays into both nostrils daily as needed for allergies. ) 16 g 6  . gabapentin (NEURONTIN) 100 MG capsule TAKE 1 CAPSULE (100 MG TOTAL) BY MOUTH 3 (THREE) TIMES DAILY AS NEEDED (FOR HEADACHE/NERVE PAIN). 90 capsule 0  . hydrochlorothiazide (MICROZIDE) 12.5 MG capsule TAKE 1 CAPSULE BY MOUTH EVERY DAY 30 capsule 3  . ipratropium-albuterol (DUONEB) 0.5-2.5 (3) MG/3ML  SOLN Take 3 mLs by nebulization every 4 (four) hours as needed. 360 mL 1  . lidocaine (LIDODERM) 5 % Place 1 patch onto the skin daily. Remove & Discard patch within 12 hours. 30 patch 0  . losartan (COZAAR) 100 MG tablet Take 1 tablet (100 mg total) by mouth daily. 90 tablet 3  . metoprolol tartrate (LOPRESSOR) 25 MG tablet TAKE 1 TABLET (25 MG TOTAL) BY MOUTH 2 (TWO) TIMES DAILY. 180 tablet 0  . mirtazapine (REMERON) 30 MG tablet Take 1 tablet (30 mg total) by mouth at bedtime. 90 tablet 1  . montelukast (SINGULAIR) 10 MG tablet TAKE 1 TABLET BY MOUTH EVERYDAY AT BEDTIME 30 tablet 3  . omeprazole (PRILOSEC) 20 MG capsule Take 1 capsule (20 mg total) by mouth daily. 30 capsule 3  . ondansetron (ZOFRAN-ODT) 4 MG disintegrating tablet Take 1 tablet (4 mg total) by mouth every 8 (eight) hours as needed for nausea or vomiting. 20 tablet 0  . potassium chloride (K-DUR) 10 MEQ tablet Take 1 tablet (10 mEq total) by mouth daily. 10 tablet 0  . azithromycin (ZITHROMAX Z-PAK) 250 MG tablet 2 pills today then 1 pill a day for 4 days 6 each 0  . naproxen (NAPROSYN) 500 MG tablet   0   No facility-administered medications prior to visit.     Review of Systems;  Patient denies headache,  fevers, malaise, unintentional weight loss, skin rash, eye pain, sinus congestion and sinus pain, sore throat, dysphagia,  hemoptysis ,, chest pain, palpitations, orthopnea, edema, abdominal pain, nausea, melena, diarrhea, constipation, flank pain, dysuria, hematuria, urinary  Frequency, nocturia, numbness, tingling, seizures,  Focal weakness, Loss of consciousness,  Tremor, insomnia, depression, anxiety, and suicidal ideation.      Objective:  BP 126/86 (BP Location: Left Arm, Patient Position: Sitting, Cuff Size: Normal)   Pulse 63   Temp 98.1 F (36.7 C) (Oral)   Resp 16   Ht 5' 5.25" (1.657 m)   Wt 141 lb 6.4 oz (64.1 kg)   SpO2 99%   BMI 23.35 kg/m   BP Readings from Last 3 Encounters:  09/21/18 126/86    09/20/18 133/82  09/11/18 140/82    Wt Readings from Last 3 Encounters:  09/21/18 141 lb 6.4 oz (64.1 kg)  09/11/18 146 lb 6.4 oz (66.4 kg)  08/17/18 138 lb (62.6 kg)    General appearance: alert, cooperative and appears stated age Ears: normal TM's and external ear canals both ears Throat: lips, mucosa, and tongue normal; teeth and gums normal Neck: no adenopathy, no carotid bruit, supple, symmetrical, trachea midline and thyroid not enlarged, symmetric, no tenderness/mass/nodules Back: symmetric, no curvature. ROM normal. No CVA tenderness. Lungs: clear to auscultation bilaterally Heart: regular rate and rhythm, S1, S2 normal, no murmur, click, rub or gallop Abdomen: soft, non-tender; bowel sounds normal; no masses,  no organomegaly Pulses: 2+ and symmetric Skin: Skin color, texture, turgor normal. No rashes or lesions Lymph nodes: Cervical, supraclavicular, and axillary nodes normal.  Lab Results  Component Value Date   HGBA1C 6.2 02/12/2017   HGBA1C 5.9 08/27/2016    Lab Results  Component Value Date   CREATININE 1.08 (H) 09/20/2018   CREATININE 0.81 01/01/2018   CREATININE 0.72 04/28/2017    Lab Results  Component Value Date   WBC 3.9 (L) 09/20/2018   HGB 14.8 09/20/2018   HCT 44.5 09/20/2018   PLT 274 09/20/2018   GLUCOSE 117 (H) 09/20/2018   CHOL 214 (H) 02/12/2017   TRIG 123.0 02/12/2017   HDL 49.50 02/12/2017   LDLCALC 140 (H) 02/12/2017   ALT 20 09/20/2018   AST 35 09/20/2018   NA 135 09/20/2018   K 3.1 (L) 09/20/2018   CL 99 09/20/2018   CREATININE 1.08 (H) 09/20/2018   BUN 31 (H) 09/20/2018   CO2 23 09/20/2018   TSH 0.798 01/01/2018   HGBA1C 6.2 02/12/2017    No results found.  Assessment & Plan:   Problem List Items Addressed This Visit    COPD with acute exacerbation [facility])    ER visit reviewed and prior imaging studies reviewed.  She has not had PFs/pulmonary  evaluation but Vital signs normal, not hypoxic or tachypneic Prednisone taper  and levaquin added.  azthromycin stopped.  Probiotic advised already using nebs at home       Relevant Medications   guaiFENesin-codeine (CHERATUSSIN AC) 100-10 MG/5ML syrup   predniSONE (DELTASONE) 10 MG tablet   Hypokalemia - Primary    Noted during ER evaluation. Mg not checked.  Given 10 meq supplement at discharge to take for several days.  repeat labs needed .   Lab Results  Component Value Date   NA 135 09/20/2018   K 3.1 (L) 09/20/2018   CL 99 09/20/2018   CO2 23 09/20/2018         Relevant Orders   Magnesium   Basic metabolic panel  I have discontinued Adie Petropoulos's azithromycin. I am also having her start on guaiFENesin-codeine, levofloxacin, and predniSONE. Additionally, I am having her maintain her bimatoprost, fluticasone, lidocaine, diclofenac sodium, omeprazole, hydrochlorothiazide, metoprolol tartrate, gabapentin, montelukast, mirtazapine, atorvastatin, losartan, potassium chloride, ipratropium-albuterol, ondansetron, benzonatate, and naproxen.  Meds ordered this encounter  Medications  . guaiFENesin-codeine (CHERATUSSIN AC) 100-10 MG/5ML syrup    Sig: Take 5 mLs by mouth 3 (three) times daily as needed for cough.    Dispense:  120 mL    Refill:  0  . levofloxacin (LEVAQUIN) 500 MG tablet    Sig: Take 1 tablet (500 mg total) by mouth daily.    Dispense:  7 tablet    Refill:  0  . predniSONE (DELTASONE) 10 MG tablet    Sig: 6 tablets on Day 1 , then reduce by 1 tablet daily until gone    Dispense:  21 tablet    Refill:  0    Medications Discontinued During This Encounter  Medication Reason  . azithromycin (ZITHROMAX Z-PAK) 250 MG tablet Completed Course    Follow-up: No follow-ups on file.   Crecencio Mc, MD

## 2018-09-23 DIAGNOSIS — J441 Chronic obstructive pulmonary disease with (acute) exacerbation: Secondary | ICD-10-CM | POA: Insufficient documentation

## 2018-09-23 DIAGNOSIS — E876 Hypokalemia: Secondary | ICD-10-CM | POA: Insufficient documentation

## 2018-09-23 NOTE — Assessment & Plan Note (Addendum)
ER visit reviewed and prior imaging studies reviewed.  She has not had PFs/pulmonary  evaluation but Vital signs normal, not hypoxic or tachypneic Prednisone taper and levaquin added.  azthromycin stopped.  Probiotic advised already using nebs at home

## 2018-09-23 NOTE — Assessment & Plan Note (Signed)
Noted during ER evaluation. Mg not checked.  Given 10 meq supplement at discharge to take for several days.  repeat labs needed .   Lab Results  Component Value Date   NA 135 09/20/2018   K 3.1 (L) 09/20/2018   CL 99 09/20/2018   CO2 23 09/20/2018

## 2018-09-28 ENCOUNTER — Encounter: Payer: Self-pay | Admitting: Family

## 2018-09-28 ENCOUNTER — Telehealth: Payer: Self-pay | Admitting: Radiology

## 2018-09-28 ENCOUNTER — Ambulatory Visit (INDEPENDENT_AMBULATORY_CARE_PROVIDER_SITE_OTHER): Payer: BLUE CROSS/BLUE SHIELD | Admitting: Family

## 2018-09-28 VITALS — BP 140/84 | HR 79 | Temp 98.0°F | Wt 147.4 lb

## 2018-09-28 DIAGNOSIS — J3489 Other specified disorders of nose and nasal sinuses: Secondary | ICD-10-CM

## 2018-09-28 DIAGNOSIS — I10 Essential (primary) hypertension: Secondary | ICD-10-CM

## 2018-09-28 DIAGNOSIS — Z0000 Encounter for general adult medical examination without abnormal findings: Secondary | ICD-10-CM | POA: Diagnosis not present

## 2018-09-28 DIAGNOSIS — E876 Hypokalemia: Secondary | ICD-10-CM | POA: Diagnosis not present

## 2018-09-28 MED ORDER — AMLODIPINE BESYLATE 5 MG PO TABS: 5.0000 mg | ORAL_TABLET | Freq: Every day | ORAL | 3 refills | Status: DC

## 2018-09-28 NOTE — Patient Instructions (Addendum)
Please ensure you have your Eye exam with Dr Gloriann Loan.  Stop hctz since you have very low potassium We will start amlodipine in it's place.   Monitor blood pressure,  Goal is less than 120/80, based on newest guidelines; if persistently higher, please make sooner follow up appointment so we can recheck you blood pressure and manage medications   In Lab, get Dr Lupita Dawn labs and mine - with the exception on lipid panel.   Trial of flonase every day and see if left sinus/ eye pressure improves. Let me know if ANY new or worsening features as I would advise we consult either ENT again or neurologist

## 2018-09-28 NOTE — Assessment & Plan Note (Signed)
Elevated today, patient is not currently on HCTZ.  Advised her to stay off of HCTZ due to hypokalemia.  She will finish potassium chloride which was given to her from Dr Derrel Nip. We will recheck labs today.  Advised for blood pressure control to go ahead and start amlodipine.  Patient monitor blood pressure at home.  She will let me know how her blood pressure is doing

## 2018-09-28 NOTE — Assessment & Plan Note (Signed)
Left-sided sinus "pressure", considering atypical headache presentation.  Reassured by normal neurologic exam today.  Unchanged and triggered by cold weather.  Patient declines imaging of the brain today.  Advised her to stay consistent with the Flonase, monitor for new symptoms, if symptoms were to persist and certainly new symptoms develop, I advised like her I would like to consult  ear nose and throat and/or neurology.

## 2018-09-28 NOTE — Progress Notes (Signed)
Subjective:    Patient ID: Brooke Murphy, female    DOB: Feb 15, 1961, 58 y.o.   MRN: [id number]  CC: Brooke Murphy is a 58 y.o. female who presents today for follow up.   HPI: Complains  left eye with pressure for years, more recently has become more noticeable. 'Similar to headache however more pressure.' 'not a migraine.'  Left eye vision is blurry for a couple of minutes , then resolves.   Happens everytime with weather changes,  Particularly with cold air. Only times this happens, is if she forgets to wear hat. Responds with tyelonol severe sinus with relief.  She is been seen by ear nose and throat in the past, states she was told she needed sinus surgery.  No ear pain, hearing loss, vomiting , nausea, fever, dizziness.  Not on flonase currently.   Wears glasses- eye exam scheduled  Cough has resolved with prednisone from Dr Derrel Nip. Former smoker. Feels like she had the flu.  H/o hypokalemia- stopped the hctz.  potassium  HTN-losartan 100 mg once a day, metroprolol.  No chest pain, shortness of breath    Seen for hypokalemia 08/3018.  HISTORY:  Past Medical History:  Diagnosis Date  . Brain aneurysm 1999   [facility]  . Hyperlipidemia   . Hypertension   . Rheumatoid arthritis ([place]) 2002   s/p methotrexate, humira and embrel  . Ulcer of abdomen wall [facility])    Past Surgical History:  Procedure Laterality Date  . ABDOMINAL HYSTERECTOMY  2005   ovaries remain; WITH CERVIX  . BRAIN SURGERY Left 11/27/1997   aneurysm   Family History  Problem Relation Age of Onset  . Hypertension Mother   . Aneurysm Mother 55       died  . Hyperlipidemia Sister   . Hypertension Sister   . Cancer Father 70       throat cancer  . Diabetes Maternal Aunt   . Breast cancer Maternal Aunt 65  . Hypertension Maternal Grandmother   . Stroke Maternal Grandmother   . Colon cancer Neg Hx     Allergies: Penicillins; Celebrex [celecoxib]; and Lodine [etodolac] Current Outpatient Medications on File  Prior to Visit  Medication Sig Dispense Refill  . atorvastatin (LIPITOR) 20 MG tablet Take 1 tablet (20 mg total) by mouth daily. 90 tablet 2  . bimatoprost (LUMIGAN) 0.01 % SOLN Place 1 drop into both eyes at bedtime.    . diclofenac sodium (VOLTAREN) 1 % GEL Apply 4 g topically 4 (four) times daily. 1 Tube 3  . fluticasone (FLONASE) 50 MCG/ACT nasal spray Place 2 sprays into both nostrils daily. (Patient taking differently: Place 2 sprays into both nostrils daily as needed for allergies. ) 16 g 6  . gabapentin (NEURONTIN) 100 MG capsule TAKE 1 CAPSULE (100 MG TOTAL) BY MOUTH 3 (THREE) TIMES DAILY AS NEEDED (FOR HEADACHE/NERVE PAIN). 90 capsule 0  . ipratropium-albuterol (DUONEB) 0.5-2.5 (3) MG/3ML SOLN Take 3 mLs by nebulization every 4 (four) hours as needed. 360 mL 1  . levofloxacin (LEVAQUIN) 500 MG tablet Take 1 tablet (500 mg total) by mouth daily. 7 tablet 0  . lidocaine (LIDODERM) 5 % Place 1 patch onto the skin daily. Remove & Discard patch within 12 hours. 30 patch 0  . losartan (COZAAR) 100 MG tablet Take 1 tablet (100 mg total) by mouth daily. 90 tablet 3  . metoprolol tartrate (LOPRESSOR) 25 MG tablet TAKE 1 TABLET (25 MG TOTAL) BY MOUTH 2 (TWO) TIMES DAILY. 180 tablet 0  .  mirtazapine (REMERON) 30 MG tablet Take 1 tablet (30 mg total) by mouth at bedtime. 90 tablet 1  . montelukast (SINGULAIR) 10 MG tablet TAKE 1 TABLET BY MOUTH EVERYDAY AT BEDTIME 30 tablet 3  . omeprazole (PRILOSEC) 20 MG capsule Take 1 capsule (20 mg total) by mouth daily. 30 capsule 3  . potassium chloride (K-DUR) 10 MEQ tablet Take 1 tablet (10 mEq total) by mouth daily. 10 tablet 0   No current facility-administered medications on file prior to visit.     Social History   Tobacco Use  . Smoking status: Former Smoker    Packs/day: 0.90    Years: 37.00    Pack years: 33.30    Last attempt to quit: 09/03/2018    Years since quitting: 0.0  . Smokeless tobacco: Never Used  Substance Use Topics  .  Alcohol use: No    Alcohol/week: 0.0 standard drinks  . Drug use: No    Review of Systems  Constitutional: Negative for chills and fever.  HENT: Positive for congestion, sinus pressure and sinus pain. Negative for ear discharge, ear pain and sore throat.   Respiratory: Negative for cough, shortness of breath and wheezing.   Cardiovascular: Negative for chest pain and palpitations.  Gastrointestinal: Negative for nausea and vomiting.  Neurological: Positive for headaches.      Objective:    BP 140/84 (BP Location: Left Arm, Patient Position: Sitting, Cuff Size: Normal)   Pulse 79   Temp 98 F (36.7 C)   Wt 147 lb 6.4 oz (66.9 kg)   SpO2 97%   BMI 24.34 kg/m  BP Readings from Last 3 Encounters:  09/28/18 140/84  09/21/18 126/86  09/20/18 133/82   Wt Readings from Last 3 Encounters:  09/28/18 147 lb 6.4 oz (66.9 kg)  09/21/18 141 lb 6.4 oz (64.1 kg)  09/11/18 146 lb 6.4 oz (66.4 kg)    Physical Exam Vitals signs reviewed.  Constitutional:      Appearance: She is well-developed.  HENT:     Head: Normocephalic and atraumatic.      Comments: Left-sided orbital area as marked on diagram where patient describes pressure    Right Ear: Hearing, tympanic membrane, ear canal and external ear normal. No decreased hearing noted. No drainage, swelling or tenderness. No middle ear effusion. No foreign body. Tympanic membrane is not erythematous or bulging.     Left Ear: Hearing, tympanic membrane, ear canal and external ear normal. No decreased hearing noted. No drainage, swelling or tenderness.  No middle ear effusion. No foreign body. Tympanic membrane is not erythematous or bulging.     Nose: Nose normal. No rhinorrhea.     Right Sinus: No maxillary sinus tenderness or frontal sinus tenderness.     Left Sinus: No maxillary sinus tenderness or frontal sinus tenderness.     Mouth/Throat:     Pharynx: Uvula midline. No oropharyngeal exudate, posterior oropharyngeal erythema or uvula  swelling.     Tonsils: No tonsillar abscesses.  Eyes:     General: Lids are normal. Lids are everted, no foreign bodies appreciated.     Conjunctiva/sclera: Conjunctivae normal.     Pupils: Pupils are equal, round, and reactive to light.  Cardiovascular:     Rate and Rhythm: Normal rate and regular rhythm.     Pulses: Normal pulses.     Heart sounds: Normal heart sounds.  Pulmonary:     Effort: Pulmonary effort is normal.     Breath sounds: Normal breath sounds. No  wheezing, rhonchi or rales.  Lymphadenopathy:     Head:     Right side of head: No submental, submandibular, tonsillar, preauricular, posterior auricular or occipital adenopathy.     Left side of head: No submental, submandibular, tonsillar, preauricular, posterior auricular or occipital adenopathy.     Cervical: No cervical adenopathy.     Right cervical: No superficial, deep or posterior cervical adenopathy.    Left cervical: No superficial, deep or posterior cervical adenopathy.  Skin:    General: Skin is warm and dry.  Neurological:     Mental Status: She is alert.     Cranial Nerves: No cranial nerve deficit.     Sensory: No sensory deficit.     Deep Tendon Reflexes:     Reflex Scores:      Bicep reflexes are 2+ on the right side and 2+ on the left side.      Patellar reflexes are 2+ on the right side and 2+ on the left side.    Comments: Grip equal and strong bilateral upper extremities. Gait strong and steady. Able to perform finger-to-nose without difficulty.    Psychiatric:        Speech: Speech normal.        Behavior: Behavior normal.        Thought Content: Thought content normal.        Assessment & Plan:   Problem List Items Addressed This Visit      Cardiovascular and Mediastinum   HTN (hypertension) - Primary    Elevated today, patient is not currently on HCTZ.  Advised her to stay off of HCTZ due to hypokalemia.  She will finish potassium chloride which was given to her from Dr Derrel Nip. We will  recheck labs today.  Advised for blood pressure control to go ahead and start amlodipine.  Patient monitor blood pressure at home.  She will let me know how her blood pressure is doing      Relevant Medications   amLODipine (NORVASC) 5 MG tablet     Other   Routine physical examination   Sinus pressure    Left-sided sinus "pressure", considering atypical headache presentation.  Reassured by normal neurologic exam today.  Unchanged and triggered by cold weather.  Patient declines imaging of the brain today.  Advised her to stay consistent with the Flonase, monitor for new symptoms, if symptoms were to persist and certainly new symptoms develop, I advised like her I would like to consult  ear nose and throat and/or neurology.      Hypokalemia       I have discontinued Bethann Mcnay's hydrochlorothiazide, ondansetron, benzonatate, naproxen, guaiFENesin-codeine, and predniSONE. I am also having her start on amLODipine. Additionally, I am having her maintain her bimatoprost, fluticasone, lidocaine, diclofenac sodium, omeprazole, metoprolol tartrate, gabapentin, montelukast, mirtazapine, atorvastatin, losartan, potassium chloride, ipratropium-albuterol, and levofloxacin.   Meds ordered this encounter  Medications  . amLODipine (NORVASC) 5 MG tablet    Sig: Take 1 tablet (5 mg total) by mouth daily.    Dispense:  90 tablet    Refill:  3    Order Specific Question:   Supervising Provider    Answer:   Crecencio Mc [[id number]]    Return precautions given.   Risks, benefits, and alternatives of the medications and treatment plan prescribed today were discussed, and patient expressed understanding.   Education regarding symptom management and diagnosis given to patient on AVS.  Continue to follow with Burnard Hawthorne, FNP for routine  health maintenance.   Talmadge Coventry and I agreed with plan.   [name], FNP

## 2018-09-28 NOTE — Telephone Encounter (Signed)
Pt came in and stated, "Are you a good sticker?" Advised pt I believe that I am. Pt stated when tourniquet was tied, "you will have to tie that tighter." Tied tourniquet tighter for pt. PT stated, "you will have to use butterfly, I am a hard stick." Stuck pt in left [facility] with butterfly as pt suggested. PT jumped and butterfly needle came out of arm. Placed band aid on pt arm and asked other lab staff to look at pt to be stuck again.

## 2018-09-29 LAB — VITAMIN D 25 HYDROXY (VIT D DEFICIENCY, FRACTURES): VITD: 9.47 ng/mL — ABNORMAL LOW (ref 30.00–100.00)

## 2018-09-29 LAB — COMPREHENSIVE METABOLIC PANEL
ALK PHOS: 55 U/L (ref 39–117)
ALT: 9 U/L (ref 0–35)
AST: 13 U/L (ref 0–37)
Albumin: 3.9 g/dL (ref 3.5–5.2)
BUN: 16 mg/dL (ref 6–23)
CO2: 28 mEq/L (ref 19–32)
Calcium: 9.1 mg/dL (ref 8.4–10.5)
Chloride: 101 mEq/L (ref 96–112)
Creatinine, Ser: 0.72 mg/dL (ref 0.40–1.20)
GFR: 106.56 mL/min (ref >60.00)
Glucose, Bld: 83 mg/dL (ref 70–99)
Potassium: 3.9 mEq/L (ref 3.5–5.1)
Sodium: 137 mEq/L (ref 135–145)
TOTAL PROTEIN: 6.4 g/dL (ref 6.0–8.3)
Total Bilirubin: 0.3 mg/dL (ref 0.2–1.2)

## 2018-09-29 LAB — CBC WITH DIFFERENTIAL/PLATELET
BASOS ABS: 0.1 10*3/uL (ref 0.0–0.1)
Basophils Relative: 0.6 % (ref 0.0–3.0)
Eosinophils Absolute: 0.1 10*3/uL (ref 0.0–0.7)
Eosinophils Relative: 1.1 % (ref 0.0–5.0)
HCT: 39.1 % (ref 36.0–46.0)
Hemoglobin: 12.9 g/dL (ref 12.0–15.0)
LYMPHS ABS: 4.7 10*3/uL — AB (ref 0.7–4.0)
Lymphocytes Relative: 36.1 % (ref 12.0–46.0)
MCHC: 33.2 g/dL (ref 30.0–36.0)
MCV: 92.3 fl (ref 78.0–100.0)
Monocytes Absolute: 1.3 10*3/uL — ABNORMAL HIGH (ref 0.1–1.0)
Monocytes Relative: 10.1 % (ref 3.0–12.0)
NEUTROS PCT: 52.1 % (ref 43.0–77.0)
Neutro Abs: 6.7 10*3/uL (ref 1.4–7.7)
Platelets: 480 10*3/uL — ABNORMAL HIGH (ref 150.0–400.0)
RBC: 4.23 Mil/uL (ref 3.87–5.11)
RDW: 13.8 % (ref 11.5–15.5)
WBC: 12.9 10*3/uL — ABNORMAL HIGH (ref 4.0–10.5)

## 2018-09-29 LAB — HEMOGLOBIN A1C: Hgb A1c MFr Bld: 6.5 % (ref 4.6–6.5)

## 2018-09-29 LAB — TSH: TSH: 0.94 u[IU]/mL (ref 0.35–4.50)

## 2018-09-29 LAB — MAGNESIUM: Magnesium: 1.8 mg/dL (ref 1.5–2.5)

## 2018-09-30 ENCOUNTER — Other Ambulatory Visit: Payer: Self-pay | Admitting: Family

## 2018-09-30 DIAGNOSIS — R7989 Other specified abnormal findings of blood chemistry: Secondary | ICD-10-CM

## 2018-09-30 DIAGNOSIS — E559 Vitamin D deficiency, unspecified: Secondary | ICD-10-CM

## 2018-09-30 MED ORDER — CHOLECALCIFEROL 1.25 MG (50000 UT) PO TABS: ORAL_TABLET | ORAL | 0 refills | Status: DC

## 2018-10-02 ENCOUNTER — Emergency Department: Payer: BLUE CROSS/BLUE SHIELD

## 2018-10-02 ENCOUNTER — Other Ambulatory Visit: Payer: Self-pay

## 2018-10-02 ENCOUNTER — Encounter: Payer: Self-pay | Admitting: Emergency Medicine

## 2018-10-02 ENCOUNTER — Emergency Department
Admission: EM | Admit: 2018-10-02 | Discharge: 2018-10-02 | Disposition: A | Discharge status: Home / Self Care | Payer: BLUE CROSS/BLUE SHIELD | Attending: Student in an Organized Health Care Education/Training Program | Admitting: Student in an Organized Health Care Education/Training Program

## 2018-10-02 DIAGNOSIS — R1031 Right lower quadrant pain: Secondary | ICD-10-CM | POA: Diagnosis not present

## 2018-10-02 DIAGNOSIS — N83201 Unspecified ovarian cyst, right side: Secondary | ICD-10-CM | POA: Diagnosis not present

## 2018-10-02 DIAGNOSIS — Z87891 Personal history of nicotine dependence: Secondary | ICD-10-CM | POA: Diagnosis not present

## 2018-10-02 DIAGNOSIS — E119 Type 2 diabetes mellitus without complications: Secondary | ICD-10-CM | POA: Diagnosis not present

## 2018-10-02 DIAGNOSIS — J449 Chronic obstructive pulmonary disease, unspecified: Secondary | ICD-10-CM | POA: Diagnosis not present

## 2018-10-02 DIAGNOSIS — Z79899 Other long term (current) drug therapy: Secondary | ICD-10-CM | POA: Insufficient documentation

## 2018-10-02 DIAGNOSIS — I10 Essential (primary) hypertension: Secondary | ICD-10-CM | POA: Diagnosis not present

## 2018-10-02 HISTORY — DX: Type 2 diabetes mellitus without complications: E11.9

## 2018-10-02 LAB — LIPASE, BLOOD: Lipase: 22 U/L (ref 11–51)

## 2018-10-02 LAB — URINALYSIS, COMPLETE (UACMP) WITH MICROSCOPIC
Bacteria, UA: NONE SEEN
Bilirubin Urine: NEGATIVE
Glucose, UA: NEGATIVE mg/dL
Hgb urine dipstick: NEGATIVE
Ketones, ur: NEGATIVE mg/dL
Leukocytes, UA: NEGATIVE
Nitrite: NEGATIVE
Protein, ur: NEGATIVE mg/dL
SPECIFIC GRAVITY, URINE: 1.006 (ref 1.005–1.030)
pH: 6 (ref 5.0–8.0)

## 2018-10-02 LAB — CBC
HCT: 39.5 % (ref 36.0–46.0)
Hemoglobin: 12.8 g/dL (ref 12.0–15.0)
MCH: 30 pg (ref 26.0–34.0)
MCHC: 32.4 g/dL (ref 30.0–36.0)
MCV: 92.7 fL (ref 80.0–100.0)
Platelets: 456 10*3/uL — ABNORMAL HIGH (ref 150–400)
RBC: 4.26 MIL/uL (ref 3.87–5.11)
RDW: 13.4 % (ref 11.5–15.5)
WBC: 8.6 10*3/uL (ref 4.0–10.5)
nRBC: 0 % (ref 0.0–0.2)

## 2018-10-02 LAB — COMPREHENSIVE METABOLIC PANEL
ALT: 14 U/L (ref 0–44)
AST: 17 U/L (ref 15–41)
Albumin: 3.8 g/dL (ref 3.5–5.0)
Alkaline Phosphatase: 74 U/L (ref 38–126)
Anion gap: 7 (ref 5–15)
BUN: 11 mg/dL (ref 6–20)
CO2: 28 mmol/L (ref 22–32)
Calcium: 9.1 mg/dL (ref 8.9–10.3)
Chloride: 101 mmol/L (ref 98–111)
Creatinine, Ser: 0.72 mg/dL (ref 0.44–1.00)
GFR calc Af Amer: >60 mL/min (ref >60)
GFR calc non Af Amer: >60 mL/min (ref >60)
Glucose, Bld: 108 mg/dL — ABNORMAL HIGH (ref 70–99)
Potassium: 4.6 mmol/L (ref 3.5–5.1)
Sodium: 136 mmol/L (ref 135–145)
Total Bilirubin: 0.8 mg/dL (ref 0.3–1.2)
Total Protein: 7.3 g/dL (ref 6.5–8.1)

## 2018-10-02 MED ORDER — ONDANSETRON HCL 4 MG/2ML IJ SOLN
4.0000 mg | Freq: Once | INTRAMUSCULAR | Status: AC
  Administered 2018-10-02: 4 mg via INTRAVENOUS
  Filled 2018-10-02: qty 2

## 2018-10-02 MED ORDER — POLYETHYLENE GLYCOL 3350 17 G PO PACK: 17.0000 g | PACK | Freq: Every day | ORAL | 0 refills | Status: DC

## 2018-10-02 MED ORDER — FENTANYL CITRATE (PF) 100 MCG/2ML IJ SOLN
50.0000 ug | INTRAMUSCULAR | Status: DC | PRN
  Administered 2018-10-02: 50 ug via INTRAVENOUS
  Filled 2018-10-02: qty 2

## 2018-10-02 MED ORDER — IOPAMIDOL (ISOVUE-300) INJECTION 61%
100.0000 mL | Freq: Once | INTRAVENOUS | Status: AC | PRN
  Administered 2018-10-02: 100 mL via INTRAVENOUS

## 2018-10-02 MED ORDER — KETOROLAC TROMETHAMINE 30 MG/ML IJ SOLN
15.0000 mg | Freq: Once | INTRAMUSCULAR | Status: AC
  Administered 2018-10-02: 15 mg via INTRAVENOUS
  Filled 2018-10-02: qty 1

## 2018-10-02 MED ORDER — MORPHINE SULFATE (PF) 4 MG/ML IV SOLN: 4.0000 mg | INTRAVENOUS | Status: DC | PRN

## 2018-10-02 MED ORDER — SODIUM CHLORIDE 0.9 % IV BOLUS
500.0000 mL | Freq: Once | INTRAVENOUS | Status: AC
  Administered 2018-10-02: 500 mL via INTRAVENOUS

## 2018-10-02 MED ORDER — HYDROCODONE-ACETAMINOPHEN 5-325 MG PO TABS: 1.0000 | ORAL_TABLET | ORAL | 0 refills | Status: DC | PRN

## 2018-10-02 NOTE — ED Triage Notes (Signed)
Patient ambulatory to triage with steady gait, without difficulty or distress noted; pt reports right lower abd pain, nonradiating since Tuesday with no accomp symptoms; denies hx of same

## 2018-10-02 NOTE — Progress Notes (Signed)
Subjective:    Patient ID: Brooke Murphy, female    DOB: 10/11/1960, 58 y.o.   MRN: [id number]  CC: Brooke Murphy is a 58 y.o. female who presents today for follow up.   HPI: Concerned for low blood sugar . Describes episode 10am at table, felt nauseated, diaphoretic, blurry vision. Laid head down and drank a ginger ale and felt better. Symptoms resolved in a couple of minutes. No breakfast that day. No water that day.  Usually has coffee and nabs.  No syncope. No prior episodes.  Works as Pharmacologist.  Newly dm  Still having lower abdominal pain, had been more right sided and not more central. Pain is unchanged, feels like 'pressure.'  Complains of constipation. Last BM 2 days ago. Passing 'a lot of gas.' NO fever, vomiting. Has been using miralax daily. Had been taking norco from ED.   HTN- compliant with medications. hasnt taken medication today yet. Denies exertional chest pain or pressure, numbness or tingling radiating to left arm or jaw, palpitations, or shortness of breath.    No longer having headaches; hasnt had to use the gabapentin in a month.   Depression - stable  On remeron.       ED 10/02/2018 for abdominal pain. CT abdommen shows right adnexa mass. Atherosclerosis Pelvic US shows mixed cystic and solid right adnexal mass. New.  Consulted GYN.  Concern for neoplasm Ca 125 normal.  HISTORY:  Past Medical History:  Diagnosis Date  . Brain aneurysm 1999   [facility]  . Diabetes mellitus without complication ([place])   . Hyperlipidemia   . Hypertension   . Rheumatoid arthritis ([place]) 2002   s/p methotrexate, humira and embrel  . Ulcer of abdomen wall [facility])    Past Surgical History:  Procedure Laterality Date  . ABDOMINAL HYSTERECTOMY  2005   ovaries remain; WITH CERVIX  . BRAIN SURGERY Left 11/27/1997   aneurysm   Family History  Problem Relation Age of Onset  . Hypertension Mother   . Aneurysm Mother 47       died  . Hyperlipidemia Sister   . Hypertension  Sister   . Cancer Father 70       throat cancer  . Diabetes Maternal Aunt   . Breast cancer Maternal Aunt 65  . Hypertension Maternal Grandmother   . Stroke Maternal Grandmother   . Colon cancer Neg Hx     Allergies: Penicillins; Celebrex [celecoxib]; and Lodine [etodolac] Current Outpatient Medications on File Prior to Visit  Medication Sig Dispense Refill  . amLODipine (NORVASC) 5 MG tablet Take 1 tablet (5 mg total) by mouth daily. 90 tablet 3  . atorvastatin (LIPITOR) 20 MG tablet Take 1 tablet (20 mg total) by mouth daily. 90 tablet 2  . bimatoprost (LUMIGAN) 0.01 % SOLN Place 1 drop into both eyes at bedtime.    . Cholecalciferol 1.25 MG (50000 UT) TABS 50,000 units PO qwk for 8 weeks. 8 tablet 0  . diclofenac sodium (VOLTAREN) 1 % GEL Apply 4 g topically 4 (four) times daily. 1 Tube 3  . fluticasone (FLONASE) 50 MCG/ACT nasal spray Place 2 sprays into both nostrils daily. (Patient taking differently: Place 2 sprays into both nostrils daily as needed for allergies. ) 16 g 6  . gabapentin (NEURONTIN) 100 MG capsule TAKE 1 CAPSULE (100 MG TOTAL) BY MOUTH 3 (THREE) TIMES DAILY AS NEEDED (FOR HEADACHE/NERVE PAIN). 90 capsule 0  . HYDROcodone-acetaminophen (NORCO) 5-325 MG tablet Take 1 tablet by mouth every 4 (  four) hours as needed for up to 12 doses for moderate pain. 12 tablet 0  . ipratropium-albuterol (DUONEB) 0.5-2.5 (3) MG/3ML SOLN Take 3 mLs by nebulization every 4 (four) hours as needed. 360 mL 1  . levofloxacin (LEVAQUIN) 500 MG tablet Take 1 tablet (500 mg total) by mouth daily. 7 tablet 0  . lidocaine (LIDODERM) 5 % Place 1 patch onto the skin daily. Remove & Discard patch within 12 hours. 30 patch 0  . losartan (COZAAR) 100 MG tablet Take 1 tablet (100 mg total) by mouth daily. 90 tablet 3  . metoprolol tartrate (LOPRESSOR) 25 MG tablet TAKE 1 TABLET (25 MG TOTAL) BY MOUTH 2 (TWO) TIMES DAILY. 180 tablet 0  . mirtazapine (REMERON) 30 MG tablet Take 1 tablet (30 mg total) by  mouth at bedtime. 90 tablet 1  . montelukast (SINGULAIR) 10 MG tablet TAKE 1 TABLET BY MOUTH EVERYDAY AT BEDTIME 30 tablet 3  . omeprazole (PRILOSEC) 20 MG capsule Take 1 capsule (20 mg total) by mouth daily. 30 capsule 3  . polyethylene glycol (MIRALAX / GLYCOLAX) packet Take 17 g by mouth daily. Mix one tablespoon with 8oz of your favorite juice or water every day until you are having soft formed stools. Then start taking once daily if you didn't have a stool the day before. 30 each 0   No current facility-administered medications on file prior to visit.     Social History   Tobacco Use  . Smoking status: Former Smoker    Packs/day: 0.90    Years: 37.00    Pack years: 33.30    Last attempt to quit: 09/03/2018    Years since quitting: 0.0  . Smokeless tobacco: Never Used  Substance Use Topics  . Alcohol use: No    Alcohol/week: 0.0 standard drinks  . Drug use: No    Review of Systems  Constitutional: Negative for chills and fever.  Respiratory: Negative for cough.   Cardiovascular: Negative for chest pain and palpitations.  Gastrointestinal: Positive for abdominal distention, abdominal pain and constipation. Negative for blood in stool, nausea and vomiting.  Genitourinary: Negative for dysuria.  Neurological: Positive for dizziness. Negative for syncope and headaches.      Objective:    BP 132/80 (BP Location: Left Arm, Patient Position: Sitting, Cuff Size: Large)   Pulse (!) 102   Temp 98.8 F (37.1 C)   Wt 142 lb 12.8 oz (64.8 kg)   SpO2 97%   BMI 23.76 kg/m  BP Readings from Last 3 Encounters:  10/05/18 132/80  10/02/18 134/86  09/28/18 140/84   Wt Readings from Last 3 Encounters:  10/05/18 142 lb 12.8 oz (64.8 kg)  10/02/18 147 lb (66.7 kg)  09/28/18 147 lb 6.4 oz (66.9 kg)    Physical Exam Vitals signs reviewed.  Constitutional:      Appearance: Normal appearance. She is well-developed.  HENT:     Mouth/Throat:     Pharynx: Uvula midline.  Eyes:      Conjunctiva/sclera: Conjunctivae normal.     Pupils: Pupils are equal, round, and reactive to light.     Comments: Fundus normal bilaterally.   Cardiovascular:     Rate and Rhythm: Normal rate and regular rhythm.     Pulses: Normal pulses.     Heart sounds: Normal heart sounds.  Pulmonary:     Effort: Pulmonary effort is normal.     Breath sounds: Normal breath sounds. No wheezing, rhonchi or rales.  Abdominal:  General: Bowel sounds are normal. There is distension.     Palpations: Abdomen is soft. Abdomen is not rigid. There is no fluid wave or mass.     Tenderness: There is abdominal tenderness in the right lower quadrant. There is no guarding or rebound.  Skin:    General: Skin is warm and dry.  Neurological:     Mental Status: She is alert.     Cranial Nerves: No cranial nerve deficit.     Sensory: No sensory deficit.     Deep Tendon Reflexes:     Reflex Scores:      Bicep reflexes are 2+ on the right side and 2+ on the left side.      Patellar reflexes are 2+ on the right side and 2+ on the left side.    Comments: Grip equal and strong bilateral upper extremities. Gait strong and steady. Able to perform rapid alternating movement without difficulty.   Psychiatric:        Speech: Speech normal.        Behavior: Behavior normal.        Thought Content: Thought content normal.        Assessment & Plan:   Problem List Items Addressed This Visit      Endocrine   Diabetes mellitus without complication ([place])    Symptoms consistent with hypoglycemic episode.  No recurrent episodes.  Normal neurologic exam today.  Referral placed nutrition for diabetic management.  Also emphasized the importance of patient eating regular meals, not skipping breakfast.  She needs to stay adequately hydrated.  Have given her One Touch glucometer, and education as to how to test blood sugar.  She will let me know of any recurrent episode      Relevant Orders   Referral to Nutrition and Diabetes  Services     Other   Adnexal mass - Primary    Reviewed CT, ultrasound done in emergency room.  Normal Ca1 25.  Concern for neoplasm and want to ensure that this is fully evaluated . urgent referral placed to GYN.  Patient will contact this week if she has not heard from [place] this week      Relevant Orders   Ambulatory referral to Obstetrics / Gynecology   Constipation    Distention on exam, patient is nontoxic in appearance.  She politely declines plain film abdominal x-ray to evaluate for ileus.  She is passing gas.  Emphasized the importance that if she does not have a bowel movement today, she will let me know.  Advised her to take MiraLAX twice a day and go ahead and take with the Bisodyl suppository.  She will do this when she gets home.  She will let me know if not better today.          I am having Montine Hight maintain her bimatoprost, fluticasone, lidocaine, diclofenac sodium, omeprazole, metoprolol tartrate, gabapentin, montelukast, mirtazapine, atorvastatin, losartan, ipratropium-albuterol, levofloxacin, amLODipine, Cholecalciferol, HYDROcodone-acetaminophen, and polyethylene glycol.   No orders of the defined types were placed in this encounter.   Return precautions given.   Risks, benefits, and alternatives of the medications and treatment plan prescribed today were discussed, and patient expressed understanding.   Education regarding symptom management and diagnosis given to patient on AVS.  Continue to follow with Burnard Hawthorne, FNP for routine health maintenance.   Talmadge Coventry and I agreed with plan.   [name], FNP

## 2018-10-02 NOTE — Discharge Instructions (Signed)

## 2018-10-02 NOTE — ED Provider Notes (Signed)
[facility] Emergency Department Provider Note    First MD Initiated Contact with Patient 10/02/18 [phone]     (approximate)  I have reviewed the triage vital signs and the nursing notes.   HISTORY  Chief Complaint Abdominal Pain    HPI Brooke Murphy is a 58 y.o. female below listed past medical history presents the ER with 4 days of progressively worsening right lower quadrant abdominal pain.  Denies any fevers nausea vomiting or diarrhea.  Is never had pain like this before.  Denies any previous abdominal surgeries.   Not take anything for the pain.  Describes the pain is mild to moderate.  Has any dysuria.  No vaginal discharge.   Past Medical History:  Diagnosis Date  . Brain aneurysm 1999   [facility]  . Diabetes mellitus without complication ([place])   . Hyperlipidemia   . Hypertension   . Rheumatoid arthritis ([place]) 2002   s/p methotrexate, humira and embrel  . Ulcer of abdomen wall ([facility])    Family History  Problem Relation Age of Onset  . Hypertension Mother   . Aneurysm Mother 105       died  . Hyperlipidemia Sister   . Hypertension Sister   . Cancer Father 70       throat cancer  . Diabetes Maternal Aunt   . Breast cancer Maternal Aunt 65  . Hypertension Maternal Grandmother   . Stroke Maternal Grandmother   . Colon cancer Neg Hx    Past Surgical History:  Procedure Laterality Date  . ABDOMINAL HYSTERECTOMY  2005   ovaries remain; WITH CERVIX  . BRAIN SURGERY Left 11/27/1997   aneurysm   Patient Active Problem List   Diagnosis Date Noted  . Hypokalemia 09/23/2018  . COPD with acute exacerbation ([place]) 09/23/2018  . Aortic atherosclerosis ([place]) 09/09/2018  . Smoker 07/20/2018  . Goiter 07/20/2018  . Right arm pain 07/20/2018  . Encounter for screening for malignant neoplasm of respiratory organs 07/20/2018  . Headache 11/17/2017  . Sinus pressure 11/17/2017  . Hyperthyroidism 04/29/2017  . Routine physical examination 02/12/2017  .  GERD (gastroesophageal reflux disease) 08/27/2016  . HLD (hyperlipidemia) 08/27/2016  . Depression 08/27/2016  . Right shoulder pain 06/15/2015  . Arthritis 04/08/2015  . HTN (hypertension) 12/10/2014  . Allergic rhinitis 12/10/2014  . Thoracic back pain 12/10/2014      Prior to Admission medications   Medication Sig Start Date End Date Taking? Authorizing Provider  amLODipine (NORVASC) 5 MG tablet Take 1 tablet (5 mg total) by mouth daily. 09/28/18   Burnard Hawthorne, FNP  atorvastatin (LIPITOR) 20 MG tablet Take 1 tablet (20 mg total) by mouth daily. 09/11/18   Burnard Hawthorne, FNP  bimatoprost (LUMIGAN) 0.01 % SOLN Place 1 drop into both eyes at bedtime.    [provider]  Cholecalciferol 1.25 MG (50000 UT) TABS 50,000 units PO qwk for 8 weeks. 09/30/18   Burnard Hawthorne, FNP  diclofenac sodium (VOLTAREN) 1 % GEL Apply 4 g topically 4 (four) times daily. 07/20/18   Burnard Hawthorne, FNP  fluticasone (FLONASE) 50 MCG/ACT nasal spray Place 2 sprays into both nostrils daily. Patient taking differently: Place 2 sprays into both nostrils daily as needed for allergies.  04/26/14   Rey, Latina Craver, NP  gabapentin (NEURONTIN) 100 MG capsule TAKE 1 CAPSULE (100 MG TOTAL) BY MOUTH 3 (THREE) TIMES DAILY AS NEEDED (FOR HEADACHE/NERVE PAIN). 08/12/18   Burnard Hawthorne, FNP  HYDROcodone-acetaminophen [facility]) [phone]  MG tablet Take 1 tablet by mouth every 4 (four) hours as needed for up to 12 doses for moderate pain. 10/02/18   Merlyn Lot, MD  ipratropium-albuterol (DUONEB) 0.5-2.5 (3) MG/3ML SOLN Take 3 mLs by nebulization every 4 (four) hours as needed. 09/20/18   Fisher, Linden Dolin, PA-C  levofloxacin (LEVAQUIN) 500 MG tablet Take 1 tablet (500 mg total) by mouth daily. 09/21/18   Crecencio Mc, MD  lidocaine (LIDODERM) 5 % Place 1 patch onto the skin daily. Remove & Discard patch within 12 hours. 07/20/18   Burnard Hawthorne, FNP  losartan (COZAAR) 100 MG tablet Take 1 tablet  (100 mg total) by mouth daily. 09/11/18   Burnard Hawthorne, FNP  metoprolol tartrate (LOPRESSOR) 25 MG tablet TAKE 1 TABLET (25 MG TOTAL) BY MOUTH 2 (TWO) TIMES DAILY. 08/07/18   Burnard Hawthorne, FNP  mirtazapine (REMERON) 30 MG tablet Take 1 tablet (30 mg total) by mouth at bedtime. 09/11/18   Burnard Hawthorne, FNP  montelukast (SINGULAIR) 10 MG tablet TAKE 1 TABLET BY MOUTH EVERYDAY AT BEDTIME 09/11/18   Burnard Hawthorne, FNP  omeprazole (PRILOSEC) 20 MG capsule Take 1 capsule (20 mg total) by mouth daily. 08/07/18   Burnard Hawthorne, FNP  polyethylene glycol (MIRALAX / GLYCOLAX) packet Take 17 g by mouth daily. Mix one tablespoon with 8oz of your favorite juice or water every day until you are having soft formed stools. Then start taking once daily if you didn't have a stool the day before. 10/02/18   Merlyn Lot, MD    Allergies Penicillins; Celebrex [celecoxib]; and Lodine [etodolac]    Social History Social History   Tobacco Use  . Smoking status: Former Smoker    Packs/day: 0.90    Years: 37.00    Pack years: 33.30    Last attempt to quit: 09/03/2018    Years since quitting: 0.0  . Smokeless tobacco: Never Used  Substance Use Topics  . Alcohol use: No    Alcohol/week: 0.0 standard drinks  . Drug use: No    Review of Systems Patient denies headaches, rhinorrhea, blurry vision, numbness, shortness of breath, chest pain, edema, cough, abdominal pain, nausea, vomiting, diarrhea, dysuria, fevers, rashes or hallucinations unless otherwise stated above in HPI. ____________________________________________   PHYSICAL EXAM:  VITAL SIGNS: Vitals:   10/02/18 0653 10/02/18 1017  BP: (!) 175/92 (!) 171/103  Pulse: 98 61  Resp: 18 18  Temp: 98.1 F (36.7 C)   SpO2: 97% 96%    Constitutional: Alert and oriented.  Eyes: Conjunctivae are normal.  Head: Atraumatic. Nose: No congestion/rhinnorhea. Mouth/Throat: Mucous membranes are moist.   Neck: No stridor.  Painless ROM.  Cardiovascular: Normal rate, regular rhythm. Grossly normal heart sounds.  Good peripheral circulation. Respiratory: Normal respiratory effort.  No retractions. Lungs CTAB. Gastrointestinal: Soft with ttp in RLQ. No distention. No abdominal bruits. No CVA tenderness. Genitourinary: deferred Musculoskeletal: No lower extremity tenderness nor edema.  No joint effusions. Neurologic:  Normal speech and language. No gross focal neurologic deficits are appreciated. No facial droop Skin:  Skin is warm, dry and intact. No rash noted. Psychiatric: Mood and affect are normal. Speech and behavior are normal.  ____________________________________________   LABS (all labs ordered are listed, but only abnormal results are displayed)  Results for orders placed or performed during the hospital encounter of 10/02/18 (from the past 24 hour(s))  Lipase, blood     Status: None   Collection Time: 10/02/18  7:48 AM  Result Value Ref Range   Lipase 22 11 - 51 U/L  Comprehensive metabolic panel     Status: Abnormal   Collection Time: 10/02/18  7:48 AM  Result Value Ref Range   Sodium 136 135 - 145 mmol/L   Potassium 4.6 3.5 - 5.1 mmol/L   Chloride 101 98 - 111 mmol/L   CO2 28 22 - 32 mmol/L   Glucose, Bld 108 (H) 70 - 99 mg/dL   BUN 11 6 - 20 mg/dL   Creatinine, Ser 0.72 0.44 - 1.00 mg/dL   Calcium 9.1 8.9 - 10.3 mg/dL   Total Protein 7.3 6.5 - 8.1 g/dL   Albumin 3.8 3.5 - 5.0 g/dL   AST 17 15 - 41 U/L   ALT 14 0 - 44 U/L   Alkaline Phosphatase 74 38 - 126 U/L   Total Bilirubin 0.8 0.3 - 1.2 mg/dL   GFR calc non Af Amer >60 >60 mL/min   GFR calc Af Amer >60 >60 mL/min   Anion gap 7 5 - 15  CBC     Status: Abnormal   Collection Time: 10/02/18  7:48 AM  Result Value Ref Range   WBC 8.6 4.0 - 10.5 K/uL   RBC 4.26 3.87 - 5.11 MIL/uL   Hemoglobin 12.8 12.0 - 15.0 g/dL   HCT 39.5 36.0 - 46.0 %   MCV 92.7 80.0 - 100.0 fL   MCH 30.0 26.0 - 34.0 pg   MCHC 32.4 30.0 - 36.0 g/dL   RDW  13.4 11.5 - 15.5 %   Platelets 456 (H) 150 - 400 K/uL   nRBC 0.0 0.0 - 0.2 %  Urinalysis, Complete w Microscopic     Status: Abnormal   Collection Time: 10/02/18  8:44 AM  Result Value Ref Range   Color, Urine STRAW (A) YELLOW   APPearance HAZY (A) CLEAR   Specific Gravity, Urine 1.006 1.005 - 1.030   pH 6.0 5.0 - 8.0   Glucose, UA NEGATIVE NEGATIVE mg/dL   Hgb urine dipstick NEGATIVE NEGATIVE   Bilirubin Urine NEGATIVE NEGATIVE   Ketones, ur NEGATIVE NEGATIVE mg/dL   Protein, ur NEGATIVE NEGATIVE mg/dL   Nitrite NEGATIVE NEGATIVE   Leukocytes, UA NEGATIVE NEGATIVE   WBC, UA 0-5 0 - 5 WBC/hpf   Bacteria, UA NONE SEEN NONE SEEN   Squamous Epithelial / LPF 0-5 0 - 5   ____________________________________________ ____________________________________________  RADIOLOGY  I personally reviewed all radiographic images ordered to evaluate for the above acute complaints and reviewed radiology reports and findings.  These findings were personally discussed with the patient.  Please see medical record for radiology report.  ____________________________________________   PROCEDURES  Procedure(s) performed:  Procedures    Critical Care performed: no ____________________________________________   INITIAL IMPRESSION / ASSESSMENT AND PLAN / ED COURSE  Pertinent labs & imaging results that were available during my care of the patient were reviewed by me and considered in my medical decision making (see chart for details).   DDX: appy, uti,stone, colitis, diverticulitis, mass, torsion, toa  Brooke Murphy is a 58 y.o. who presents to the ED with symptoms as described above.  Patient afebrile and nontoxic-appearing but is quite tender on exam.  Blood work is reassuring but CT imaging will be ordered to evaluate for the above differential.  The patient will be placed on continuous pulse oximetry and telemetry for monitoring.  Laboratory evaluation will be sent to evaluate for the above  complaints.     Clinical Course as  of Oct 02 1225  Fri Oct 02, 2018  1021 No evidence of appendicitis but does have large complex cystic mass arising from the right adnexa and ovary concerning for neoplasm.  Will order ultrasound to further characterize.  Patient still having some discomfort therefore will order additional dose of IV pain medication.   [PR]  1224 Discussed case in consultation with Dr. Ouida Sills of OB/GYN.  He has requested additional Ca1 25 left be sent.  She is otherwise pain-free and in no acute distress.  Do believe she stable and appropriate for outpatient follow-up.  Have discussed with the patient and available family all diagnostics and treatments performed thus far and all questions were answered to the best of my ability. The patient demonstrates understanding and agreement with plan.    [PR]    Clinical Course User Index [PR] Merlyn Lot, MD     As part of my medical decision making, I reviewed the following data within the [facility] notes reviewed and incorporated, Labs reviewed, notes from prior ED visits   ____________________________________________   FINAL CLINICAL IMPRESSION(S) / ED DIAGNOSES  Final diagnoses:  Abdominal pain, RLQ  Cyst of right ovary      NEW MEDICATIONS STARTED DURING THIS VISIT:  New Prescriptions   HYDROCODONE-ACETAMINOPHEN (NORCO) 5-325 MG TABLET    Take 1 tablet by mouth every 4 (four) hours as needed for up to 12 doses for moderate pain.   POLYETHYLENE GLYCOL (MIRALAX / GLYCOLAX) PACKET    Take 17 g by mouth daily. Mix one tablespoon with 8oz of your favorite juice or water every day until you are having soft formed stools. Then start taking once daily if you didn't have a stool the day before.     Note:  This document was prepared using Dragon voice recognition software and may include unintentional dictation errors.    Merlyn Lot, MD 10/02/18 1227

## 2018-10-03 LAB — CA 125: Cancer Antigen (CA) 125: 9.8 U/mL (ref 0.0–38.1)

## 2018-10-05 ENCOUNTER — Encounter: Payer: Self-pay | Admitting: Family

## 2018-10-05 ENCOUNTER — Other Ambulatory Visit: Payer: Self-pay

## 2018-10-05 ENCOUNTER — Ambulatory Visit (INDEPENDENT_AMBULATORY_CARE_PROVIDER_SITE_OTHER): Payer: BLUE CROSS/BLUE SHIELD | Admitting: Family

## 2018-10-05 VITALS — BP 132/80 | HR 102 | Temp 98.8°F | Wt 142.8 lb

## 2018-10-05 DIAGNOSIS — N9489 Other specified conditions associated with female genital organs and menstrual cycle: Secondary | ICD-10-CM | POA: Diagnosis not present

## 2018-10-05 DIAGNOSIS — K5900 Constipation, unspecified: Secondary | ICD-10-CM

## 2018-10-05 DIAGNOSIS — R7303 Prediabetes: Secondary | ICD-10-CM | POA: Insufficient documentation

## 2018-10-05 DIAGNOSIS — E119 Type 2 diabetes mellitus without complications: Secondary | ICD-10-CM

## 2018-10-05 MED ORDER — ONETOUCH ULTRASOFT LANCETS MISC: 3 refills | Status: DC

## 2018-10-05 MED ORDER — GLUCOSE BLOOD VI STRP: ORAL_STRIP | 3 refills | Status: DC

## 2018-10-05 NOTE — Patient Instructions (Addendum)
Call me by the end of the week if you havent heard from [place] in regards to GYN appointment for right mass.   VERY important.   Ensure breakfast, plenty of water. Ensure a snack in between meals.   May check blood sugar IF ever have episode have what we think is a low blood sugar.   Monitor blood pressure,  Goal is less than 120/80, based on newest guidelines; if persistently higher, please make sooner follow up appointment so we can recheck you blood pressure and manage medications  Let me know if you do not have a bowel movement today or tomorrrow.  Constipation plan  1) Take Miralax once to twice per day until you have a bowel movement. You will end up titrating the use of Miralax. For example, you  may find that using the medication every other day or three times a week is a good bowel regimen for you. Or perhaps, twice weekly.   2)  If you do not get results with Miralax alone, you may then add Bisacodyl suppository daily to regimen until you get desired bowel results.  3) Take Colace ( stool softener) twice daily every day.   It is MOST important to drink LOTS of water and follow a HIGH fiber diet to keep foods moving through the gut. You may add Metamucil to a beverage that you drink.  Information on prevention of constipation as well as acute treatment for constipation as included below.  If there is no improvement in your symptoms, or if there is any worsening of symptoms, or if you have any additional concerns, please return to this clinic for re-evaluation; or, if we are closed, consider going to the Emergency Room for evaluation.    Constipation Prevention What is Constipation? Constipation is hard, dry bowel movements or the inability to have a bowel movement.  You can also feel like you need to have a bowel movement but not be able to.  It can also be painful when you strain to have a bowel movement.  Taking narcotic pain medicine after surgery can make you constipated, even  if you have never had a problem with constipation. What Do I Need To Do? The best thing to do for constipation is to keep it from happening.  This can be done by: Adding laxatives to your daily routine, when taking prescription pain medicines after surgery. Add 17 gm Miralax daily or 100 mg Colace once or twice daily. (Miralax is mixed in water. Colace is a pill). They soften your bowel movements to make them easier to pass and hurt less. Drink plenty of water to help flush your bowels.  (Eight, 8 ounce glasses daily) Eat foods high in fiber such as whole grains, vegetables, cereals, fruits, and prune juice (5-7 servings a day or 25 grams).  If you do not know how much fiber a food has in it you can look on the label under "dietary fiber."  If you have trouble getting enough fiber in your diet you may want to consider a fiber supplement such as Metamucil or Citrucel.  Also, be aware that eating fiber without drinking enough water can make constipation worse. If you do become constipated some medications that may help are: Bisacodyl (Dulcolax) is available in tablet form or a suppository. Glycerin suppositories are also a good choice if you need a fast acting medication. Everybody is different and may have different results.  Talk to your pharmacist or health care provider about your specific  problems. They can help you choose the best product for you.  Why Is It Important for Me To Do This? Being constipated is not something you have to live with.  There are many things you can do to help.   Feeling bad can interfere with your recovery after surgery.  If constipation goes on for too long it can become a very serious medical problem. You may need to visit your doctor or go to the hospital.  That is why it is very important to drink lots of water, eat enough fiber, and keep it from happening.  Ask Questions We want to answer all of your questions and concerns.  That's why we encourage you to use a  program called Ask Me 3T, created by the [employer].  By using Ask Me 3T you are encouraged to ask 3 simple (yet, potentially life saving questions) whenever you are talking with your physician, nurse or pharmacist: What is my main problem? What do I need to do? Why is it important for me to do this? By understanding the answer to these three questions and any other questions you may have, you have the knowledge necessary to manage your health. Please feel very comfortable asking any questions. Healthcare is complicated, so if you hear an answer you do not understand, please ask your health care team to explain again.   Sources: [employer] 07-12-10 N

## 2018-10-05 NOTE — Assessment & Plan Note (Signed)
Distention on exam, patient is nontoxic in appearance.  She politely declines plain film abdominal x-ray to evaluate for ileus.  She is passing gas.  Emphasized the importance that if she does not have a bowel movement today, she will let me know.  Advised her to take MiraLAX twice a day and go ahead and take with the Bisodyl suppository.  She will do this when she gets home.  She will let me know if not better today.

## 2018-10-05 NOTE — Assessment & Plan Note (Addendum)
Symptoms consistent with hypoglycemic episode.  No recurrent episodes.  Normal neurologic exam today.  Referral placed nutrition for diabetic management.  Also emphasized the importance of patient eating regular meals, not skipping breakfast.  She needs to stay adequately hydrated.  Have given her One Touch glucometer, and education as to how to test blood sugar.  She will let me know of any recurrent episode

## 2018-10-05 NOTE — Assessment & Plan Note (Signed)
Reviewed CT, ultrasound done in emergency room.  Normal Ca1 25.  Concern for neoplasm and want to ensure that this is fully evaluated . urgent referral placed to GYN.  Patient will contact this week if she has not heard from [place] this week

## 2018-10-08 ENCOUNTER — Ambulatory Visit: Payer: Self-pay

## 2018-10-08 NOTE — Telephone Encounter (Signed)
Patient called in with c/o "urinary symptoms." She says "I was having constipation problems and now I feel like I have a UTI. I have pain with urination, pressure to my lower abdomen, taking a long time to urinate. The pain is a 8. I have back pain as well." According to protocol, see PCP within 24 hours, appointment scheduled for tomorrow at 0800 with [name], FNP, care advice given, patient verbalized understanding.   Reason for Disposition . Side (flank) or lower back pain present  Answer Assessment - Initial Assessment Questions 1. SYMPTOM: "What's the main symptom you're concerned about?" (e.g., frequency, incontinence)     Pressure after urination 2. ONSET: "When did the symptoms start?"     Monday 3. PAIN: "Is there any pain?" If so, ask: "How bad is it?" (Scale: 1-10; mild, moderate, severe)     Yes to lower abdomen when have to urinate, 8 4. CAUSE: "What do you think is causing the symptoms?"     UTI 5. OTHER SYMPTOMS: "Do you have any other symptoms?" (e.g., fever, flank pain, blood in urine, pain with urination)     Back pain, constipation, pain with urination 6. PREGNANCY: "Is there any chance you are pregnant?" "When was your last menstrual period?"     No  Protocols used: URINARY [facility]

## 2018-10-08 NOTE — Progress Notes (Signed)
LMTCB to see how patient is doing. 

## 2018-10-09 ENCOUNTER — Encounter: Payer: Self-pay | Admitting: Family

## 2018-10-09 ENCOUNTER — Ambulatory Visit: Payer: BLUE CROSS/BLUE SHIELD | Admitting: Family

## 2018-10-09 VITALS — BP 118/62 | HR 77 | Temp 98.0°F | Wt 141.2 lb

## 2018-10-09 DIAGNOSIS — R102 Pelvic and perineal pain: Secondary | ICD-10-CM | POA: Diagnosis not present

## 2018-10-09 DIAGNOSIS — R300 Dysuria: Secondary | ICD-10-CM | POA: Diagnosis not present

## 2018-10-09 LAB — POCT URINALYSIS DIPSTICK
Glucose, UA: NEGATIVE
Ketones, UA: NEGATIVE
LEUKOCYTES UA: NEGATIVE
Nitrite, UA: NEGATIVE
Protein, UA: POSITIVE — AB
Spec Grav, UA: >=1.03 — AB (ref 1.010–1.025)
Urobilinogen, UA: 1 E.U./dL
pH, UA: 5.5 (ref 5.0–8.0)

## 2018-10-09 MED ORDER — PHENAZOPYRIDINE HCL 100 MG PO TABS: 100.0000 mg | ORAL_TABLET | Freq: Three times a day (TID) | ORAL | 2 refills | Status: AC | PRN

## 2018-10-09 NOTE — Patient Instructions (Signed)
I suspect  suprapubic pressure is  related to  right-sided cyst, you will be seen by GYN for this next week.  It may also be related to a urinary tract infection, will await urine culture to see what it shows.  In the interim, you may start Pyridium, plenty plenty of water. Over the weekend if symptoms worsen, please go to your nearest emergency room or urgent care.

## 2018-10-09 NOTE — Progress Notes (Signed)
Patient came in today for appointment,

## 2018-10-09 NOTE — Progress Notes (Signed)
Subjective:    Patient ID: Brooke Murphy, female    DOB: 07/10/1961, 58 y.o.   MRN: [id number]  CC: Brooke Murphy is a 58 y.o. female who presents today for an acute visit.    HPI: CC: suprapubic pressure x 5 days, waxing and waned. w Comfortable when sitting however if standing up, feels suprapubic pressure. No severe .  Pain has improved with using tylenol.  When tries to urinates, describes hesitancy.  Constipation has resolved. Regular formed BM this morning.  No fever, chills,  vomiting. Some nausea yesterday around same time as drank metamucil with water, resolved. Bloating resolved since  Also complains of 'soreness' of center low back, improving.  She thinks is from intensely 'rubbing her low back' while she was constipated earlier this week.  No rash.  No weakness in legs, numbness.   GYN scheduled in 5 days.    HISTORY:  Past Medical History:  Diagnosis Date  . Brain aneurysm 1999   [facility]  . Diabetes mellitus without complication ([place])   . Hyperlipidemia   . Hypertension   . Rheumatoid arthritis ([place]) 2002   s/p methotrexate, humira and embrel  . Ulcer of abdomen wall [facility])    Past Surgical History:  Procedure Laterality Date  . ABDOMINAL HYSTERECTOMY  2005   ovaries remain; WITH CERVIX  . BRAIN SURGERY Left 11/27/1997   aneurysm   Family History  Problem Relation Age of Onset  . Hypertension Mother   . Aneurysm Mother 77       died  . Hyperlipidemia Sister   . Hypertension Sister   . Cancer Father 70       throat cancer  . Diabetes Maternal Aunt   . Breast cancer Maternal Aunt 65  . Hypertension Maternal Grandmother   . Stroke Maternal Grandmother   . Colon cancer Neg Hx     Allergies: Penicillins; Celebrex [celecoxib]; and Lodine [etodolac] Current Outpatient Medications on File Prior to Visit  Medication Sig Dispense Refill  . amLODipine (NORVASC) 5 MG tablet Take 1 tablet (5 mg total) by mouth daily. 90 tablet 3  . atorvastatin (LIPITOR) 20 MG tablet  Take 1 tablet (20 mg total) by mouth daily. 90 tablet 2  . bimatoprost (LUMIGAN) 0.01 % SOLN Place 1 drop into both eyes at bedtime.    . Cholecalciferol 1.25 MG (50000 UT) TABS 50,000 units PO qwk for 8 weeks. 8 tablet 0  . diclofenac sodium (VOLTAREN) 1 % GEL Apply 4 g topically 4 (four) times daily. 1 Tube 3  . fluticasone (FLONASE) 50 MCG/ACT nasal spray Place 2 sprays into both nostrils daily. (Patient taking differently: Place 2 sprays into both nostrils daily as needed for allergies. ) 16 g 6  . gabapentin (NEURONTIN) 100 MG capsule TAKE 1 CAPSULE (100 MG TOTAL) BY MOUTH 3 (THREE) TIMES DAILY AS NEEDED (FOR HEADACHE/NERVE PAIN). 90 capsule 0  . glucose blood (ONETOUCH VERIO) test strip Used to check blood sugar once daily. 50 each 3  . HYDROcodone-acetaminophen (NORCO) 5-325 MG tablet Take 1 tablet by mouth every 4 (four) hours as needed for up to 12 doses for moderate pain. 12 tablet 0  . ipratropium-albuterol (DUONEB) 0.5-2.5 (3) MG/3ML SOLN Take 3 mLs by nebulization every 4 (four) hours as needed. 360 mL 1  . Lancets (ONETOUCH ULTRASOFT) lancets Use as instructed 100 each 3  . levofloxacin (LEVAQUIN) 500 MG tablet Take 1 tablet (500 mg total) by mouth daily. 7 tablet 0  . lidocaine (LIDODERM) 5 %  Place 1 patch onto the skin daily. Remove & Discard patch within 12 hours. 30 patch 0  . losartan (COZAAR) 100 MG tablet Take 1 tablet (100 mg total) by mouth daily. 90 tablet 3  . metoprolol tartrate (LOPRESSOR) 25 MG tablet TAKE 1 TABLET (25 MG TOTAL) BY MOUTH 2 (TWO) TIMES DAILY. 180 tablet 0  . mirtazapine (REMERON) 30 MG tablet Take 1 tablet (30 mg total) by mouth at bedtime. 90 tablet 1  . montelukast (SINGULAIR) 10 MG tablet TAKE 1 TABLET BY MOUTH EVERYDAY AT BEDTIME 30 tablet 3  . omeprazole (PRILOSEC) 20 MG capsule Take 1 capsule (20 mg total) by mouth daily. 30 capsule 3  . polyethylene glycol (MIRALAX / GLYCOLAX) packet Take 17 g by mouth daily. Mix one tablespoon with 8oz of your  favorite juice or water every day until you are having soft formed stools. Then start taking once daily if you didn't have a stool the day before. 30 each 0   No current facility-administered medications on file prior to visit.     Social History   Tobacco Use  . Smoking status: Former Smoker    Packs/day: 0.90    Years: 37.00    Pack years: 33.30    Last attempt to quit: 09/03/2018    Years since quitting: 0.0  . Smokeless tobacco: Never Used  Substance Use Topics  . Alcohol use: No    Alcohol/week: 0.0 standard drinks  . Drug use: No    Review of Systems  Constitutional: Negative for chills and fever.  Respiratory: Negative for cough.   Cardiovascular: Negative for chest pain and palpitations.  Gastrointestinal: Positive for abdominal pain and nausea. Negative for abdominal distention (resolved), blood in stool, constipation, diarrhea and vomiting.  Musculoskeletal: Positive for back pain (improving).      Objective:    BP 118/62 (BP Location: Left Arm, Patient Position: Sitting, Cuff Size: Normal)   Pulse 77   Temp 98 F (36.7 C)   Wt 141 lb 3.2 oz (64 kg)   SpO2 98%   BMI 23.50 kg/m    Physical Exam Vitals signs reviewed.  Constitutional:      Appearance: Normal appearance. She is well-developed.  Eyes:     Conjunctiva/sclera: Conjunctivae normal.  Cardiovascular:     Rate and Rhythm: Normal rate and regular rhythm.     Pulses: Normal pulses.     Heart sounds: Normal heart sounds.  Pulmonary:     Effort: Pulmonary effort is normal.     Breath sounds: Normal breath sounds. No wheezing, rhonchi or rales.  Abdominal:     General: Bowel sounds are normal. There is no distension.     Palpations: Abdomen is soft. Abdomen is not rigid. There is no fluid wave or mass.     Tenderness: There is abdominal tenderness in the suprapubic area. There is no right CVA tenderness, left CVA tenderness, guarding or rebound.     Comments: Discomfort by patient noted with  palpation the suprapubic area.  Musculoskeletal:     Lumbar back: She exhibits tenderness. She exhibits normal range of motion, no bony tenderness, no swelling, no edema, no pain and no spasm.       Arms:     Comments: Full range of motion with flexion, tension, lateral side bends. No bony tenderness.  Diffuse area of mild tenderness as marked central low back. No pain, numbness, tingling elicited with single leg raise bilaterally.   Skin:    General: Skin is  warm and dry.  Neurological:     Mental Status: She is alert.     Sensory: No sensory deficit.     Deep Tendon Reflexes:     Reflex Scores:      Patellar reflexes are 2+ on the right side and 2+ on the left side.    Comments: Sensation and strength intact bilateral lower extremities.  Psychiatric:        Speech: Speech normal.        Behavior: Behavior normal.        Thought Content: Thought content normal.        Assessment & Plan:   Problem List Items Addressed This Visit      Other   Suprapubic pressure - Primary    Afebrile.  Slight discomfort over suprapubic area on exam today.  No rebound, guarding.  Back pain appears to be dissipating.  Constipation resolved.  Discussed with patient that urinalysis did not appear grossly positive during visit, no leukocytes, nitrites, blood.  I am concerned that her adnexal mass  being the larger side may be etiology ( 7.8 x 6.3 x 4.6 cm right adnexal Mass).    She is scheduled to see GYN next week.  She will maintain this appointment.  Will start Pyridium over the weekend with plenty water, call patient with results of urine culture.  Advised over the weekend her pain were to worsen or new symptoms develop, she understands to go to emergency room       Relevant Medications   phenazopyridine (PYRIDIUM) 100 MG tablet    Other Visit Diagnoses    Dysuria       Relevant Orders   POCT Urinalysis Dipstick (Completed)   Urine Microscopic Only   Urine Culture         I am having  Brooke Murphy start on phenazopyridine. I am also having her maintain her bimatoprost, fluticasone, lidocaine, diclofenac sodium, omeprazole, metoprolol tartrate, gabapentin, montelukast, mirtazapine, atorvastatin, losartan, ipratropium-albuterol, levofloxacin, amLODipine, Cholecalciferol, HYDROcodone-acetaminophen, polyethylene glycol, glucose blood, and onetouch ultrasoft.   Meds ordered this encounter  Medications  . phenazopyridine (PYRIDIUM) 100 MG tablet    Sig: Take 1 tablet (100 mg total) by mouth 3 (three) times daily as needed for up to 2 days for pain.    Dispense:  6 tablet    Refill:  2    Order Specific Question:   Supervising Provider    Answer:   Crecencio Mc [[id number]]    Return precautions given.   Risks, benefits, and alternatives of the medications and treatment plan prescribed today were discussed, and patient expressed understanding.   Education regarding symptom management and diagnosis given to patient on AVS.  Continue to follow with Burnard Hawthorne, FNP for routine health maintenance.   Brooke Murphy and I agreed with plan.   [name], FNP

## 2018-10-09 NOTE — Assessment & Plan Note (Addendum)
Afebrile.  Slight discomfort over suprapubic area on exam today.  No rebound, guarding.  Back pain appears to be dissipating.  Constipation resolved.  Discussed with patient that urinalysis did not appear grossly positive during visit, no leukocytes, nitrites, blood.  I am concerned that her adnexal mass  being the larger side may be etiology ( 7.8 x 6.3 x 4.6 cm right adnexal Mass).    She is scheduled to see GYN next week.  She will maintain this appointment.  Will start Pyridium over the weekend with plenty water, call patient with results of urine culture.  Advised over the weekend her pain were to worsen or new symptoms develop, she understands to go to emergency room

## 2018-10-10 LAB — URINE CULTURE
MICRO NUMBER:: 70893
Result:: NO GROWTH
SPECIMEN QUALITY: ADEQUATE

## 2018-10-12 NOTE — Addendum Note (Signed)
Addended by: Arby Barrette on: 10/12/2018 10:12 AM   Modules accepted: Orders

## 2018-10-14 ENCOUNTER — Encounter: Payer: Self-pay | Admitting: Obstetrics and Gynecology

## 2018-10-27 ENCOUNTER — Telehealth: Payer: Self-pay

## 2018-10-27 NOTE — Telephone Encounter (Signed)
Referral received from Dr. Ouida Sills for adnexal mass. I have left Ms. Eagen a Advertising account executive to return call. She can be seen by Gyn Onc 2/5.

## 2018-10-28 ENCOUNTER — Inpatient Hospital Stay: Payer: BLUE CROSS/BLUE SHIELD | Attending: Obstetrics and Gynecology | Admitting: Obstetrics and Gynecology

## 2018-10-28 ENCOUNTER — Other Ambulatory Visit: Payer: Self-pay

## 2018-10-28 VITALS — BP 123/83 | HR 69 | Temp 95.6°F | Resp 18 | Ht 65.75 in | Wt 137.3 lb

## 2018-10-28 DIAGNOSIS — Z87891 Personal history of nicotine dependence: Secondary | ICD-10-CM

## 2018-10-28 DIAGNOSIS — D3911 Neoplasm of uncertain behavior of right ovary: Secondary | ICD-10-CM | POA: Diagnosis not present

## 2018-10-28 DIAGNOSIS — Z90710 Acquired absence of both cervix and uterus: Secondary | ICD-10-CM | POA: Diagnosis not present

## 2018-10-28 DIAGNOSIS — R109 Unspecified abdominal pain: Secondary | ICD-10-CM

## 2018-10-28 DIAGNOSIS — R1900 Intra-abdominal and pelvic swelling, mass and lump, unspecified site: Secondary | ICD-10-CM

## 2018-10-28 DIAGNOSIS — N83201 Unspecified ovarian cyst, right side: Secondary | ICD-10-CM | POA: Insufficient documentation

## 2018-10-28 NOTE — Patient Instructions (Signed)
DIVISION OF GYNECOLOGIC ONCOLOGY BOWEL PREP   The following instructions are extremely important to prepare for your surgery. Please follow them carefully   Step 1: Liquid Diet Instructions              Clear Liquid Diet for GYN Oncology Patients Day Before Surgery The day before your scheduled surgery DO NOT EAT any solid foods.  We do want you to drink enough liquids, but NO MILK products.  We do not want you to be dehydrated.  Clear liquids are defined as no milk products and no pieces of any solid food. Drink at least 64 oz. of fluid.  The following are all approved for you to drink the day before you surgery.  Chicken, Beef or Vegetable Broth (bouillon or consomm) - NO BROTH AFTER MIDNIGHT  Plain Jello  (no fruit)  Water  Strained lemonade or fruit punch  Gatorade (any flavor)  CLEAR Ensure or Boost Breeze  Fruit juices without pulp, such as apple, grape, or cranberry juice  Clear sodas - NO SODA AFTER MIDNIGHT  Ice Pops without bits of fruit or fruit pulp  Honey  Tea or coffee without milk or cream                 Any foods not on the above list should be avoided                                                                                             Step 2: Laxatives           The evening before surgery:   Time: around 5pm   Follow these instructions carefully.   Administer 1 Dulcolax suppository according to manufacturer instructions on the box. You will need to purchase this laxative at a pharmacy or grocery store.    Individual responses to laxatives vary; this prep may cause multiple bowel movements. It often works in 30 minutes and may take as long as 3 hours. Stay near an available bathroom.    It is important to stay hydrated. Ensure you are still drinking clear liquids.     IMPORTANT: FOR YOUR SAFETY, WE WILL HAVE TO CANCEL YOUR SURGERY IF YOU DO NOT FOLLOW THESE INSTRUCTIONS.    Do not eat anything after midnight (including gum or  candy) prior to your surgery.  Avoid drinking carbonated beverages after midnight.  You can have clear liquids up until one hour before you arrive at the hospital. "Nothing by mouth" means no liquids, gum, candy, etc for one hour before your arrival time.      Laparoscopy Laparoscopy is a procedure to diagnose diseases in the abdomen. During the procedure, a thin, lighted, pencil-sized instrument called a laparoscope is inserted into the abdomen through an incision. The laparoscope allows your health care provider to look at the organs inside your body. LET YOUR HEALTH CARE PROVIDER KNOW ABOUT:  Any allergies you have.  All medicines you are taking, including vitamins, herbs, eye drops, creams, and over-the-counter medicines.  Previous problems you or members of your family have had with the use of anesthetics.  Any blood   disorders you have.  Previous surgeries you have had.  Medical conditions you have. RISKS AND COMPLICATIONS  Generally, this is a safe procedure. However, problems can occur, which may include:  Infection.  Bleeding.  Damage to other organs.  Allergic reaction to the anesthetics used during the procedure. BEFORE THE PROCEDURE  Do not eat or drink anything after midnight on the night before the procedure or as directed by your health care provider.  Ask your health care provider about: ? Changing or stopping your regular medicines. ? Taking medicines such as aspirin and ibuprofen. These medicines can thin your blood. Do not take these medicines before your procedure if your health care provider instructs you not to.  Plan to have someone take you home after the procedure. PROCEDURE  You may be given a medicine to help you relax (sedative).  You will be given a medicine to make you sleep (general anesthetic).  Your abdomen will be inflated with a gas. This will make your organs easier to see.  Small incisions will be made in your abdomen.  A  laparoscope and other small instruments will be inserted into the abdomen through the incisions.  A tissue sample may be removed from an organ in the abdomen for examination.  The instruments will be removed from the abdomen.  The gas will be released.  The incisions will be closed with stitches (sutures). AFTER THE PROCEDURE  Your blood pressure, heart rate, breathing rate, and blood oxygen level will be monitored often until the medicines you were given have worn off.   This information is not intended to replace advice given to you by your health care provider. Make sure you discuss any questions you have with your health care provider.                                             Bowel Symptoms After Surgery After gynecologic surgery, women often have temporary changes in bowel function (constipation and gas pain).  Following are tips to help prevent and treat common bowel problems.  It also tells you when to call the doctor.  This is important because some symptoms might be a sign of a more serious bowel problem such as obstruction (bowel blockage).  These problems are rare but can happen after gynecologic surgery.   Besides surgery, what can temporarily affect bowel function? 1. Dietary changes   2. Decreased physical activity   3.Antibiotics   4. Pain medication   How can I prevent constipation (three days or more without a stool)? 1. Include fiber in your diet: whole grains, raw or dried fruits & vegetables, prunes, prune/pear juiceDrink at least 8 glasses of liquid (preferably water) every day 2. Avoid: ? Gas forming foods such as broccoli, beans, peas, salads, cabbage, sweet potatoes ? Greasy, fatty, or fried foods 3. Activity helps bowel function return to normal, walk around the house at least 3-4 times each day for 15 minutes or longer, if tolerated.  Rocking in a rocking chair is preferable to sitting still. 4. Stool softeners: these are not laxatives, but serve to soften  the stool to avoid straining.  Take 2-4 times a day until normal bowel function returns         Examples: Colace or generic equivalent (Docusate) 5. Bulk laxatives: provide a concentrated source of fiber.  They do not stimulate the bowel.    the bowel.  Take 1-2 times each day until normal bowel function return.              Examples: Citrucel, Metamucil, Fiberal, Fibercon   What can I take for "Gas Pains"? 1. Simethicone (Mylicon, Gas-X, Maalox-Gas, Mylanta-Gas) take 3-4 times a day 2. Maalox Regular - take 3-4 times a day 3. Mylanta Regular - take 3-4 times a day   What can I take if I become constipated? 1. Start with stool softeners and add additional laxatives below as needed to have a bowel movement every 1-2 days  2. Stool softeners 1-2 tablets, 2 times a day 3. Senokot 1-2 tablets, 1-2 times a day 4. Glycerin suppository can soften hard stool take once a day 5. Bisacodyl suppository once a day  6. Milk of Magnesia 30 mL 1-2 times a day 7. Fleets or tap water enema    What can I do for nausea?  1. Limit most solid foods for 24-48 hours 2. Continue eating small frequent amounts of liquids and/or bland soft foods ? Toast, crackers, cooked cereal (grits, cream of wheat, rice) 3. Benadryl: a mild anti-nausea medicine can be obtained without a prescription. May cause drowsiness, especially if taken with narcotic pain medicines 4. Contact provider for prescription nausea medication     What can I do, or take for diarrhea (more than five loose stools per day)? 1. Drink plenty of clear fluids to prevent dehydration 2. May take Kaopectate, Pepto-Bismol, Imodium, or probiotics for 1-2 days 3. Anusol or Preparation-H can be helpful for hemorrhoids and irritated tissue around anus   When should I call the doctor?             CONSTIPATION:   Not relieved after three days following the above program VOMITING:  That contains blood, "coffee ground" material  More the three times/hour and unable to  keep down nausea medication for more than eight hours  With dry mouth, dark or strong urine, feeling light-headed, dizzy, or confused  With severe abdominal pain or bloating for more than 24 hours DIARRHEA:  That continues for more then 24-48 hours despite treatment  That contains blood or tarry material  With dry mouth, dark or strong urine, feeling light~headed, dizzy, or confused FEVER:  101 F or higher along with nausea, vomiting, gas pain, diarrhea UNABLE TO:  Pass gas from rectum for more than 24 hours  Tolerate liquids by mouth for more than 24 hours        Diagnostic Laparoscopy, Care After This sheet gives you information about how to care for yourself after your procedure. Your health care provider may also give you more specific instructions. If you have problems or questions, contact your health care provider. What can I expect after the procedure? After the procedure, it is common to have:  Mild discomfort in the abdomen.  Sore throat. Women who have laparoscopy with pelvic examination may have mild cramping and fluid coming from the vagina for a few days after the procedure. Follow these instructions at home: Medicines  Take over-the-counter and prescription medicines only as told by your health care provider.  If you were prescribed an antibiotic medicine, take it as told by your health care provider. Do not stop taking the antibiotic even if you start to feel better. Driving  Do not drive for 24 hours if you were given a medicine to help you relax (sedative) during your procedure.  Do not drive or use heavy machinery while taking prescription  pain medicine. Bathing  Do not take baths, swim, or use a hot tub until your health care provider approves. You may take showers. Incision care   Follow instructions from your health care provider about how to take care of your incisions. Make sure you: ? Wash your hands with soap and water before you change  your bandage (dressing). If soap and water are not available, use hand sanitizer. ? Change your dressing as told by your health care provider. ? Leave stitches (sutures), skin glue, or adhesive strips in place. These skin closures may need to stay in place for 2 weeks or longer. If adhesive strip edges start to loosen and curl up, you may trim the loose edges. Do not remove adhesive strips completely unless your health care provider tells you to do that.  Check your incision areas every day for signs of infection. Check for: ? Redness, swelling, or pain. ? Fluid or blood. ? Warmth. ? Pus or a bad smell. Activity  Return to your normal activities as told by your health care provider. Ask your health care provider what activities are safe for you.  Do not lift anything that is heavier than 10 lb (4.5 kg), or the limit that you are told, until your health care provider says that it is safe. General instructions  To prevent or treat constipation while you are taking prescription pain medicine, your health care provider may recommend that you: ? Drink enough fluid to keep your urine pale yellow. ? Take over-the-counter or prescription medicines. ? Eat foods that are high in fiber, such as fresh fruits and vegetables, whole grains, and beans. ? Limit foods that are high in fat and processed sugars, such as fried and sweet foods.  Do not use any products that contain nicotine or tobacco, such as cigarettes and e-cigarettes. If you need help quitting, ask your health care provider.  Keep all follow-up visits as told by your health care provider. This is important. Contact a health care provider if:  You develop shoulder pain.  You feel lightheaded or faint.  You are unable to pass gas or have a bowel movement.  You feel nauseous or you vomit.  You develop a rash.  You have redness, swelling, or pain around any incision.  You have fluid or blood coming from any incision.  Any incision  feels warm to the touch.  You have pus or a bad smell coming from any incision.  You have a fever or chills. Get help right away if:  You have severe pain.  You have vomiting that does not go away.  You have heavy bleeding from the vagina.  Any incision opens.  You have trouble breathing.  You have chest pain. Summary  After the procedure, it is common to have mild discomfort in the abdomen and a sore throat.  Check your incision areas every day for signs of infection.  Return to your normal activities as told by your health care provider. Ask your health care provider what activities are safe for you. This information is not intended to replace advice given to you by your health care provider. Make sure you discuss any questions you have with your health care provider. Document Released: 08/21/2015 Document Revised: 03/05/2017 Document Reviewed: 03/05/2017 Elsevier Interactive Patient Education  [employer] [employer].

## 2018-10-28 NOTE — Progress Notes (Signed)
Here for new pt eval. Per pt she found out" I had a large mass,and a cyst on my R ovary " they are concerned so they sent me here.  Denies pain now ( did initially ) stated she had pain w urination and constipation issues.   Stated she had " vaginal bacterial infection " now resolved .

## 2018-10-28 NOTE — Progress Notes (Signed)
Surgery scheduled for 11/11/18 with Dr. Leafy Ro and Dr. Fransisca Connors. Pre and post operative teaching completed and copy of teaching provided in the AVS. Notified Ms. Brooke Murphy, with her PAT appointment details. PAT appointment is scheduled for 11/04/18 at 1115, in the [place], [id number]. Directions to the MAC given. Surgery booking request faxed to OR scheduling with confirmation of receipt. Copy of consent faxed to PAT with confirmation of receipt. Original consent sent to medical records for filing. Follow up arranged for 12/16/18 at 1100. We can possibly cancel this with benign findings and she can follow up with Dr. Leafy Ro or Dr. Ouida Sills.

## 2018-10-28 NOTE — H&P (View-Only) (Signed)
Gynecologic Oncology Consult Visit   Referring Provider: Dr. Ouida Sills  Chief Complaint: right ovarian mass  Subjective:  Brooke Murphy is a 58 y.o. G7, P5 female who is seen in consultation from Dr. Ouida Sills for 8 cm painful complex right ovarian mass.  Patient initially presented to ER on 10/02/2018 7 with right lower quadrant abdominal pain.  CT abdomen/pelvis with contrast revealed interval growth of large complex mass measuring 7.6 x 5.2 cm in the right adnexal region.  Left ovary not visualized.  She is status post supracervical hysterectomy for menorrhagia and pain about 15 years ago.  Ultrasound demonstrated 7.8 x 6.3 x 4.6 multilocular mixed cystic and solid right adnexal mass with multiple thickened irregular internal septations with vascularity demonstrated within the thickened septations and within the solid lesions of the mass.  Mass new since 12/22/2010 CT.  No abnormal findings in the vaginal cuff.  No free fluid in pelvis. She was referred to GYN patient also developed bacterial vaginosis.  Patient was seen by Dr. Ouida Sills on 10/15/2018.   AFP- 4 (normal) hCG- 2.0 (normal) LDH- 228 (elevated) CA 125- 27.5 (normal)  Repeat ultrasound on 10/16/2018 measuring adnexal mass at 8.32 x 5.56 x 8.39 cm, septations equals 0.39 cm, 0.44 cm, 0.69 cm.  Right adnexal mass volume = 202.98 mL.   Patient denies weight loss.  No family history of ovarian cancer.  1 maternal aunt with breast cancer.  Last colonoscopy was 10/11/2011. Last mammogram was 08/26/2018: BI-RADS Category 1-negative.  Last Pap smear was12/20/2019 reported as NILM, HPV Negative. She has history of supracervical hysterectomy for menorrhagia approximately 15 years ago. Not on medications for her diabetes- currently diet controlled- last ha1c was 6.5 (09/28/2018).    Problem List: Patient Active Problem List   Diagnosis Date Noted  . Cyst of right ovary 10/28/2018  . Suprapubic pressure 10/09/2018  . Diabetes mellitus  without complication ([place]) 13/04/6577  . Adnexal mass 10/05/2018  . Constipation 10/05/2018  . Hypokalemia 09/23/2018  . COPD with acute exacerbation ([place]) 09/23/2018  . Aortic atherosclerosis ([place]) 09/09/2018  . Smoker 07/20/2018  . Goiter 07/20/2018  . Right arm pain 07/20/2018  . Encounter for screening for malignant neoplasm of respiratory organs 07/20/2018  . Headache 11/17/2017  . Sinus pressure 11/17/2017  . Hyperthyroidism 04/29/2017  . Routine physical examination 02/12/2017  . GERD (gastroesophageal reflux disease) 08/27/2016  . HLD (hyperlipidemia) 08/27/2016  . Depression 08/27/2016  . Right shoulder pain 06/15/2015  . Arthritis 04/08/2015  . HTN (hypertension) 12/10/2014  . Allergic rhinitis 12/10/2014  . Thoracic back pain 12/10/2014    Past Medical History: Past Medical History:  Diagnosis Date  . Brain aneurysm 1999   [facility]  . Diabetes mellitus without complication ([place])   . Hyperlipidemia   . Hypertension   . Rheumatoid arthritis ([place]) 2002   s/p methotrexate, humira and embrel  . Ulcer of abdomen wall [facility])     Past Surgical History: Past Surgical History:  Procedure Laterality Date  . ABDOMINAL HYSTERECTOMY  2005   ovaries remain; WITH CERVIX  . BRAIN SURGERY Left 11/27/1997   aneurysm    Past Gynecologic History:  Last menstrual period (approximately 15 years ago) s/p supracervical hysterectomy for menorrhagia approximately 2005.  History of HPV.  Last pap- 09/11/2018- NILM, HPV negative.  No vaginal discharge or bleeding.   OB History:  OB History  No obstetric history on file.    Family History: Family History  Problem Relation Age of Onset  . Hypertension Mother   .  Aneurysm Mother 28       died  . Hyperlipidemia Sister   . Hypertension Sister   . Cancer Father 70       throat cancer  . Diabetes Maternal Aunt   . Breast cancer Maternal Aunt 65  . Hypertension Maternal Grandmother   . Stroke Maternal Grandmother   . Colon cancer  Neg Hx     Social History: Social History   Socioeconomic History  . Marital status: Single    Spouse name: Not on file  . Number of children: 5  . Years of education: Not on file  . Highest education level: Not on file  Occupational History  . Not on file  Social Needs  . Financial resource strain: Not on file  . Food insecurity:    Worry: Not on file    Inability: Not on file  . Transportation needs:    Medical: Not on file    Non-medical: Not on file  Tobacco Use  . Smoking status: Former Smoker    Packs/day: 0.90    Years: 37.00    Pack years: 33.30    Types: Cigarettes    Last attempt to quit: 09/03/2018    Years since quitting: 0.1  . Smokeless tobacco: Never Used  Substance and Sexual Activity  . Alcohol use: No    Alcohol/week: 0.0 standard drinks  . Drug use: No  . Sexual activity: Not Currently  Lifestyle  . Physical activity:    Days per week: Not on file    Minutes per session: Not on file  . Stress: Not on file  Relationships  . Social connections:    Talks on phone: Not on file    Gets together: Not on file    Attends religious service: Not on file    Active member of club or organization: Not on file    Attends meetings of clubs or organizations: Not on file    Relationship status: Not on file  . Intimate partner violence:    Fear of current or ex partner: Not on file    Emotionally abused: Not on file    Physically abused: Not on file    Forced sexual activity: Not on file  Other Topics Concern  . Not on file  Social History Narrative   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; ticket coordinator    Allergies: Allergies  Allergen Reactions  . Penicillins     Has patient had a PCN reaction causing immediate rash, facial/tongue/throat swelling, SOB or lightheadedness with hypotension: Yes Has patient had a PCN reaction causing severe rash involving mucus membranes or skin necrosis: No Has patient had a PCN reaction that required hospitalization:  No Has patient had a PCN reaction occurring within the last 10 years: No If all of the above answers are "NO", then may proceed with Cephalosporin use.  . Celebrex [Celecoxib] Rash    Rash. Upset stomach  . Lodine [Etodolac] Rash    Rash. Upset stomach    Current Medications: Current Outpatient Medications  Medication Sig Dispense Refill  . amLODipine (NORVASC) 5 MG tablet Take 1 tablet (5 mg total) by mouth daily. 90 tablet 3  . atorvastatin (LIPITOR) 20 MG tablet Take 1 tablet (20 mg total) by mouth daily. 90 tablet 2  . bimatoprost (LUMIGAN) 0.01 % SOLN Place 1 drop into both eyes at bedtime.    . diclofenac sodium (VOLTAREN) 1 % GEL Apply 4 g topically 4 (four) times daily. 1 Tube 3  .  fluticasone (FLONASE) 50 MCG/ACT nasal spray Place 2 sprays into both nostrils daily. (Patient taking differently: Place 2 sprays into both nostrils daily as needed for allergies. ) 16 g 6  . ipratropium-albuterol (DUONEB) 0.5-2.5 (3) MG/3ML SOLN Take 3 mLs by nebulization every 4 (four) hours as needed. 360 mL 1  . losartan (COZAAR) 100 MG tablet Take 1 tablet (100 mg total) by mouth daily. 90 tablet 3  . metoprolol tartrate (LOPRESSOR) 25 MG tablet TAKE 1 TABLET (25 MG TOTAL) BY MOUTH 2 (TWO) TIMES DAILY. 180 tablet 0  . mirtazapine (REMERON) 30 MG tablet Take 1 tablet (30 mg total) by mouth at bedtime. 90 tablet 1  . montelukast (SINGULAIR) 10 MG tablet TAKE 1 TABLET BY MOUTH EVERYDAY AT BEDTIME 30 tablet 3  . omeprazole (PRILOSEC) 20 MG capsule Take 1 capsule (20 mg total) by mouth daily. 30 capsule 3  . Vitamin D, Ergocalciferol, (DRISDOL) 1.25 MG (50000 UT) CAPS capsule     . benzonatate (TESSALON) 200 MG capsule Take 200 mg by mouth 2 (two) times daily as needed.     . gabapentin (NEURONTIN) 100 MG capsule TAKE 1 CAPSULE (100 MG TOTAL) BY MOUTH 3 (THREE) TIMES DAILY AS NEEDED (FOR HEADACHE/NERVE PAIN). (Patient not taking: Reported on 10/28/2018) 90 capsule 0  . glucose blood (ONETOUCH VERIO) test  strip Used to check blood sugar once daily. (Patient not taking: Reported on 10/28/2018) 50 each 3  . HYDROcodone-acetaminophen (NORCO) 5-325 MG tablet Take 1 tablet by mouth every 4 (four) hours as needed for up to 12 doses for moderate pain. (Patient not taking: Reported on 10/28/2018) 12 tablet 0  . Lancets (ONETOUCH ULTRASOFT) lancets Use as instructed (Patient not taking: Reported on 10/28/2018) 100 each 3  . lidocaine (LIDODERM) 5 % Place 1 patch onto the skin daily. Remove & Discard patch within 12 hours. (Patient not taking: Reported on 10/28/2018) 30 patch 0  . polyethylene glycol (MIRALAX / GLYCOLAX) packet Take 17 g by mouth daily. Mix one tablespoon with 8oz of your favorite juice or water every day until you are having soft formed stools. Then start taking once daily if you didn't have a stool the day before. (Patient not taking: Reported on 10/28/2018) 30 each 0   No current facility-administered medications for this visit.     Review of Systems General: negative for fevers, chills, fatigue, changes in sleep, changes in weight or appetite Skin: negative for changes in color, texture, moles or lesions Eyes: negative for changes in vision, pain, diplopia HEENT: negative for change in hearing, pain, discharge, tinnitus, vertigo, voice changes, sore throat, neck masses Breasts: negative for breast lumps Pulmonary: negative for dyspnea, orthopnea, productive cough Cardiac: negative for palpitations, syncope, pain, discomfort, pressure Gastrointestinal: negative for dysphagia, nausea, vomiting, jaundice, pain, constipation, diarrhea, hematemesis, hematochezia Genitourinary/Sexual: negative for dysuria, discharge, hesitancy, nocturia, retention, stones, infections, STD's, incontinence Ob/Gyn: negative for irregular bleeding. Positive for pain Musculoskeletal: negative for pain, stiffness, swelling, range of motion limitation Hematology: negative for easy bruising, bleeding Neurologic/Psych:  negative for headaches, seizures, paralysis, weakness, tremor, change in gait, change in sensation, mood swings, depression, anxiety, change in memory   Objective:  Physical Examination:  BP 123/83 (BP Location: Left Arm, Patient Position: Sitting)   Pulse 69   Temp (!) 95.6 F (35.3 C) (Tympanic)   Resp 18   Ht 5' 5.75" (1.67 m)   Wt 137 lb 4.8 oz (62.3 kg)   BMI 22.33 kg/m     ECOG Performance Status:  1 - Symptomatic but completely ambulatory  GENERAL: Patient is a well appearing female in no acute distress HEENT:  Sclerae anicteric.  Oropharynx clear and moist.  NODES:  No cervical, or inguinal lymphadenopathy palpated LUNGS:  Clear to auscultation bilaterally.  No wheezes or rhonchi. HEART:  Regular rate and rhythm. No murmur appreciated. ABDOMEN:  Soft. No organomegaly palpated. Right tenderness to palpation EXTREMITIES:  No peripheral edema.   SKIN:  Clear with no obvious rashes or skin changes.  NEURO:  Nonfocal. Well oriented.  Appropriate affect.  Pelvic: Exam Chaperoned by NP EGBUS: no lesions Cervix: no lesions, nontender, mobile Vagina: no lesions, no discharge or bleeding Adnexa: palpable mass posteriorly in right pelvis. Rectovaginal: confirmatory  Lab Review No labs on site today.  Previous labs including tumor markers independently reviewed by myself and discussed with patient.  Radiologic Imaging: Above imaging including ultrasounds and CT scans independently reviewed by myself and discussed with patient.    Assessment:  Brooke Murphy is a 58 y.o. female diagnosed with 8 cm complex painful right ovarian mass.  Normal CA125 reassuring, but she does have some risk of cancer based on [place] characteristics.  In addition, she is likely to have more episodes of pain like the one that took her to the ED.      Medical co-morbidities complicating care: diet controlled diabetes, history of hyperthyroidism with normal TSH last month, remote history of rheumatoid  arthritis, brain aneurysm.  Plan:   Problem List Items Addressed This Visit      Endocrine   Cyst of right ovary    Other Visit Diagnoses    Pelvic mass    -  Primary     We discussed options for management including laparoscopic bilateral salpingoophorectomy and possible staging including pelvic aortic node dissection if cancer on frozen section on 11/11/18.  Dr Leonides Schanz from [facility] can assist in the surgery.    The risks of surgery were discussed in detail and she understands these to include infection; cervical incompetence; failure to remove the entire affected area; recurrence of dysplasia; injury to adjacent organs such as bowel, bladder, blood vessels, ureters, vagina, and nerves; bleeding which may require blood transfusion; anesthesia risk; thromboembolic events; possible death; unforeseen complications; possible need for further procedures; medical complications such as heart attack, stroke, pleural effusion and pneumonia.  The patient will receive DVT and antibiotic prophylaxis as indicated.  She voiced a clear understanding.  She had the opportunity to ask questions and written informed consent was obtained today.   The patient's diagnosis, an outline of the further diagnostic and laboratory studies which will be required, the recommendation for surgery, and alternatives were discussed with her and her accompanying family members.  All questions were answered to their satisfaction.  A total of 40 minutes were spent with the patient/family today; 50% was spent in education, counseling and coordination of care for pelvic mass.    [name], MD    CC:  Schermerhorn, Gwen Her, MD [address] [facility] [place], [place] [address] [phone]

## 2018-10-28 NOTE — Progress Notes (Signed)
Gynecologic Oncology Consult Visit   Referring Provider: Dr. Ouida Sills  Chief Complaint: right ovarian mass  Subjective:  Brooke Murphy is a 58 y.o. G7, P5 female who is seen in consultation from Dr. Ouida Sills for 8 cm painful complex right ovarian mass.  Patient initially presented to ER on 10/02/2018 7 with right lower quadrant abdominal pain.  CT abdomen/pelvis with contrast revealed interval growth of large complex mass measuring 7.6 x 5.2 cm in the right adnexal region.  Left ovary not visualized.  She is status post supracervical hysterectomy for menorrhagia and pain about 15 years ago.  Ultrasound demonstrated 7.8 x 6.3 x 4.6 multilocular mixed cystic and solid right adnexal mass with multiple thickened irregular internal septations with vascularity demonstrated within the thickened septations and within the solid lesions of the mass.  Mass new since 12/22/2010 CT.  No abnormal findings in the vaginal cuff.  No free fluid in pelvis. She was referred to GYN patient also developed bacterial vaginosis.  Patient was seen by Dr. Ouida Sills on 10/15/2018.   AFP- 4 (normal) hCG- 2.0 (normal) LDH- 228 (elevated) CA 125- 27.5 (normal)  Repeat ultrasound on 10/16/2018 measuring adnexal mass at 8.32 x 5.56 x 8.39 cm, septations equals 0.39 cm, 0.44 cm, 0.69 cm.  Right adnexal mass volume = 202.98 mL.   Patient denies weight loss.  No family history of ovarian cancer.  1 maternal aunt with breast cancer.  Last colonoscopy was 10/11/2011. Last mammogram was 08/26/2018: BI-RADS Category 1-negative.  Last Pap smear was12/20/2019 reported as NILM, HPV Negative. She has history of supracervical hysterectomy for menorrhagia approximately 15 years ago. Not on medications for her diabetes- currently diet controlled- last ha1c was 6.5 (09/28/2018).    Problem List: Patient Active Problem List   Diagnosis Date Noted  . Cyst of right ovary 10/28/2018  . Suprapubic pressure 10/09/2018  . Diabetes mellitus  without complication ([place]) 38/06/1750  . Adnexal mass 10/05/2018  . Constipation 10/05/2018  . Hypokalemia 09/23/2018  . COPD with acute exacerbation ([place]) 09/23/2018  . Aortic atherosclerosis ([place]) 09/09/2018  . Smoker 07/20/2018  . Goiter 07/20/2018  . Right arm pain 07/20/2018  . Encounter for screening for malignant neoplasm of respiratory organs 07/20/2018  . Headache 11/17/2017  . Sinus pressure 11/17/2017  . Hyperthyroidism 04/29/2017  . Routine physical examination 02/12/2017  . GERD (gastroesophageal reflux disease) 08/27/2016  . HLD (hyperlipidemia) 08/27/2016  . Depression 08/27/2016  . Right shoulder pain 06/15/2015  . Arthritis 04/08/2015  . HTN (hypertension) 12/10/2014  . Allergic rhinitis 12/10/2014  . Thoracic back pain 12/10/2014    Past Medical History: Past Medical History:  Diagnosis Date  . Brain aneurysm 1999   [facility]  . Diabetes mellitus without complication ([place])   . Hyperlipidemia   . Hypertension   . Rheumatoid arthritis ([place]) 2002   s/p methotrexate, humira and embrel  . Ulcer of abdomen wall [facility])     Past Surgical History: Past Surgical History:  Procedure Laterality Date  . ABDOMINAL HYSTERECTOMY  2005   ovaries remain; WITH CERVIX  . BRAIN SURGERY Left 11/27/1997   aneurysm    Past Gynecologic History:  Last menstrual period (approximately 15 years ago) s/p supracervical hysterectomy for menorrhagia approximately 2005.  History of HPV.  Last pap- 09/11/2018- NILM, HPV negative.  No vaginal discharge or bleeding.   OB History:  OB History  No obstetric history on file.    Family History: Family History  Problem Relation Age of Onset  . Hypertension Mother   .  Aneurysm Mother 109       died  . Hyperlipidemia Sister   . Hypertension Sister   . Cancer Father 70       throat cancer  . Diabetes Maternal Aunt   . Breast cancer Maternal Aunt 65  . Hypertension Maternal Grandmother   . Stroke Maternal Grandmother   . Colon cancer  Neg Hx     Social History: Social History   Socioeconomic History  . Marital status: Single    Spouse name: Not on file  . Number of children: 5  . Years of education: Not on file  . Highest education level: Not on file  Occupational History  . Not on file  Social Needs  . Financial resource strain: Not on file  . Food insecurity:    Worry: Not on file    Inability: Not on file  . Transportation needs:    Medical: Not on file    Non-medical: Not on file  Tobacco Use  . Smoking status: Former Smoker    Packs/day: 0.90    Years: 37.00    Pack years: 33.30    Types: Cigarettes    Last attempt to quit: 09/03/2018    Years since quitting: 0.1  . Smokeless tobacco: Never Used  Substance and Sexual Activity  . Alcohol use: No    Alcohol/week: 0.0 standard drinks  . Drug use: No  . Sexual activity: Not Currently  Lifestyle  . Physical activity:    Days per week: Not on file    Minutes per session: Not on file  . Stress: Not on file  Relationships  . Social connections:    Talks on phone: Not on file    Gets together: Not on file    Attends religious service: Not on file    Active member of club or organization: Not on file    Attends meetings of clubs or organizations: Not on file    Relationship status: Not on file  . Intimate partner violence:    Fear of current or ex partner: Not on file    Emotionally abused: Not on file    Physically abused: Not on file    Forced sexual activity: Not on file  Other Topics Concern  . Not on file  Social History Narrative   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; ticket coordinator    Allergies: Allergies  Allergen Reactions  . Penicillins     Has patient had a PCN reaction causing immediate rash, facial/tongue/throat swelling, SOB or lightheadedness with hypotension: Yes Has patient had a PCN reaction causing severe rash involving mucus membranes or skin necrosis: No Has patient had a PCN reaction that required hospitalization:  No Has patient had a PCN reaction occurring within the last 10 years: No If all of the above answers are "NO", then may proceed with Cephalosporin use.  . Celebrex [Celecoxib] Rash    Rash. Upset stomach  . Lodine [Etodolac] Rash    Rash. Upset stomach    Current Medications: Current Outpatient Medications  Medication Sig Dispense Refill  . amLODipine (NORVASC) 5 MG tablet Take 1 tablet (5 mg total) by mouth daily. 90 tablet 3  . atorvastatin (LIPITOR) 20 MG tablet Take 1 tablet (20 mg total) by mouth daily. 90 tablet 2  . bimatoprost (LUMIGAN) 0.01 % SOLN Place 1 drop into both eyes at bedtime.    . diclofenac sodium (VOLTAREN) 1 % GEL Apply 4 g topically 4 (four) times daily. 1 Tube 3  .  fluticasone (FLONASE) 50 MCG/ACT nasal spray Place 2 sprays into both nostrils daily. (Patient taking differently: Place 2 sprays into both nostrils daily as needed for allergies. ) 16 g 6  . ipratropium-albuterol (DUONEB) 0.5-2.5 (3) MG/3ML SOLN Take 3 mLs by nebulization every 4 (four) hours as needed. 360 mL 1  . losartan (COZAAR) 100 MG tablet Take 1 tablet (100 mg total) by mouth daily. 90 tablet 3  . metoprolol tartrate (LOPRESSOR) 25 MG tablet TAKE 1 TABLET (25 MG TOTAL) BY MOUTH 2 (TWO) TIMES DAILY. 180 tablet 0  . mirtazapine (REMERON) 30 MG tablet Take 1 tablet (30 mg total) by mouth at bedtime. 90 tablet 1  . montelukast (SINGULAIR) 10 MG tablet TAKE 1 TABLET BY MOUTH EVERYDAY AT BEDTIME 30 tablet 3  . omeprazole (PRILOSEC) 20 MG capsule Take 1 capsule (20 mg total) by mouth daily. 30 capsule 3  . Vitamin D, Ergocalciferol, (DRISDOL) 1.25 MG (50000 UT) CAPS capsule     . benzonatate (TESSALON) 200 MG capsule Take 200 mg by mouth 2 (two) times daily as needed.     . gabapentin (NEURONTIN) 100 MG capsule TAKE 1 CAPSULE (100 MG TOTAL) BY MOUTH 3 (THREE) TIMES DAILY AS NEEDED (FOR HEADACHE/NERVE PAIN). (Patient not taking: Reported on 10/28/2018) 90 capsule 0  . glucose blood (ONETOUCH VERIO) test  strip Used to check blood sugar once daily. (Patient not taking: Reported on 10/28/2018) 50 each 3  . HYDROcodone-acetaminophen (NORCO) 5-325 MG tablet Take 1 tablet by mouth every 4 (four) hours as needed for up to 12 doses for moderate pain. (Patient not taking: Reported on 10/28/2018) 12 tablet 0  . Lancets (ONETOUCH ULTRASOFT) lancets Use as instructed (Patient not taking: Reported on 10/28/2018) 100 each 3  . lidocaine (LIDODERM) 5 % Place 1 patch onto the skin daily. Remove & Discard patch within 12 hours. (Patient not taking: Reported on 10/28/2018) 30 patch 0  . polyethylene glycol (MIRALAX / GLYCOLAX) packet Take 17 g by mouth daily. Mix one tablespoon with 8oz of your favorite juice or water every day until you are having soft formed stools. Then start taking once daily if you didn't have a stool the day before. (Patient not taking: Reported on 10/28/2018) 30 each 0   No current facility-administered medications for this visit.     Review of Systems General: negative for fevers, chills, fatigue, changes in sleep, changes in weight or appetite Skin: negative for changes in color, texture, moles or lesions Eyes: negative for changes in vision, pain, diplopia HEENT: negative for change in hearing, pain, discharge, tinnitus, vertigo, voice changes, sore throat, neck masses Breasts: negative for breast lumps Pulmonary: negative for dyspnea, orthopnea, productive cough Cardiac: negative for palpitations, syncope, pain, discomfort, pressure Gastrointestinal: negative for dysphagia, nausea, vomiting, jaundice, pain, constipation, diarrhea, hematemesis, hematochezia Genitourinary/Sexual: negative for dysuria, discharge, hesitancy, nocturia, retention, stones, infections, STD's, incontinence Ob/Gyn: negative for irregular bleeding. Positive for pain Musculoskeletal: negative for pain, stiffness, swelling, range of motion limitation Hematology: negative for easy bruising, bleeding Neurologic/Psych:  negative for headaches, seizures, paralysis, weakness, tremor, change in gait, change in sensation, mood swings, depression, anxiety, change in memory   Objective:  Physical Examination:  BP 123/83 (BP Location: Left Arm, Patient Position: Sitting)   Pulse 69   Temp (!) 95.6 F (35.3 C) (Tympanic)   Resp 18   Ht 5' 5.75" (1.67 m)   Wt 137 lb 4.8 oz (62.3 kg)   BMI 22.33 kg/m     ECOG Performance Status:  1 - Symptomatic but completely ambulatory  GENERAL: Patient is a well appearing female in no acute distress HEENT:  Sclerae anicteric.  Oropharynx clear and moist.  NODES:  No cervical, or inguinal lymphadenopathy palpated LUNGS:  Clear to auscultation bilaterally.  No wheezes or rhonchi. HEART:  Regular rate and rhythm. No murmur appreciated. ABDOMEN:  Soft. No organomegaly palpated. Right tenderness to palpation EXTREMITIES:  No peripheral edema.   SKIN:  Clear with no obvious rashes or skin changes.  NEURO:  Nonfocal. Well oriented.  Appropriate affect.  Pelvic: Exam Chaperoned by NP EGBUS: no lesions Cervix: no lesions, nontender, mobile Vagina: no lesions, no discharge or bleeding Adnexa: palpable mass posteriorly in right pelvis. Rectovaginal: confirmatory  Lab Review No labs on site today.  Previous labs including tumor markers independently reviewed by myself and discussed with patient.  Radiologic Imaging: Above imaging including ultrasounds and CT scans independently reviewed by myself and discussed with patient.    Assessment:  Jasmain Ahlberg is a 58 y.o. female diagnosed with 8 cm complex painful right ovarian mass.  Normal CA125 reassuring, but she does have some risk of cancer based on [place] characteristics.  In addition, she is likely to have more episodes of pain like the one that took her to the ED.      Medical co-morbidities complicating care: diet controlled diabetes, history of hyperthyroidism with normal TSH last month, remote history of rheumatoid  arthritis, brain aneurysm.  Plan:   Problem List Items Addressed This Visit      Endocrine   Cyst of right ovary    Other Visit Diagnoses    Pelvic mass    -  Primary     We discussed options for management including laparoscopic bilateral salpingoophorectomy and possible staging including pelvic aortic node dissection if cancer on frozen section on 11/11/18.  Dr Leonides Schanz from [facility] can assist in the surgery.    The risks of surgery were discussed in detail and she understands these to include infection; cervical incompetence; failure to remove the entire affected area; recurrence of dysplasia; injury to adjacent organs such as bowel, bladder, blood vessels, ureters, vagina, and nerves; bleeding which may require blood transfusion; anesthesia risk; thromboembolic events; possible death; unforeseen complications; possible need for further procedures; medical complications such as heart attack, stroke, pleural effusion and pneumonia.  The patient will receive DVT and antibiotic prophylaxis as indicated.  She voiced a clear understanding.  She had the opportunity to ask questions and written informed consent was obtained today.   The patient's diagnosis, an outline of the further diagnostic and laboratory studies which will be required, the recommendation for surgery, and alternatives were discussed with her and her accompanying family members.  All questions were answered to their satisfaction.  A total of 40 minutes were spent with the patient/family today; 50% was spent in education, counseling and coordination of care for pelvic mass.    [name], MD    CC:  Schermerhorn, Gwen Her, MD [address] [facility] [place], [place] [address] [phone]

## 2018-11-04 ENCOUNTER — Other Ambulatory Visit: Payer: Self-pay

## 2018-11-04 ENCOUNTER — Other Ambulatory Visit: Payer: Self-pay | Admitting: Family

## 2018-11-04 ENCOUNTER — Encounter
Admission: RE | Admit: 2018-11-04 | Discharge: 2018-11-04 | Disposition: A | Discharge status: Home / Self Care | Payer: BLUE CROSS/BLUE SHIELD | Source: Ambulatory Visit | Attending: Obstetrics and Gynecology | Admitting: Obstetrics and Gynecology

## 2018-11-04 DIAGNOSIS — E119 Type 2 diabetes mellitus without complications: Secondary | ICD-10-CM | POA: Diagnosis not present

## 2018-11-04 DIAGNOSIS — I10 Essential (primary) hypertension: Secondary | ICD-10-CM | POA: Diagnosis not present

## 2018-11-04 DIAGNOSIS — Z01818 Encounter for other preprocedural examination: Secondary | ICD-10-CM | POA: Diagnosis not present

## 2018-11-04 HISTORY — DX: Gastro-esophageal reflux disease without esophagitis: K21.9

## 2018-11-04 HISTORY — DX: Vitamin D deficiency, unspecified: E55.9

## 2018-11-04 HISTORY — DX: Thyrotoxicosis, unspecified without thyrotoxic crisis or storm: E05.90

## 2018-11-04 HISTORY — DX: Nontoxic single thyroid nodule: E04.1

## 2018-11-04 LAB — CBC WITH DIFFERENTIAL/PLATELET
Abs Immature Granulocytes: 0.02 10*3/uL (ref 0.00–0.07)
BASOS PCT: 1 %
Basophils Absolute: 0.1 10*3/uL (ref 0.0–0.1)
Eosinophils Absolute: 0.1 10*3/uL (ref 0.0–0.5)
Eosinophils Relative: 2 %
HCT: 41.2 % (ref 36.0–46.0)
Hemoglobin: 12.9 g/dL (ref 12.0–15.0)
Immature Granulocytes: 0 %
Lymphocytes Relative: 36 %
Lymphs Abs: 2.4 10*3/uL (ref 0.7–4.0)
MCH: 29.6 pg (ref 26.0–34.0)
MCHC: 31.3 g/dL (ref 30.0–36.0)
MCV: 94.5 fL (ref 80.0–100.0)
MONOS PCT: 8 %
Monocytes Absolute: 0.5 10*3/uL (ref 0.1–1.0)
Neutro Abs: 3.4 10*3/uL (ref 1.7–7.7)
Neutrophils Relative %: 53 %
Platelets: 359 10*3/uL (ref 150–400)
RBC: 4.36 MIL/uL (ref 3.87–5.11)
RDW: 13.6 % (ref 11.5–15.5)
WBC: 6.5 10*3/uL (ref 4.0–10.5)
nRBC: 0 % (ref 0.0–0.2)

## 2018-11-04 LAB — COMPREHENSIVE METABOLIC PANEL
ALT: 11 U/L (ref 0–44)
AST: 18 U/L (ref 15–41)
Albumin: 3.8 g/dL (ref 3.5–5.0)
Alkaline Phosphatase: 63 U/L (ref 38–126)
Anion gap: 9 (ref 5–15)
BUN: 9 mg/dL (ref 6–20)
CHLORIDE: 102 mmol/L (ref 98–111)
CO2: 28 mmol/L (ref 22–32)
CREATININE: 0.52 mg/dL (ref 0.44–1.00)
Calcium: 9.4 mg/dL (ref 8.9–10.3)
GFR calc Af Amer: >60 mL/min (ref >60)
GFR calc non Af Amer: >60 mL/min (ref >60)
Glucose, Bld: 108 mg/dL — ABNORMAL HIGH (ref 70–99)
Potassium: 4 mmol/L (ref 3.5–5.1)
Sodium: 139 mmol/L (ref 135–145)
Total Bilirubin: 0.7 mg/dL (ref 0.3–1.2)
Total Protein: 7.6 g/dL (ref 6.5–8.1)

## 2018-11-04 LAB — TYPE AND SCREEN
ABO/RH(D): B POS
Antibody Screen: NEGATIVE

## 2018-11-04 LAB — PROTIME-INR
INR: 0.97
Prothrombin Time: 12.8 seconds (ref 11.4–15.2)

## 2018-11-04 LAB — HEMOGLOBIN A1C
Hgb A1c MFr Bld: 6.5 % — ABNORMAL HIGH (ref 4.8–5.6)
Mean Plasma Glucose: 139.85 mg/dL

## 2018-11-04 LAB — APTT: aPTT: 31 seconds (ref 24–36)

## 2018-11-04 NOTE — Patient Instructions (Addendum)
Your procedure is scheduled on:  Wednesday, November 11, 2018 Report to Day Surgery on the 2nd floor of the [facility]. To find out your arrival time, please call [phone] between 1PM - 3PM on: Tuesday, November 10, 2018  REMEMBER: Instructions that are not followed completely may result in serious medical risk, up to and including death; or upon the discretion of your surgeon and anesthesiologist your surgery may need to be rescheduled.  Do not eat food after midnight the night before surgery.  No gum chewing, lozengers or hard candies.  You may however, drink water up to 2 hours before you are scheduled to arrive for your surgery. Do not drink anything within 2 hours of the start of your surgery.  No Alcohol for 24 hours before or after surgery.  No Smoking including e-cigarettes for 24 hours prior to surgery.  No chewable tobacco products for at least 6 hours prior to surgery.  No nicotine patches on the day of surgery.  On the morning of surgery brush your teeth with toothpaste and water, you may rinse your mouth with mouthwash if you wish. Do not swallow any toothpaste or mouthwash.  Notify your doctor if there is any change in your medical condition (cold, fever, infection).  Do not wear jewelry, make-up, hairpins, clips or nail polish.  Do not wear lotions, powders, or perfumes.   Do not shave 48 hours prior to surgery.   Contacts and dentures may not be worn into surgery.  Do not bring valuables to the hospital, including drivers license, insurance or credit cards.  [facility] is not responsible for any belongings or valuables.   TAKE THESE MEDICATIONS THE MORNING OF SURGERY:  1.  Amlodipine 2.  Gabapentin 3.  Duoneb nebulizer 4.  Metoprolol 5.  Omeprazole - (take one the night before and one on the morning of surgery - helps to prevent nausea after surgery.)  FLEETS ENEMA AS DIRECTED.  Use CHG Soap as directed on instruction sheet.  NOW!  Stop  Anti-inflammatories (NSAIDS) such as Advil, Aleve, Ibuprofen, Motrin, Naproxen, Naprosyn and Aspirin based products such as Excedrin, Goodys Powder, BC Powder. (May take Tylenol or Acetaminophen if needed.)  NOW!  Stop ANY OVER THE COUNTER supplements until after surgery. (May continue Vitamin D.)  Wear comfortable clothing (specific to your surgery type) to the hospital.  Plan for stool softeners for home use.  If you are being admitted to the hospital overnight, leave your suitcase in the car. After surgery it may be brought to your room.  If you are being discharged the day of surgery, you will not be allowed to drive home. You will need a responsible adult to drive you home and stay with you that night.   If you are taking public transportation, you will need to have a responsible adult with you. Please confirm with your physician that it is acceptable to use public transportation.   Please call [phone] if you have any questions about these instructions.

## 2018-11-06 ENCOUNTER — Ambulatory Visit: Payer: BLUE CROSS/BLUE SHIELD | Admitting: Family

## 2018-11-06 DIAGNOSIS — Z0289 Encounter for other administrative examinations: Secondary | ICD-10-CM

## 2018-11-10 ENCOUNTER — Telehealth: Payer: Self-pay

## 2018-11-10 ENCOUNTER — Encounter: Payer: Self-pay | Admitting: Anesthesiology

## 2018-11-10 NOTE — Telephone Encounter (Signed)
Copied from [place] [id number]. Topic: General - Other >> Nov 10, 2018  8:22 AM Yvette Rack wrote: Reason for CRM: pt calling stating that she is having surgery tomorrow to remove both her ovaries

## 2018-11-11 ENCOUNTER — Encounter
Admission: RE | Disposition: A | Discharge status: Home / Self Care | Payer: Self-pay | Source: Home / Self Care | Attending: Obstetrics and Gynecology

## 2018-11-11 ENCOUNTER — Ambulatory Visit: Payer: BLUE CROSS/BLUE SHIELD | Admitting: Anesthesiology

## 2018-11-11 ENCOUNTER — Encounter: Payer: Self-pay | Admitting: *Deleted

## 2018-11-11 ENCOUNTER — Ambulatory Visit
Admission: RE | Admit: 2018-11-11 | Discharge: 2018-11-11 | Disposition: A | Discharge status: Home / Self Care | Payer: BLUE CROSS/BLUE SHIELD | Attending: Obstetrics and Gynecology | Admitting: Obstetrics and Gynecology

## 2018-11-11 ENCOUNTER — Ambulatory Visit: Payer: BLUE CROSS/BLUE SHIELD

## 2018-11-11 ENCOUNTER — Other Ambulatory Visit: Payer: Self-pay

## 2018-11-11 DIAGNOSIS — K219 Gastro-esophageal reflux disease without esophagitis: Secondary | ICD-10-CM | POA: Insufficient documentation

## 2018-11-11 DIAGNOSIS — E119 Type 2 diabetes mellitus without complications: Secondary | ICD-10-CM | POA: Insufficient documentation

## 2018-11-11 DIAGNOSIS — Z7951 Long term (current) use of inhaled steroids: Secondary | ICD-10-CM | POA: Insufficient documentation

## 2018-11-11 DIAGNOSIS — J309 Allergic rhinitis, unspecified: Secondary | ICD-10-CM | POA: Insufficient documentation

## 2018-11-11 DIAGNOSIS — Z791 Long term (current) use of non-steroidal anti-inflammatories (NSAID): Secondary | ICD-10-CM | POA: Diagnosis not present

## 2018-11-11 DIAGNOSIS — N838 Other noninflammatory disorders of ovary, fallopian tube and broad ligament: Secondary | ICD-10-CM | POA: Diagnosis not present

## 2018-11-11 DIAGNOSIS — N83511 Torsion of right ovary and ovarian pedicle: Secondary | ICD-10-CM | POA: Insufficient documentation

## 2018-11-11 DIAGNOSIS — E559 Vitamin D deficiency, unspecified: Secondary | ICD-10-CM | POA: Diagnosis not present

## 2018-11-11 DIAGNOSIS — M069 Rheumatoid arthritis, unspecified: Secondary | ICD-10-CM | POA: Insufficient documentation

## 2018-11-11 DIAGNOSIS — J449 Chronic obstructive pulmonary disease, unspecified: Secondary | ICD-10-CM | POA: Insufficient documentation

## 2018-11-11 DIAGNOSIS — I10 Essential (primary) hypertension: Secondary | ICD-10-CM | POA: Diagnosis not present

## 2018-11-11 DIAGNOSIS — Z79899 Other long term (current) drug therapy: Secondary | ICD-10-CM | POA: Insufficient documentation

## 2018-11-11 DIAGNOSIS — N736 Female pelvic peritoneal adhesions (postinfective): Secondary | ICD-10-CM | POA: Diagnosis not present

## 2018-11-11 DIAGNOSIS — E785 Hyperlipidemia, unspecified: Secondary | ICD-10-CM | POA: Insufficient documentation

## 2018-11-11 DIAGNOSIS — R1900 Intra-abdominal and pelvic swelling, mass and lump, unspecified site: Secondary | ICD-10-CM | POA: Diagnosis present

## 2018-11-11 DIAGNOSIS — I700 Atherosclerosis of aorta: Secondary | ICD-10-CM | POA: Insufficient documentation

## 2018-11-11 DIAGNOSIS — N83201 Unspecified ovarian cyst, right side: Secondary | ICD-10-CM | POA: Insufficient documentation

## 2018-11-11 DIAGNOSIS — R0902 Hypoxemia: Secondary | ICD-10-CM

## 2018-11-11 DIAGNOSIS — J9601 Acute respiratory failure with hypoxia: Secondary | ICD-10-CM | POA: Insufficient documentation

## 2018-11-11 DIAGNOSIS — Z87891 Personal history of nicotine dependence: Secondary | ICD-10-CM | POA: Diagnosis not present

## 2018-11-11 HISTORY — PX: LAPAROSCOPIC BILATERAL SALPINGO OOPHERECTOMY: SHX5890

## 2018-11-11 LAB — GLUCOSE, CAPILLARY
Glucose-Capillary: 78 mg/dL (ref 70–99)
Glucose-Capillary: 91 mg/dL (ref 70–99)

## 2018-11-11 LAB — ABO/RH: ABO/RH(D): B POS

## 2018-11-11 SURGERY — SALPINGO-OOPHORECTOMY, BILATERAL, LAPAROSCOPIC
Anesthesia: General | Laterality: Bilateral

## 2018-11-11 MED ORDER — MIDAZOLAM HCL 2 MG/2ML IJ SOLN
INTRAMUSCULAR | Status: AC
  Filled 2018-11-11: qty 2

## 2018-11-11 MED ORDER — OXYCODONE HCL 5 MG PO CAPS: 5.0000 mg | ORAL_CAPSULE | Freq: Four times a day (QID) | ORAL | 0 refills | Status: DC | PRN

## 2018-11-11 MED ORDER — FENTANYL CITRATE (PF) 100 MCG/2ML IJ SOLN
INTRAMUSCULAR | Status: AC
  Filled 2018-11-11: qty 2

## 2018-11-11 MED ORDER — FLEET ENEMA 7-19 GM/118ML RE ENEM: 1.0000 | ENEMA | Freq: Once | RECTAL | Status: DC

## 2018-11-11 MED ORDER — PHENYLEPHRINE HCL 10 MG/ML IJ SOLN
INTRAMUSCULAR | Status: DC | PRN
  Administered 2018-11-11: 200 ug via INTRAVENOUS

## 2018-11-11 MED ORDER — LABETALOL HCL 5 MG/ML IV SOLN
INTRAVENOUS | Status: AC
  Filled 2018-11-11: qty 4

## 2018-11-11 MED ORDER — LABETALOL HCL 5 MG/ML IV SOLN
10.0000 mg | INTRAVENOUS | Status: DC | PRN
  Administered 2018-11-11: 10 mg via INTRAVENOUS

## 2018-11-11 MED ORDER — FENTANYL CITRATE (PF) 100 MCG/2ML IJ SOLN
INTRAMUSCULAR | Status: DC | PRN
  Administered 2018-11-11 (×6): 50 ug via INTRAVENOUS

## 2018-11-11 MED ORDER — SODIUM CHLORIDE 0.9 % IV SOLN
INTRAVENOUS | Status: DC
  Administered 2018-11-11: 07:00:00 via INTRAVENOUS

## 2018-11-11 MED ORDER — MIDAZOLAM HCL 2 MG/2ML IJ SOLN
INTRAMUSCULAR | Status: DC | PRN
  Administered 2018-11-11: 2 mg via INTRAVENOUS

## 2018-11-11 MED ORDER — FENTANYL CITRATE (PF) 100 MCG/2ML IJ SOLN
25.0000 ug | INTRAMUSCULAR | Status: DC | PRN
  Administered 2018-11-11 (×4): 25 ug via INTRAVENOUS

## 2018-11-11 MED ORDER — ACETAMINOPHEN 500 MG PO TABS
ORAL_TABLET | ORAL | Status: AC
  Administered 2018-11-11: 1000 mg via ORAL
  Filled 2018-11-11: qty 2

## 2018-11-11 MED ORDER — LIDOCAINE HCL (CARDIAC) PF 100 MG/5ML IV SOSY
PREFILLED_SYRINGE | INTRAVENOUS | Status: DC | PRN
  Administered 2018-11-11: 80 mg via INTRAVENOUS

## 2018-11-11 MED ORDER — DEXAMETHASONE SODIUM PHOSPHATE 10 MG/ML IJ SOLN
INTRAMUSCULAR | Status: DC | PRN
  Administered 2018-11-11: 10 mg via INTRAVENOUS

## 2018-11-11 MED ORDER — ACETAMINOPHEN 500 MG PO TABS: 1000.0000 mg | ORAL_TABLET | Freq: Four times a day (QID) | ORAL | 0 refills | Status: AC

## 2018-11-11 MED ORDER — LABETALOL HCL 5 MG/ML IV SOLN
INTRAVENOUS | Status: DC | PRN
  Administered 2018-11-11 (×2): 5 mg via INTRAVENOUS

## 2018-11-11 MED ORDER — DEXAMETHASONE SODIUM PHOSPHATE 10 MG/ML IJ SOLN
INTRAMUSCULAR | Status: AC
  Filled 2018-11-11: qty 1

## 2018-11-11 MED ORDER — BUPIVACAINE HCL (PF) 0.5 % IJ SOLN
INTRAMUSCULAR | Status: AC
  Filled 2018-11-11: qty 30

## 2018-11-11 MED ORDER — HEPARIN SODIUM (PORCINE) 5000 UNIT/ML IJ SOLN
INTRAMUSCULAR | Status: AC
  Administered 2018-11-11: 5000 [IU] via SUBCUTANEOUS
  Filled 2018-11-11: qty 1

## 2018-11-11 MED ORDER — BUPIVACAINE HCL (PF) 0.5 % IJ SOLN
INTRAMUSCULAR | Status: DC | PRN
  Administered 2018-11-11: 8 mL

## 2018-11-11 MED ORDER — HEPARIN SODIUM (PORCINE) 5000 UNIT/ML IJ SOLN
5000.0000 [IU] | Freq: Once | INTRAMUSCULAR | Status: AC
  Administered 2018-11-11: 5000 [IU] via SUBCUTANEOUS

## 2018-11-11 MED ORDER — LACTATED RINGERS IV SOLN
INTRAVENOUS | Status: DC | PRN
  Administered 2018-11-11: 08:00:00 via INTRAVENOUS

## 2018-11-11 MED ORDER — DEXAMETHASONE SODIUM PHOSPHATE 10 MG/ML IJ SOLN
8.0000 mg | Freq: Once | INTRAMUSCULAR | Status: AC
  Administered 2018-11-11: 8 mg via INTRAVENOUS

## 2018-11-11 MED ORDER — ROCURONIUM BROMIDE 50 MG/5ML IV SOLN
INTRAVENOUS | Status: AC
  Filled 2018-11-11: qty 1

## 2018-11-11 MED ORDER — CHLORHEXIDINE GLUCONATE CLOTH 2 % EX PADS: 6.0000 | MEDICATED_PAD | Freq: Once | CUTANEOUS | Status: DC

## 2018-11-11 MED ORDER — ROCURONIUM BROMIDE 100 MG/10ML IV SOLN
INTRAVENOUS | Status: DC | PRN
  Administered 2018-11-11: 50 mg via INTRAVENOUS

## 2018-11-11 MED ORDER — EPHEDRINE SULFATE 50 MG/ML IJ SOLN
INTRAMUSCULAR | Status: AC
  Filled 2018-11-11: qty 1

## 2018-11-11 MED ORDER — ONDANSETRON HCL 4 MG/2ML IJ SOLN: 4.0000 mg | Freq: Once | INTRAMUSCULAR | Status: DC | PRN

## 2018-11-11 MED ORDER — ACETAMINOPHEN 500 MG PO TABS
1000.0000 mg | ORAL_TABLET | ORAL | Status: AC
  Administered 2018-11-11: 1000 mg via ORAL

## 2018-11-11 MED ORDER — ONDANSETRON HCL 4 MG/2ML IJ SOLN
INTRAMUSCULAR | Status: DC | PRN
  Administered 2018-11-11: 4 mg via INTRAVENOUS

## 2018-11-11 MED ORDER — PHENYLEPHRINE HCL 10 MG/ML IJ SOLN
INTRAMUSCULAR | Status: AC
  Filled 2018-11-11: qty 1

## 2018-11-11 MED ORDER — RACEPINEPHRINE HCL 2.25 % IN NEBU
INHALATION_SOLUTION | RESPIRATORY_TRACT | Status: AC
  Administered 2018-11-11: 0.5 mL
  Filled 2018-11-11: qty 0.5

## 2018-11-11 MED ORDER — RACEPINEPHRINE HCL 2.25 % IN NEBU: 0.5000 mL | INHALATION_SOLUTION | Freq: Once | RESPIRATORY_TRACT | Status: DC

## 2018-11-11 MED ORDER — LIDOCAINE HCL (PF) 2 % IJ SOLN
INTRAMUSCULAR | Status: AC
  Filled 2018-11-11: qty 10

## 2018-11-11 MED ORDER — ONDANSETRON HCL 4 MG/2ML IJ SOLN
INTRAMUSCULAR | Status: AC
  Filled 2018-11-11: qty 2

## 2018-11-11 MED ORDER — PROPOFOL 10 MG/ML IV BOLUS
INTRAVENOUS | Status: AC
  Filled 2018-11-11: qty 20

## 2018-11-11 MED ORDER — PROPOFOL 10 MG/ML IV BOLUS
INTRAVENOUS | Status: DC | PRN
  Administered 2018-11-11: 200 mg via INTRAVENOUS

## 2018-11-11 MED ORDER — INDOCYANINE GREEN 25 MG IV SOLR
INTRAVENOUS | Status: AC
  Filled 2018-11-11: qty 25

## 2018-11-11 MED ORDER — METHYLENE BLUE 0.5 % INJ SOLN
INTRAVENOUS | Status: AC
  Filled 2018-11-11: qty 10

## 2018-11-11 SURGICAL SUPPLY — 66 items
APPLICATOR SURGIFLO ENDO (HEMOSTASIS) IMPLANT
BAG URINE DRAINAGE (UROLOGICAL SUPPLIES) ×4 IMPLANT
BLADE SURG 15 STRL LF DISP TIS (BLADE) ×2 IMPLANT
BLADE SURG 15 STRL SS (BLADE) ×2
CANISTER SUCT 1200ML W/VALVE (MISCELLANEOUS) ×4 IMPLANT
CANNULA DILATOR  5MM W/SLV (CANNULA)
CANNULA DILATOR 5 W/SLV (CANNULA) IMPLANT
CATH FOLEY 2WAY  5CC 16FR (CATHETERS) ×2
CATH URTH 16FR FL 2W BLN LF (CATHETERS) ×2 IMPLANT
CHLORAPREP W/TINT 26ML (MISCELLANEOUS) ×4 IMPLANT
CNTNR SPEC 2.5X3XGRAD LEK (MISCELLANEOUS) ×4
CONT SPEC 4OZ STER OR WHT (MISCELLANEOUS) ×4
CONTAINER SPEC 2.5X3XGRAD LEK (MISCELLANEOUS) ×4 IMPLANT
CORD MONOPOLAR M/FML 12FT (MISCELLANEOUS) ×4 IMPLANT
COVER WAND RF STERILE (DRAPES) ×4 IMPLANT
DEFOGGER SCOPE WARMER CLEARIFY (MISCELLANEOUS) ×4 IMPLANT
DERMABOND ADVANCED (GAUZE/BANDAGES/DRESSINGS) ×2
DERMABOND ADVANCED .7 DNX12 (GAUZE/BANDAGES/DRESSINGS) ×2 IMPLANT
DEVICE SUTURE ENDOST 10MM (ENDOMECHANICALS) IMPLANT
DRAPE STERI POUCH LG 24X46 STR (DRAPES) ×8 IMPLANT
GLOVE BIO SURGEON STRL SZ8 (GLOVE) ×16 IMPLANT
GLOVE INDICATOR 8.0 STRL GRN (GLOVE) ×4 IMPLANT
GOWN STRL REUS W/ TWL LRG LVL3 (GOWN DISPOSABLE) ×4 IMPLANT
GOWN STRL REUS W/ TWL XL LVL3 (GOWN DISPOSABLE) ×2 IMPLANT
GOWN STRL REUS W/TWL LRG LVL3 (GOWN DISPOSABLE) ×4
GOWN STRL REUS W/TWL XL LVL3 (GOWN DISPOSABLE) ×2
GRASPER SUT TROCAR 14GX15 (MISCELLANEOUS) ×8 IMPLANT
IRRIGATION STRYKERFLOW (MISCELLANEOUS) ×2 IMPLANT
IRRIGATOR STRYKERFLOW (MISCELLANEOUS) ×4
IV LACTATED RINGERS 1000ML (IV SOLUTION) ×4 IMPLANT
KIT PINK PAD W/HEAD ARE REST (MISCELLANEOUS) ×4
KIT PINK PAD W/HEAD ARM REST (MISCELLANEOUS) ×2 IMPLANT
LABEL OR SOLS (LABEL) ×4 IMPLANT
LIGASURE VESSEL 5MM BLUNT TIP (ELECTROSURGICAL) ×4 IMPLANT
MANIPULATOR VCARE LG CRV RETR (MISCELLANEOUS) IMPLANT
MANIPULATOR VCARE SML CRV RETR (MISCELLANEOUS) IMPLANT
MANIPULATOR VCARE STD CRV RETR (MISCELLANEOUS) IMPLANT
NDL INSUFF ACCESS 14 VERSASTEP (NEEDLE) ×4 IMPLANT
NEEDLE SPNL 22GX5 LNG QUINC BK (NEEDLE) ×4 IMPLANT
NS IRRIG 500ML POUR BTL (IV SOLUTION) ×4 IMPLANT
OCCLUDER COLPOPNEUMO (BALLOONS) IMPLANT
PACK GYN LAPAROSCOPIC (MISCELLANEOUS) ×4 IMPLANT
PAD OB MATERNITY 4.3X12.25 (PERSONAL CARE ITEMS) ×4 IMPLANT
PAD PREP 24X41 OB/GYN DISP (PERSONAL CARE ITEMS) ×4 IMPLANT
PORT ACCESS TROCAR AIRSEAL 5 (TROCAR) ×4 IMPLANT
POUCH ENDO CATCH 10MM SPEC (MISCELLANEOUS) ×4 IMPLANT
POUCH ENDO CATCH II 15MM (MISCELLANEOUS) IMPLANT
SET CYSTO W/LG BORE CLAMP LF (SET/KITS/TRAYS/PACK) ×4 IMPLANT
SET TRI-LUMEN FLTR TB AIRSEAL (TUBING) ×4 IMPLANT
SHEARS ENDO 5MM 31CM (CUTTER) ×4 IMPLANT
SPOGE SURGIFLO 8M (HEMOSTASIS)
SPONGE LAP 4X18 RFD (DISPOSABLE) IMPLANT
SPONGE SURGIFLO 8M (HEMOSTASIS) IMPLANT
SUT ENDO VLOC 180-0-8IN (SUTURE) IMPLANT
SUT MNCRL 4-0 (SUTURE) ×4
SUT MNCRL 4-0 27XMFL (SUTURE) ×4
SUT VIC AB 0 CT1 36 (SUTURE) ×4 IMPLANT
SUT VIC AB 2-0 UR6 27 (SUTURE) ×4 IMPLANT
SUTURE MNCRL 4-0 27XMF (SUTURE) ×4 IMPLANT
SYR 10ML LL (SYRINGE) ×4 IMPLANT
SYR 3ML LL SCALE MARK (SYRINGE) ×8 IMPLANT
SYR 50ML LL SCALE MARK (SYRINGE) ×8 IMPLANT
TROCAR BLUNT TIP 12MM OMST12BT (TROCAR) ×4 IMPLANT
TROCAR VERSASTEP PLUS 12MM (TROCAR) IMPLANT
TROCAR VERSASTEP PLUS 5MM (TROCAR) ×4 IMPLANT
TUBING EVAC SMOKE HEATED PNEUM (TUBING) IMPLANT

## 2018-11-11 NOTE — Anesthesia Procedure Notes (Signed)
Procedure Name: Intubation Date/Time: 11/11/2018 7:42 AM Performed by: Philbert Riser, CRNA Pre-anesthesia Checklist: Patient identified, Emergency Drugs available, Suction available, Patient being monitored and Timeout performed Patient Re-evaluated:Patient Re-evaluated prior to induction Oxygen Delivery Method: Circle system utilized and Simple face mask Preoxygenation: Pre-oxygenation with 100% oxygen Induction Type: IV induction Ventilation: Mask ventilation without difficulty Laryngoscope Size: Mac and 3 Grade View: Grade I Tube type: Oral Tube size: 7.0 mm Number of attempts: 1 Airway Equipment and Method: Stylet Placement Confirmation: ETT inserted through vocal cords under direct vision,  positive ETCO2 and breath sounds checked- equal and bilateral Secured at: 22 cm Tube secured with: Tape Dental Injury: Teeth and Oropharynx as per pre-operative assessment

## 2018-11-11 NOTE — Interval H&P Note (Signed)
History and Physical Interval Note:  11/11/2018 7:00 AM  Brooke Murphy  has presented today for surgery, with the diagnosis of RIGHT PELVIC MASS, PAIN  The various methods of treatment have been discussed with the patient and family. After consideration of risks, benefits and other options for treatment, the patient has consented to  Procedure(s): LAPAROSCOPIC REMOVAL OF RIGHT PELVIC MASS, REMOVAL OF BILATERAL SALPINGO OOPHORECTOMY, POSSIBLE STAGING WITH PELVIC/AOTIC LYMPH NODES AND OMENTECTOMY, AND POSSIBLE REMOVAL OF CERVIX (Bilateral) as a surgical intervention .  The patient's history has been reviewed, patient examined, no change in status, stable for surgery.  I have reviewed the patient's chart and labs.  Questions were answered to the patient's satisfaction.     [name]

## 2018-11-11 NOTE — Op Note (Signed)
  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  CONDITION: stable  INDICATIONS: painful right ovarian mass of about 8 cm.   FINDINGS: 8 cm right ovarian mass adherent to sigmoid mesentery and pelvic sidewall.  Looked chronically torsed. Left tube and ovary normal.  Upper abdomen showed no abnormalities.     PROCEDURE IN DETAIL: After informed consent was obtained, the patient was taken to the operating room where anesthesia was obtained without difficulty. The patient was positioned in the dorsal lithotomy position in [place] and her arms were carefully tucked at her sides and the usual precautions were taken.  She was prepped and draped in normal sterile fashion.  Time-out was performed and a Foley catheter was placed into the bladder.    An open Hasson technique was used to place an infraumbilical 17-EY baloon trocar under direct visualization. The laparoscope was introduced and CO2 gas was infused for pneumoperitoneum to a pressure of 15 mm Hg.  Right and left (Airseal) lateral 5-mm ports were placed under direct visualization of the laparoscope.  Cytologic washings were obtained.  The patient was placed in Trendelenburg and the bowel was displaced up into the upper abdomen.  The right ureter were identified and preserved.  The right infundibulopelvic ligament was sealed and divided with the LigaSure device and attachements of the ovarian mass to the colon mesentery and PSW were taken down with blunt and  sharp dissection.. The mass disintegrated easily and was noted to have some dead looking tissue inside. Frozen section was benign.    Hemostasis was observed. The intraperitoneal pressure was dropped, and all planes of dissection, vascular pedicles and the vaginal cuff were found to be hemostatic.  The lateral trocars were removed under visualization after closure with the endoclose.  Before the umbilical trocar was removed the CO2 gas was released.  The fascia there was closed with 0 Vicryl suture in interrupted technique.   The skin incisions were closed with subcuticular stitches and glue.  The patient tolerated the procedure well.  Sponge, lap and needle counts were correct x2.  The patient was taken to recovery room in excellent condition.  Antibiotics: not indicated   VTE prophylaxis: IPCs and minidose heparin ordered perioperatively.  [name], MD

## 2018-11-11 NOTE — Consult Note (Signed)
Medical Consultation  Brooke Murphy YIR:[id number] DOB: May 31, 1961 DOA: 11/11/2018 PCP: Burnard Hawthorne, FNP   Requesting physician: dr Marcello Moores Date of consultation: 11/11/2018 Reason for consultation: hypoxia  Impression/Recommendations  58 year old female with history of hypertension, hyperlipidemia and diabetes postoperative day #0 elective surgery for pelvic mass who was found to have hypoxia after procedure.  1.  Acute hypoxic respiratory failure: At this time patient is saturating 98% on room air.  She did receive racemic epinephrine. Suggest checking x-ray Continue to monitor patient and if she shows signs of more hypoxia than patient will need to be admitted. Suspect some of this is still the anesthesia wearing off  2.  Essential hypertension: Patient will continue on metoprolol, Norvasc and losartan 3.  Diabetes ADA diet with outpatient regimen 4.  Hyperlipidemia: Continue statin 5.  Postoperative day #0 for right pelvic mass: Patient status post laparoscopic removal right pelvic mass, removal bilateral SALPINGO OOPHORECTOMY Management as per OB/GYN   Chief Complaint:  Postoperative day #0 status post elective surgery as per above  HPI:    58 year old female with history of hypertension, hyperlipidemia and diabetes who presented to outpatient surgical services for right pelvic mass removal surgery.  Patient was doing well postoperatively however at some point her oxygen saturation decreased in the 70s and she had some stridor as per nursing and anesthesiologist.  She was given racemic epi.  This happened one more time since she has been observed in the PACU.  Hospitalist was consulted for this.  I have asked the team to order chest x-ray.  She is currently saturating 90% on room air and without any symptoms.  She is still coming off of her anesthesia and is not fully awake and alert.  Review of Systems  Unable to obtain as patient is not fully awake and alert yet status post  anesthesia  Past Medical History:  Diagnosis Date  . Brain aneurysm 1999   [facility]  . Diabetes mellitus without complication (HCC)    diet controlled  . GERD (gastroesophageal reflux disease)   . Hyperlipidemia   . Hypertension   . Hyperthyroidism   . Rheumatoid arthritis ([place]) 2002   s/p methotrexate, humira and embrel  . Thyroid nodule   . Ulcer of abdomen wall ([place])   . Vitamin D deficiency    Past Surgical History:  Procedure Laterality Date  . ABDOMINAL HYSTERECTOMY  2005   ovaries remain; WITH CERVIX  . BRAIN SURGERY Left 11/27/1997   aneurysm  . COLONOSCOPY     Social History:  reports that she quit smoking about 2 months ago. Her smoking use included cigarettes. She has a 33.30 pack-year smoking history. She has never used smokeless tobacco. She reports that she does not drink alcohol or use drugs.  Allergies  Allergen Reactions  . Celebrex [Celecoxib] Rash    Rash. Upset stomach  . Lodine [Etodolac] Rash    Rash. Upset stomach  . Penicillins Rash    Has patient had a PCN reaction causing immediate rash, facial/tongue/throat swelling, SOB or lightheadedness with hypotension: Yes Has patient had a PCN reaction causing severe rash involving mucus membranes or skin necrosis: No Has patient had a PCN reaction that required hospitalization: No Has patient had a PCN reaction occurring within the last 10 years: No If all of the above answers are "NO", then may proceed with Cephalosporin use.   Family History  Problem Relation Age of Onset  . Hypertension Mother   . Aneurysm Mother 11  died  . Hyperlipidemia Sister   . Hypertension Sister   . Cancer Father 70       throat cancer  . Diabetes Maternal Aunt   . Breast cancer Maternal Aunt 65  . Hypertension Maternal Grandmother   . Stroke Maternal Grandmother   . Colon cancer Neg Hx     Prior to Admission medications   Medication Sig Start Date End Date Taking? Authorizing Provider  amLODipine (NORVASC) 5 MG  tablet Take 1 tablet (5 mg total) by mouth daily. 09/28/18  Yes Burnard Hawthorne, FNP  atorvastatin (LIPITOR) 20 MG tablet Take 1 tablet (20 mg total) by mouth daily. 09/11/18  Yes Arnett, Yvetta Coder, FNP  benzonatate (TESSALON) 200 MG capsule Take 200 mg by mouth 2 (two) times daily as needed for cough.  09/20/18  Yes [provider]  bimatoprost (LUMIGAN) 0.01 % SOLN Place 1 drop into both eyes at bedtime.   Yes [provider]  diclofenac sodium (VOLTAREN) 1 % GEL Apply 4 g topically 4 (four) times daily. Patient taking differently: Apply 4 g topically 4 (four) times daily as needed (joint pain).  07/20/18  Yes Arnett, Yvetta Coder, FNP  docusate sodium (COLACE) 100 MG capsule Take 100 mg by mouth 2 (two) times daily.   Yes [provider]  fluticasone (FLONASE) 50 MCG/ACT nasal spray Place 2 sprays into both nostrils daily. Patient taking differently: Place 2 sprays into both nostrils daily as needed for allergies.  04/26/14  Yes Rey, Raquel M, NP  gabapentin (NEURONTIN) 100 MG capsule TAKE 1 CAPSULE (100 MG TOTAL) BY MOUTH 3 (THREE) TIMES DAILY AS NEEDED (FOR HEADACHE/NERVE PAIN). 08/12/18  Yes Arnett, Yvetta Coder, FNP  ipratropium-albuterol (DUONEB) 0.5-2.5 (3) MG/3ML SOLN Take 3 mLs by nebulization every 4 (four) hours as needed. 09/20/18  Yes Fisher, Linden Dolin, PA-C  lidocaine (LIDODERM) 5 % Place 1 patch onto the skin daily. Remove & Discard patch within 12 hours. Patient taking differently: Place 1 patch onto the skin daily as needed (pain). Remove & Discard patch within 12 hours. 07/20/18  Yes Burnard Hawthorne, FNP  losartan (COZAAR) 100 MG tablet Take 1 tablet (100 mg total) by mouth daily. 09/11/18  Yes Arnett, Yvetta Coder, FNP  metoprolol tartrate (LOPRESSOR) 25 MG tablet TAKE 1 TABLET (25 MG TOTAL) BY MOUTH 2 (TWO) TIMES DAILY. Patient taking differently: Take 25 mg by mouth 2 (two) times daily. TAKE 1 TABLET (25 MG TOTAL) BY MOUTH 2 (TWO) TIMES DAILY. 08/07/18  Yes  Arnett, Yvetta Coder, FNP  mirtazapine (REMERON) 30 MG tablet Take 1 tablet (30 mg total) by mouth at bedtime. 09/11/18  Yes Arnett, Yvetta Coder, FNP  montelukast (SINGULAIR) 10 MG tablet TAKE 1 TABLET BY MOUTH EVERYDAY AT BEDTIME Patient taking differently: Take 10 mg by mouth at bedtime. TAKE 1 TABLET BY MOUTH EVERYDAY AT BEDTIME 09/11/18  Yes Arnett, Yvetta Coder, FNP  omeprazole (PRILOSEC) 20 MG capsule Take 1 capsule (20 mg total) by mouth daily. 08/07/18  Yes Burnard Hawthorne, FNP  psyllium (METAMUCIL) 58.6 % powder Take 1 packet by mouth daily as needed.    Yes [provider]  Vitamin D, Ergocalciferol, (DRISDOL) 1.25 MG (50000 UT) CAPS capsule Take 50,000 Units by mouth every 7 (seven) days.  09/30/18  Yes [provider]  glucose blood (ONETOUCH VERIO) test strip Used to check blood sugar once daily. 10/05/18   Burnard Hawthorne, FNP  Lancets [facility] ULTRASOFT) lancets Use as instructed 10/05/18   Arnett,  Yvetta Coder, FNP    Physical Exam: Blood pressure (!) 157/95, pulse 73, temperature (!) 97 F (36.1 C), resp. rate 13, height 5' 5.75" (1.67 m), weight 61.7 kg, SpO2 98 %. @VITALS2 @ [employer]   11/11/18 0622  Weight: 61.7 kg    Intake/Output Summary (Last 24 hours) at 11/11/2018 1056 Last data filed at 11/11/2018 0950 Gross per 24 hour  Intake -  Output 210 ml  Net -210 ml     Constitutional: Appears well-developed and well-nourished..sitting in pacu bed no distress HENT: Normocephalic. Marland Kitchen Oropharynx is clear and moist.  Eyes: Conjunctivae and EOM are normal. PERRLA, no scleral icterus.  Neck: Normal ROM. Neck supple. No JVD. No tracheal deviation. CVS: RRR, S1/S2 +, no murmurs, no gallops, no carotid bruit.  Pulmonary: Effort and breath sounds normal, no stridor, rhonchi, wheezes, rales.  Abdominal: Soft. BS +,  no distension, tenderness, rebound or guarding.  Musculoskeletal:. No edema and no tenderness.  Neuro: No obvious focal deficits. Skin: Skin is  warm and dry. No rash noted. Psychiatric: Responds to verbal cues however not fully awake and alert  Labs  Basic Metabolic Panel: Recent Labs  Lab 11/04/18 1230  NA 139  K 4.0  CL 102  CO2 28  GLUCOSE 108*  BUN 9  CREATININE 0.52  CALCIUM 9.4   Liver Function Tests: Recent Labs  Lab 11/04/18 1230  AST 18  ALT 11  ALKPHOS 63  BILITOT 0.7  PROT 7.6  ALBUMIN 3.8   No results for input(s): LIPASE, AMYLASE in the last 168 hours.  CBC: Recent Labs  Lab 11/04/18 1230  WBC 6.5  NEUTROABS 3.4  HGB 12.9  HCT 41.2  MCV 94.5  PLT 359   Cardiac Enzymes: No results for input(s): CKTOTAL, CKMB, CKMBINDEX, TROPONINI in the last 168 hours. BNP: Invalid input(s): POCBNP CBG: Recent Labs  Lab 11/11/18 0948  GLUCAP 91    Radiological Exams: No results found.     Thank you for allowing me to participate in the care of your patient.  I will follow-up with the anesthesiologist and chest x-ray later this afternoon.  Note: This dictation was prepared with Dragon dictation along with smaller phrase technology. Any transcriptional errors that result from this process are unintentional.  Time spent: 40 minutes  Danette Weinfeld, MD

## 2018-11-11 NOTE — Transfer of Care (Signed)
Immediate Anesthesia Transfer of Care Note  Patient: Brooke Murphy  Procedure(s) Performed: LAPAROSCOPIC BILATERAL SALPINGO OOPHORECTOMY  Patient Location: PACU  Anesthesia Type:General  Level of Consciousness: awake and alert   Airway & Oxygen Therapy: Patient Spontanous Breathing and Patient connected to face mask oxygen  Post-op Assessment: Report given to RN and Post -op Vital signs reviewed and stable  Post vital signs: Reviewed and stable  Last Vitals:  Vitals Value Taken Time  BP 155/113 11/11/2018  9:46 AM  Temp    Pulse 79 11/11/2018  9:48 AM  Resp 12 11/11/2018  9:48 AM  SpO2 100 % 11/11/2018  9:48 AM  Vitals shown include unvalidated device data.  Last Pain:  Vitals:   11/11/18 0622  TempSrc: Tympanic  PainSc: 0-No pain      Patients Stated Pain Goal: 0 (59/74/71 8550)  Complications: No apparent anesthesia complications

## 2018-11-11 NOTE — Care Management (Signed)
Pt had stridor in the PACU. Racemic and decadron given with good results. Discussed with Dr. Owens Shark, radiology, no airway narrowing in the thyroid area. Dr.  Leafy Ro checked the pt. We feel she is ready to be d/c'd.

## 2018-11-11 NOTE — Discharge Instructions (Signed)
Laparoscopic Ovarian Surgery Discharge Instructions  For the next three days, take ibuprofen and acetaminophen on a schedule, every 8 hours. You can take them together or you can intersperse them, and take one every four hours. I also gave you gabapentin for nighttime, to help you sleep and also to control pain. Take gabapentin medicines at night for at least the next 3 nights. You also have a narcotic, oxycodone, to take as needed if the above medicines don't help.  Postop constipation is a major cause of pain. Stay well hydrated, walk as you tolerate, and take over the counter senna as well as stool softeners if you need them.   RISKS AND COMPLICATIONS   Infection.  Bleeding.  Injury to surrounding organs.  Anesthetic side effects.   PROCEDURE   You may be given a medicine to help you relax (sedative) before the procedure. You will be given a medicine to make you sleep (general anesthetic) during the procedure.  A tube will be put down your throat to help your breath while under general anesthesia.  Several small cuts (incisions) are made in the lower abdominal area and one incision is made near the belly button.  Your abdominal area will be inflated with a safe gas (carbon dioxide). This helps give the surgeon room to operate, visualize, and helps the surgeon avoid other organs.  A thin, lighted tube (laparoscope) with a camera attached is inserted into your abdomen through the incision near the belly button. Other small instruments may also be inserted through other abdominal incisions.  The ovary is located and are removed.  After the ovary is removed, the gas is released from the abdomen.  The incisions will be closed with stitches (sutures), and Dermabond. A bandage may be placed over the incisions.  AFTER THE PROCEDURE   You will also have some mild abdominal discomfort for 3-7 days. You will be given pain medicine to ease any discomfort.  As long as there are no  problems, you may be allowed to go home. Someone will need to drive you home and be with you for at least 24 hours once home.  You may have some mild discomfort in the throat. This is from the tube placed in your throat while you were sleeping.  You may experience discomfort in the shoulder area from some trapped air between the liver and diaphragm. This sensation is normal and will slowly go away on its own.  HOME CARE INSTRUCTIONS   Take all medicines as directed.  Only take over-the-counter or prescription medicines for pain, discomfort, or fever as directed by your caregiver.  Resume daily activities as directed.  Showers are preferred over baths for 2 weeks.  You may resume sexual activities in 1 week or as you feel you would like to.  Do not drive while taking narcotics.  SEEK MEDICAL CARE IF: .  There is increasing abdominal pain.  You feel lightheaded or faint.  You have the chills.  You have an oral temperature above 102 F (38.9 C).  There is pus-like (purulent) drainage from any of the wounds.  You are unable to pass gas or have a bowel movement.  You feel sick to your stomach (nauseous) or throw up (vomit) and can't control it with your medicines.  MAKE SURE YOU:   Understand these instructions.  Will watch your condition.  Will get help right away if you are not doing well or get worse.  ExitCare Patient Information 2013 [employer]     AMBULATORY SURGERY  DISCHARGE INSTRUCTIONS   1) The drugs that you were given will stay in your system until tomorrow so for the next 24 hours you should not:  A) Drive an automobile B) Make any legal decisions C) Drink any alcoholic beverage   2) You may resume regular meals tomorrow.  Today it is better to start with liquids and gradually work up to solid foods.  You may eat anything you prefer, but it is better to start with liquids, then soup and crackers, and gradually work up to solid  foods.   3) Please notify your doctor immediately if you have any unusual bleeding, trouble breathing, redness and pain at the surgery site, drainage, fever, or pain not relieved by medication.    4) Additional Instructions:        Please contact your physician with any problems or Same Day Surgery at [phone], Monday through Friday 6 am to 4 pm, or [place] [facility] [place] Main number at [phone].   

## 2018-11-11 NOTE — Anesthesia Preprocedure Evaluation (Signed)
Anesthesia Evaluation  Patient identified by MRN, date of birth, ID band Patient awake    Reviewed: Allergy & Precautions, NPO status , Patient's Chart, lab work & pertinent test results, reviewed documented beta blocker date and time   Airway Mallampati: III  TM Distance: >3 FB     Dental  (+) Chipped   Pulmonary COPD, former smoker,           Cardiovascular hypertension, Pt. on medications and Pt. on home beta blockers      Neuro/Psych  Headaches, PSYCHIATRIC DISORDERS Depression    GI/Hepatic GERD  Controlled,  Endo/Other  diabetes, Type 2Hyperthyroidism   Renal/GU      Musculoskeletal  (+) Arthritis ,   Abdominal   Peds  Hematology   Anesthesia Other Findings Smokes. Goiter. EKG ok.  Reproductive/Obstetrics                             Anesthesia Physical Anesthesia Plan  ASA: III  Anesthesia Plan: General   Post-op Pain Management:    Induction: Intravenous  PONV Risk Score and Plan:   Airway Management Planned: Oral ETT  Additional Equipment:   Intra-op Plan:   Post-operative Plan:   Informed Consent: I have reviewed the patients History and Physical, chart, labs and discussed the procedure including the risks, benefits and alternatives for the proposed anesthesia with the patient or authorized representative who has indicated his/her understanding and acceptance.       Plan Discussed with: CRNA  Anesthesia Plan Comments:         Anesthesia Quick Evaluation

## 2018-11-11 NOTE — Anesthesia Postprocedure Evaluation (Signed)
Anesthesia Post Note  Patient: Brooke Murphy  Procedure(s) Performed: [place] OOPHORECTOMY  Patient location during evaluation: PACU Anesthesia Type: General Level of consciousness: awake and alert Pain management: pain level controlled Vital Signs Assessment: post-procedure vital signs reviewed and stable Respiratory status: spontaneous breathing, nonlabored ventilation, respiratory function stable and patient connected to nasal cannula oxygen Cardiovascular status: blood pressure returned to baseline and stable Postop Assessment: no apparent nausea or vomiting Anesthetic complications: no     Last Vitals:  Vitals:   11/11/18 1203 11/11/18 1241  BP: (!) 156/87 135/78  Pulse: 80 71  Resp: 16 16  Temp:    SpO2: 97% 98%    Last Pain:  Vitals:   11/11/18 1241  TempSrc:   PainSc: 3      see notes.            Twanda Stakes S

## 2018-11-11 NOTE — Anesthesia Post-op Follow-up Note (Signed)
Anesthesia QCDR form completed.        

## 2018-11-11 NOTE — Progress Notes (Signed)
Pts pacu stay with 2 episodes of strider, pt sits up and desats, ricempic epi given , decadron IV given , Internal med and Dr Marcello Moores at bedside, CXR obtained and ENT consulted

## 2018-11-11 NOTE — Addendum Note (Signed)
Addendum  created 11/11/18 1351 by Gunnar Bulla, MD   Clinical Note Signed

## 2018-11-12 LAB — CYTOLOGY - NON PAP

## 2018-11-12 LAB — SURGICAL PATHOLOGY

## 2018-11-13 ENCOUNTER — Telehealth: Payer: Self-pay

## 2018-11-13 NOTE — Telephone Encounter (Signed)
noted 

## 2018-11-13 NOTE — Telephone Encounter (Signed)
Called and discussed pathology from surgery with Ms. Broz. Per discussion with Dr. Fransisca Connors with the benign finding she does not have to keep follow up appointment at the cancer center. She was instructed to all follow up with Dr. Leafy Ro.  DIAGNOSIS:  A. FALLOPIAN TUBE AND OVARY, RIGHT; SALPINGO-OOPHORECTOMY:  - BENIGN OVARIAN TISSUE WITH HEMORRHAGE, NECROSIS, AND ORGANIZING  FIBRIN, COMPATIBLE WITH CLINICAL IMPRESSION OF OVARIAN TORSION.  - MUCINOUS AND SEROUS CYSTADENOMA.  - HEMORRHAGIC CYST, FAVOR ENDOMETRIOMA.  - FIBROUS ADHESIONS.  - FALLOPIAN TUBE WITH BENIGN PARATUBAL CYSTS; OTHERWISE NO SIGNIFICANT  HISTOPATHOLOGIC CHANGE.   B. FALLOPIAN TUBE AND OVARY, LEFT; SALPINGO-OOPHORECTOMY:  - OVARY WITH BENIGN PHYSIOLOGIC CHANGES.  - FALLOPIAN TUBE WITH BENIGN PARATUBAL CYSTS; OTHERWISE NO SIGNIFICANT  HISTOPATHOLOGIC CHANGE.   C. OVARY, RIGHT; EXCISION:  - FRAGMENTS OF BENIGN HEMORRHAGIC CYST, FAVOR ENDOMETRIOMA.  - NEGATIVE FOR ATYPIA AND MALIGNANCY.    DIAGNOSIS:  A. PELVIC WASHINGS:  - NO MALIGNANCY IDENTIFIED.  - BLOOD AND MIXED INFLAMMATORY CELLS.

## 2018-11-16 NOTE — Addendum Note (Signed)
Addendum  created 11/16/18 0920 by Doreen Salvage, CRNA   Charge Capture section accepted

## 2018-11-17 ENCOUNTER — Other Ambulatory Visit: Payer: Self-pay | Admitting: Family

## 2018-11-17 DIAGNOSIS — E559 Vitamin D deficiency, unspecified: Secondary | ICD-10-CM

## 2018-11-18 NOTE — Telephone Encounter (Signed)
Call patient Please educate her that the 50,000 unit dose of vitamin D is not a maintenance dose.  We typically use this for 6 to 8 weeks to bring up vitamin D and then patient can stop.  Have ordered her vitamin D, she may come by our lab as we can check it.  If normal she would not need any supplementation.  She may stop the 50,000 units for now.

## 2018-12-04 ENCOUNTER — Telehealth: Payer: Self-pay | Admitting: Family

## 2018-12-04 MED ORDER — ONETOUCH DELICA LANCETS 30G MISC: 1.0000 | Freq: Two times a day (BID) | 3 refills | Status: DC

## 2018-12-04 NOTE — Telephone Encounter (Signed)
Copied from [place] [id number]. Topic: Quick Communication - Rx Refill/Question >> Dec 04, 2018  1:32 PM Scherrie Gerlach wrote: Medication: One touch delica lancets  Pt states the Lancets (ONETOUCH ULTRASOFT) lancets are not what she uses.  Way too big. (sent 10/05/2018)  She needs the [facility] lancets sent to  [id number] - [place], [place] - [phone] (Phone) [phone] (Fax)

## 2018-12-04 NOTE — Telephone Encounter (Signed)
Called pt. To verify the size and specific name of the [facility] Lancets needed.  Pt. stated "One Touch Delica Lancets, 30 gauge".  Stated she checks her blood sugar BID.  Advised pt. will send this to pharmacy.

## 2018-12-16 ENCOUNTER — Ambulatory Visit: Payer: BLUE CROSS/BLUE SHIELD

## 2018-12-22 ENCOUNTER — Other Ambulatory Visit: Payer: Self-pay | Admitting: Family

## 2019-02-03 ENCOUNTER — Other Ambulatory Visit: Payer: Self-pay | Admitting: Family

## 2019-02-03 DIAGNOSIS — I10 Essential (primary) hypertension: Secondary | ICD-10-CM

## 2019-02-09 ENCOUNTER — Telehealth: Payer: Self-pay | Admitting: *Deleted

## 2019-02-09 DIAGNOSIS — Z87891 Personal history of nicotine dependence: Secondary | ICD-10-CM

## 2019-02-09 DIAGNOSIS — R918 Other nonspecific abnormal finding of lung field: Secondary | ICD-10-CM

## 2019-02-09 DIAGNOSIS — Z122 Encounter for screening for malignant neoplasm of respiratory organs: Secondary | ICD-10-CM

## 2019-02-09 NOTE — Telephone Encounter (Signed)
Patient is scheduled for lcs nodule f/u ct scan.

## 2019-02-16 ENCOUNTER — Ambulatory Visit: Payer: BLUE CROSS/BLUE SHIELD

## 2019-02-24 ENCOUNTER — Ambulatory Visit
Admission: RE | Admit: 2019-02-24 | Discharge: 2019-02-24 | Disposition: A | Discharge status: Home / Self Care | Payer: No Typology Code available for payment source | Source: Ambulatory Visit | Attending: Nurse Practitioner | Admitting: Nurse Practitioner

## 2019-02-24 ENCOUNTER — Other Ambulatory Visit: Payer: Self-pay

## 2019-02-24 DIAGNOSIS — Z122 Encounter for screening for malignant neoplasm of respiratory organs: Secondary | ICD-10-CM | POA: Insufficient documentation

## 2019-02-24 DIAGNOSIS — R918 Other nonspecific abnormal finding of lung field: Secondary | ICD-10-CM | POA: Insufficient documentation

## 2019-02-24 DIAGNOSIS — Z87891 Personal history of nicotine dependence: Secondary | ICD-10-CM | POA: Insufficient documentation

## 2019-02-26 ENCOUNTER — Encounter: Payer: Self-pay | Admitting: *Deleted

## 2019-03-08 ENCOUNTER — Telehealth: Payer: Self-pay | Admitting: Family

## 2019-03-08 NOTE — Telephone Encounter (Signed)
Printed and mailed

## 2019-03-08 NOTE — Telephone Encounter (Signed)
Hope you are doing well.   I received results from your annual CT lung scan from [facility] which showed stable findings of lungs. You will need do this again next year so please ensure you are contacted in regards to this and certainly call our office if you are not contacted for another test.   It was noted again on the exam that you have coronary artery atherosclerosis, or often referred to as hardening of the arteries.Specifically it showed  calcified atherosclerotic plaque in the left main coronary artery.  This can put you at risk for stroke, heart attack.   Please make a follow up appointment with me so can continue to monitor your cholesterol and cholesterol as you are due for labs to monitor cholesterol.   I also would like to discuss consult with cardiology based on these findings to further evaluate degree of cholesterol build up in the heart.    Look forward to seeing you!   Best,   [name], NP

## 2019-03-31 ENCOUNTER — Encounter: Payer: Self-pay | Admitting: Family

## 2019-03-31 ENCOUNTER — Ambulatory Visit (INDEPENDENT_AMBULATORY_CARE_PROVIDER_SITE_OTHER): Payer: BC Managed Care – PPO | Admitting: Family

## 2019-03-31 ENCOUNTER — Other Ambulatory Visit: Payer: Self-pay

## 2019-03-31 DIAGNOSIS — E785 Hyperlipidemia, unspecified: Secondary | ICD-10-CM | POA: Diagnosis not present

## 2019-03-31 DIAGNOSIS — E119 Type 2 diabetes mellitus without complications: Secondary | ICD-10-CM

## 2019-03-31 DIAGNOSIS — F329 Major depressive disorder, single episode, unspecified: Secondary | ICD-10-CM

## 2019-03-31 DIAGNOSIS — E559 Vitamin D deficiency, unspecified: Secondary | ICD-10-CM

## 2019-03-31 DIAGNOSIS — I10 Essential (primary) hypertension: Secondary | ICD-10-CM

## 2019-03-31 DIAGNOSIS — Z1211 Encounter for screening for malignant neoplasm of colon: Secondary | ICD-10-CM | POA: Diagnosis not present

## 2019-03-31 DIAGNOSIS — E049 Nontoxic goiter, unspecified: Secondary | ICD-10-CM

## 2019-03-31 DIAGNOSIS — F32A Depression, unspecified: Secondary | ICD-10-CM

## 2019-03-31 NOTE — Assessment & Plan Note (Signed)
Suspect a little elevated. Patient will send me log from home to adjust medications from there.

## 2019-03-31 NOTE — Assessment & Plan Note (Signed)
Diet controlled.  

## 2019-03-31 NOTE — Assessment & Plan Note (Signed)
Continues to follow with endocrine. Understands the importance of following annually for thyroid nodules. Will follow

## 2019-03-31 NOTE — Patient Instructions (Addendum)
Send me blood pressures readings, and heart rate from home. Maybe 5 different days.   Monitor blood pressure,  Goal is less than 120/80, based on newest guidelines; if persistently higher, please make sooner follow up appointment so we can recheck you blood pressure and manage medications  Fasting labs  Please ensure you follow up with endocrine.   Today we discussed referrals, orders. colonoscopy   I have placed these orders in the system for you.  Please be sure to give [place] a call if you have not heard from our office regarding this. We should hear from [place] within ONE week with information regarding your appointment. If not, please let me know immediately.    Stay safe!

## 2019-03-31 NOTE — Assessment & Plan Note (Signed)
Stable. Continue remeron

## 2019-03-31 NOTE — Progress Notes (Signed)
This visit type was conducted due to national recommendations for restrictions regarding the COVID-19 pandemic (e.g. social distancing).  This format is felt to be most appropriate for this patient at this time.  All issues noted in this document were discussed and addressed.  No physical exam was performed (except for noted visual exam findings with Video Visits). Virtual Visit via Video Note  I connected with@  on 03/31/19 at  4:00 PM EDT by a video enabled telemedicine application and verified that I am speaking with the correct person using two identifiers.  Location patient: home Location provider:work  Persons participating in the virtual visit: patient, provider  I discussed the limitations of evaluation and management by telemedicine and the availability of in person appointments. The patient expressed understanding and agreed to proceed.  Interactive audio and video telecommunications were attempted between this provider and patient, however failed, due to patient having technical difficulties or patient did not have access to video capability.  We continued and completed visit with audio only.   HPI:  Feels well. No concerns.   S/p hysterectomy- abdominal pain has resolved since.   HTN- at home BP, 138/70.  Denies exertional chest pain or pressure, numbness or tingling radiating to left arm or jaw, palpitations, dizziness, frequent headaches, changes in vision, or shortness of breath.   DM- 'sugar is doing great.' No hypoglycemic episodes. Diet managed.   Depression- doing well on remeron as needed.   Gabapentin prn when low back pain bothering her.   Goiter- Endocrine , follows annually. Swallowing okay.   ROS: See pertinent positives and negatives per HPI.  Past Medical History:  Diagnosis Date  . Brain aneurysm 1999   [facility]  . Diabetes mellitus without complication (HCC)    diet controlled  . GERD (gastroesophageal reflux disease)   . Hyperlipidemia   .  Hypertension   . Hyperthyroidism   . Rheumatoid arthritis ([place]) 2002   s/p methotrexate, humira and embrel  . Thyroid nodule   . Ulcer of abdomen wall ([place])   . Vitamin D deficiency     Past Surgical History:  Procedure Laterality Date  . ABDOMINAL HYSTERECTOMY  2005   ovaries remain; WITH CERVIX  . BRAIN SURGERY Left 11/27/1997   aneurysm  . COLONOSCOPY    . LAPAROSCOPIC BILATERAL SALPINGO OOPHERECTOMY  11/11/2018   Procedure: LAPAROSCOPIC BILATERAL SALPINGO OOPHORECTOMY;  Surgeon: Benjaman Kindler, MD;  Location: [facility] ORS;  Service: Gynecology;;    Family History  Problem Relation Age of Onset  . Hypertension Mother   . Aneurysm Mother 14       died  . Hyperlipidemia Sister   . Hypertension Sister   . Cancer Father 70       throat cancer  . Diabetes Maternal Aunt   . Breast cancer Maternal Aunt 65  . Hypertension Maternal Grandmother   . Stroke Maternal Grandmother   . Colon cancer Neg Hx     SOCIAL HX: former smoker   Current Outpatient Medications:  .  amLODipine (NORVASC) 5 MG tablet, Take 1 tablet (5 mg total) by mouth daily., Disp: 90 tablet, Rfl: 3 .  atorvastatin (LIPITOR) 20 MG tablet, Take 1 tablet (20 mg total) by mouth daily., Disp: 90 tablet, Rfl: 2 .  bimatoprost (LUMIGAN) 0.01 % SOLN, Place 1 drop into both eyes at bedtime., Disp: , Rfl:  .  diclofenac sodium (VOLTAREN) 1 % GEL, Apply 4 g topically 4 (four) times daily. (Patient taking differently: Apply 4 g topically 4 (four)  times daily as needed (joint pain). ), Disp: 1 Tube, Rfl: 3 .  fluticasone (FLONASE) 50 MCG/ACT nasal spray, Place 2 sprays into both nostrils daily. (Patient taking differently: Place 2 sprays into both nostrils daily as needed for allergies. ), Disp: 16 g, Rfl: 6 .  gabapentin (NEURONTIN) 100 MG capsule, TAKE 1 CAPSULE (100 MG TOTAL) BY MOUTH 3 (THREE) TIMES DAILY AS NEEDED (FOR HEADACHE/NERVE PAIN)., Disp: 90 capsule, Rfl: 0 .  glucose blood (ONETOUCH VERIO) test strip, Used to  check blood sugar once daily., Disp: 50 each, Rfl: 3 .  ipratropium-albuterol (DUONEB) 0.5-2.5 (3) MG/3ML SOLN, Take 3 mLs by nebulization every 4 (four) hours as needed., Disp: 360 mL, Rfl: 1 .  lidocaine (LIDODERM) 5 %, Place 1 patch onto the skin daily. Remove & Discard patch within 12 hours. (Patient taking differently: Place 1 patch onto the skin daily as needed (pain). Remove & Discard patch within 12 hours.), Disp: 30 patch, Rfl: 0 .  losartan (COZAAR) 100 MG tablet, Take 1 tablet (100 mg total) by mouth daily., Disp: 90 tablet, Rfl: 3 .  metoprolol tartrate (LOPRESSOR) 25 MG tablet, TAKE 1 TABLET BY MOUTH TWICE A DAY, Disp: 60 tablet, Rfl: 2 .  mirtazapine (REMERON) 30 MG tablet, Take 1 tablet (30 mg total) by mouth at bedtime., Disp: 90 tablet, Rfl: 1 .  omeprazole (PRILOSEC) 20 MG capsule, TAKE 1 CAPSULE BY MOUTH EVERY DAY, Disp: 30 capsule, Rfl: 3 .  OneTouch Delica Lancets 82L MISC, 1 Stick by Percutaneous route 2 (two) times daily. as directed, Disp: 100 each, Rfl: 3  EXAM:  VITALS per patient if applicable: BP Readings from Last 3 Encounters:  11/11/18 135/78  11/04/18 (!) 147/92  10/28/18 123/83     GENERAL: alert, oriented, appears well and in no acute distress  HEENT: atraumatic, conjunttiva clear, no obvious abnormalities on inspection of external nose and ears  NECK: normal movements of the head and neck  LUNGS: on inspection no signs of respiratory distress, breathing rate appears normal, no obvious gross SOB, gasping or wheezing  CV: no obvious cyanosis  MS: moves all visible extremities without noticeable abnormality  PSYCH/NEURO: pleasant and cooperative, no obvious depression or anxiety, speech and thought processing grossly intact  ASSESSMENT AND PLAN:  Discussed the following assessment and plan:  Problem List Items Addressed This Visit      Cardiovascular and Mediastinum   HTN (hypertension) - Primary    Suspect a little elevated. Patient will send  me log from home to adjust medications from there.       Relevant Orders   Comprehensive metabolic panel     Endocrine   Goiter    Continues to follow with endocrine. Understands the importance of following annually for thyroid nodules. Will follow      Diabetes mellitus without complication ([place])    Diet controlled      Relevant Orders   Hemoglobin A1c     Other   HLD (hyperlipidemia)   Relevant Orders   Lipid panel   Depression    Stable. Continue remeron       Other Visit Diagnoses    Screen for colon cancer       Relevant Orders   Ambulatory referral to Gastroenterology   Vitamin D deficiency       Relevant Orders   VITAMIN D 25 Hydroxy (Vit-D Deficiency, Fractures)        I discussed the assessment and treatment plan with the patient. The patient was  provided an opportunity to ask questions and all were answered. The patient agreed with the plan and demonstrated an understanding of the instructions.   The patient was advised to call back or seek an in-person evaluation if the symptoms worsen or if the condition fails to improve as anticipated.   [name], FNP   I spent 25 min non face to face w/ pt.

## 2019-04-21 ENCOUNTER — Other Ambulatory Visit: Payer: BC Managed Care – PPO

## 2019-04-24 ENCOUNTER — Other Ambulatory Visit: Payer: Self-pay | Admitting: Family

## 2019-04-24 DIAGNOSIS — I10 Essential (primary) hypertension: Secondary | ICD-10-CM

## 2019-05-05 ENCOUNTER — Encounter: Payer: Self-pay | Admitting: *Deleted

## 2019-08-22 IMAGING — CT CT ABD-PELV W/ CM
2 of 5 series · 16 of 46 positions shown, 18 images · IV contrast (APPLIED)
Comparison: CT scan of December 22, 2010.

CLINICAL DATA: Acute right lower quadrant abdominal pain.

EXAM:
CT ABDOMEN AND PELVIS WITH CONTRAST
TECHNIQUE: Multidetector CT imaging of the abdomen and pelvis was performed
using the standard protocol following bolus administration of
intravenous contrast.
CONTRAST:  100mL 0ARYDB-SXX IOPAMIDOL (0ARYDB-SXX) INJECTION 61%

[Series 2: routine abd/pel with · axial · 0.67mm/px · z∈[+685,+1085]mm · 13 of 91 slices shown, 15 images]
[im 6/91  soft-tissue]
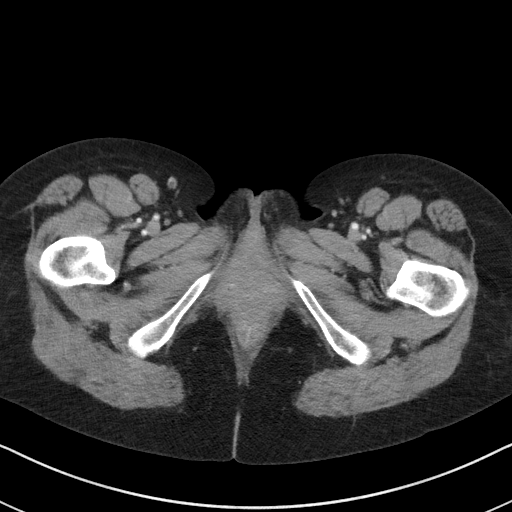
[im 6/91  bone]
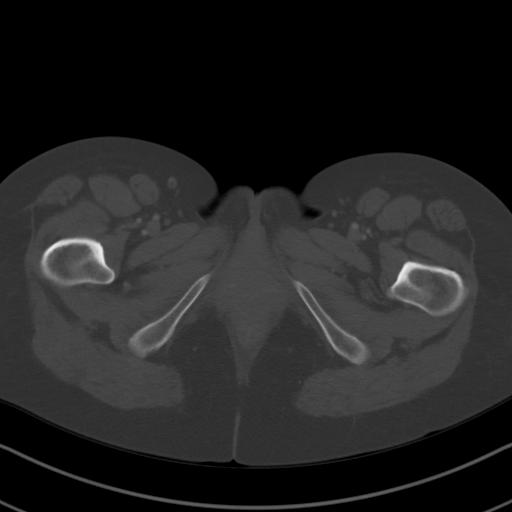
[im 11/91  soft-tissue]
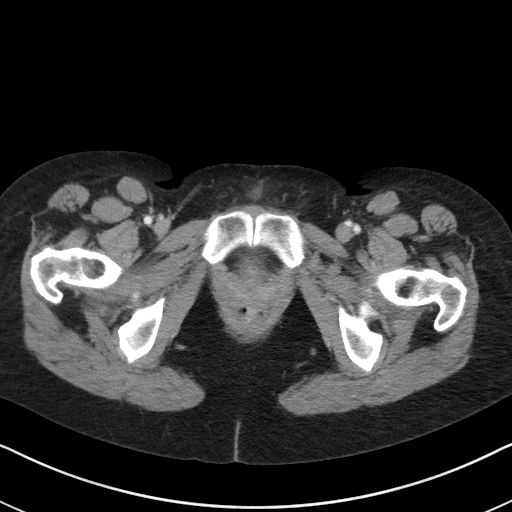
[im 21/91  soft-tissue]
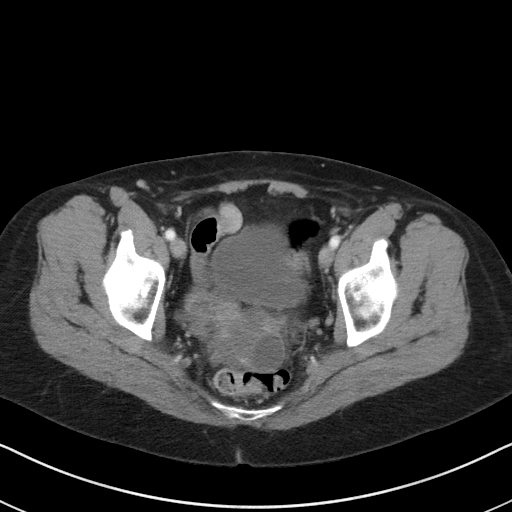
[im 26/91  soft-tissue]
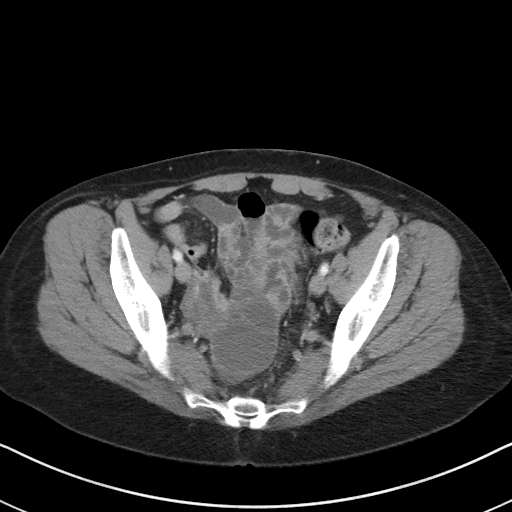
[im 31/91  soft-tissue]
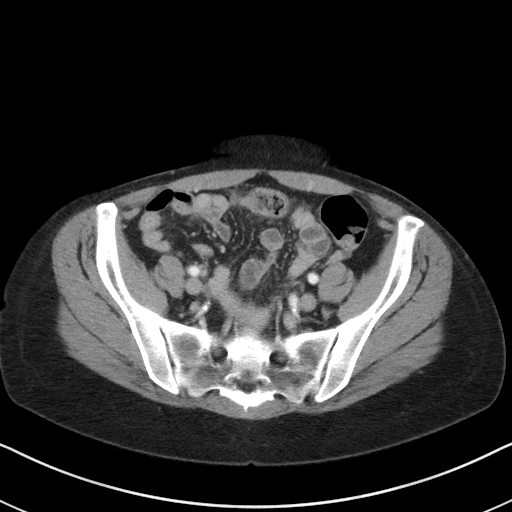
[im 41/91  soft-tissue]
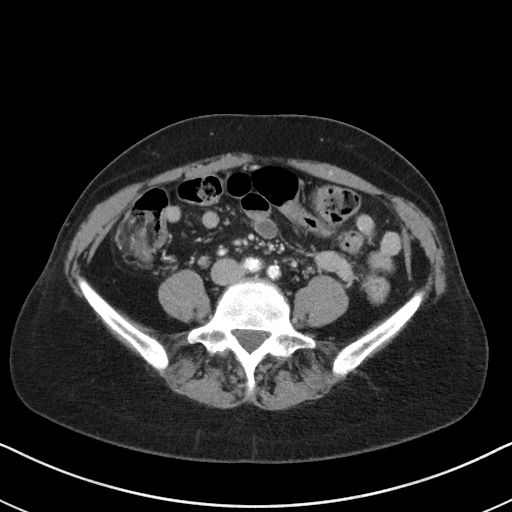
[im 46/91  soft-tissue]
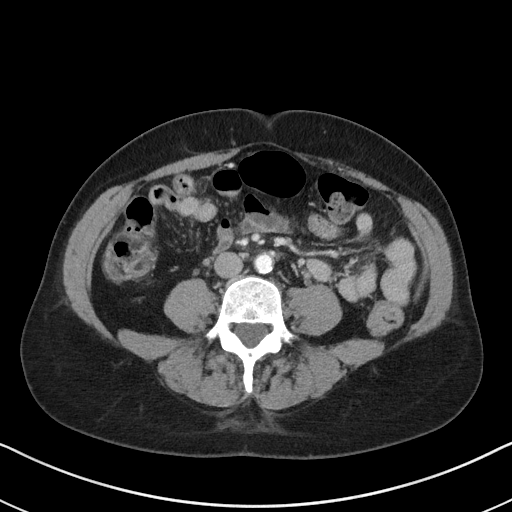
[im 51/91  soft-tissue]
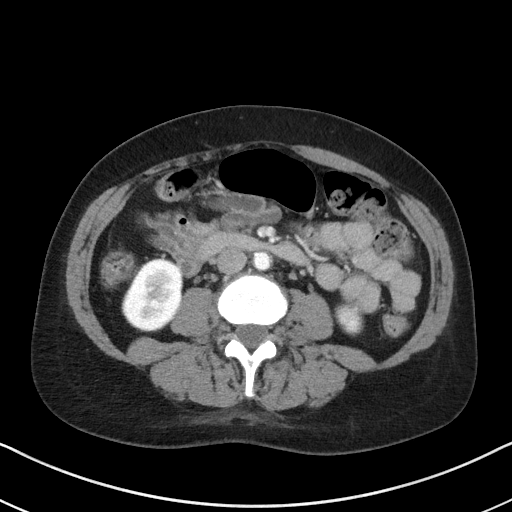
[im 61/91  soft-tissue]
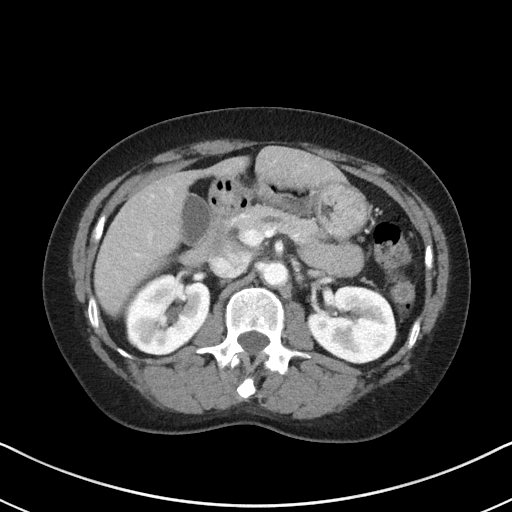
[im 61/91  bone]
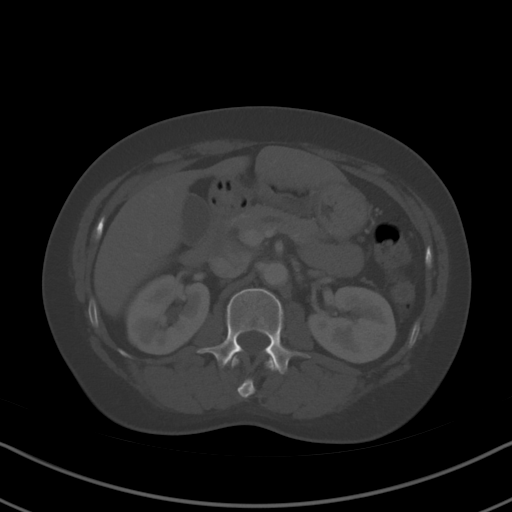
[im 66/91  soft-tissue]
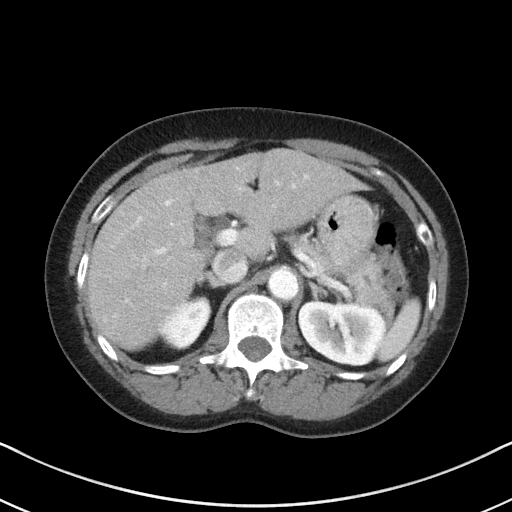
[im 71/91  soft-tissue]
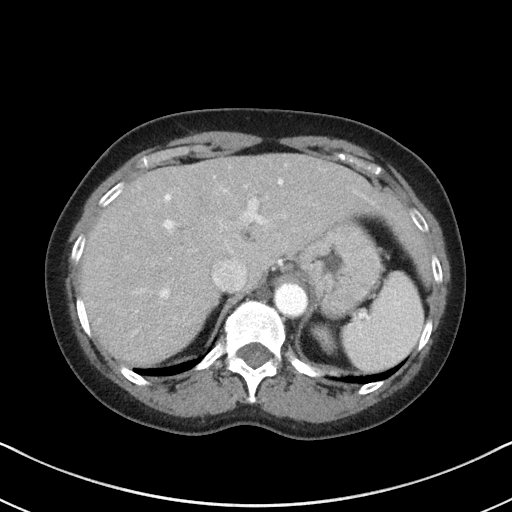
[im 81/91  soft-tissue]
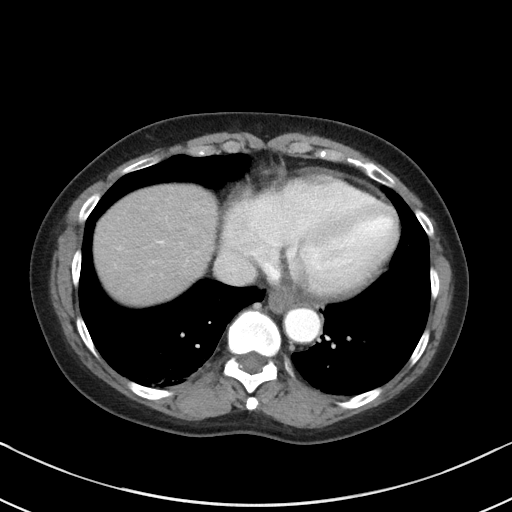
[im 86/91  soft-tissue]
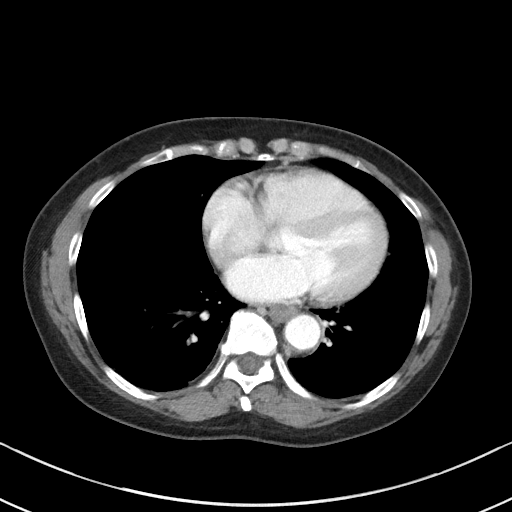

[Series 5: coronal st · coronal · 0.69mm/px · 3 of 82 slices shown]
[im 28/82  soft-tissue]
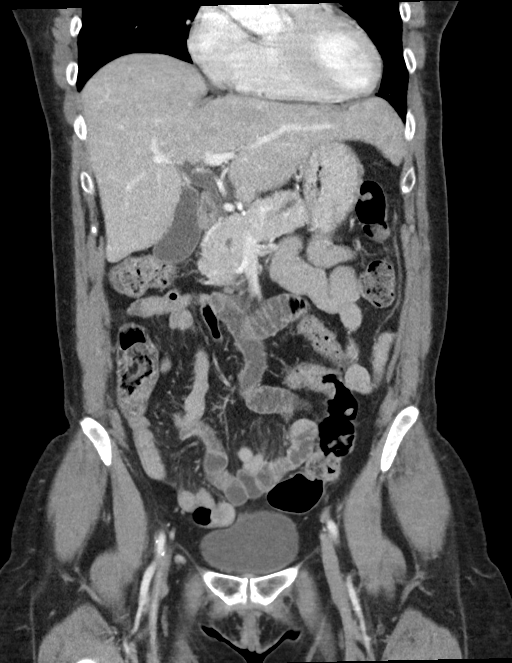
[im 37/82  soft-tissue]
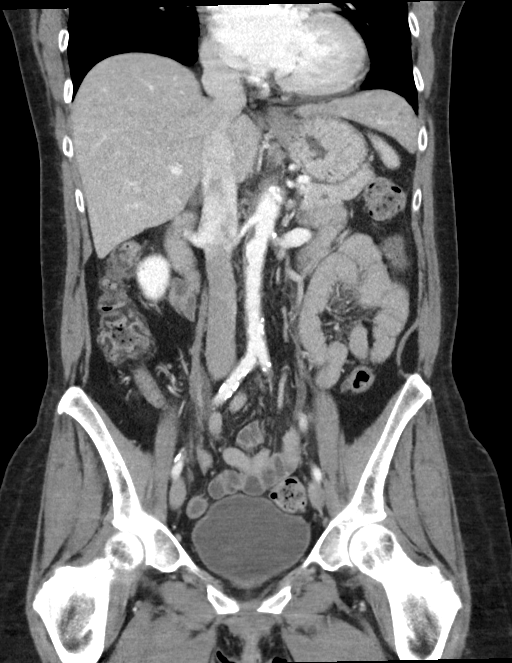
[im 46/82  soft-tissue]
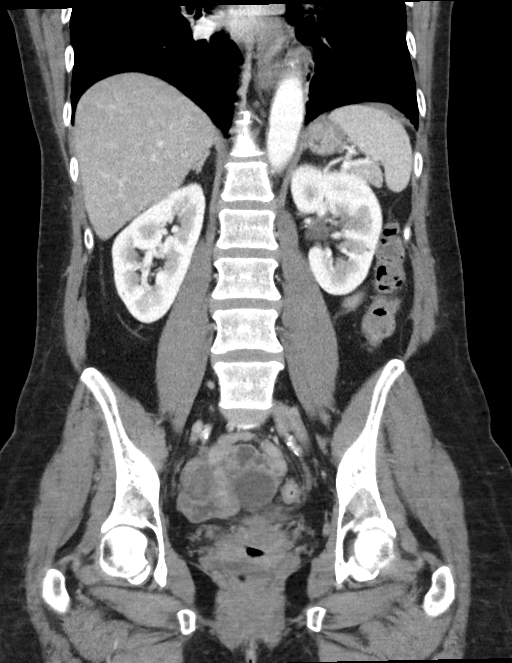

[16 of 46 positions shown; findings below may reference images not displayed]

FINDINGS: Lower chest: No acute abnormality.

Hepatobiliary: No focal liver abnormality is seen. No gallstones,
gallbladder wall thickening, or biliary dilatation.

Pancreas: Unremarkable. No pancreatic ductal dilatation or
surrounding inflammatory changes.

Spleen: Normal in size without focal abnormality.

Adrenals/Urinary Tract: Adrenal glands are unremarkable. Kidneys are
normal, without renal calculi, focal lesion, or hydronephrosis.
Bladder is unremarkable.

Stomach/Bowel: Stomach is within normal limits. Appendix appears
normal. No evidence of bowel wall thickening, distention, or
inflammatory changes.

Vascular/Lymphatic: Aortic atherosclerosis. No enlarged abdominal or
pelvic lymph nodes.

Reproductive: Status post hysterectomy. Interval development of
large complex mass measuring 7.6 x 5.2 cm in the right adnexal
region. Left ovary is not visualized.

Other: No abdominal wall hernia or abnormality. No abdominopelvic
ascites.

Musculoskeletal: No acute or significant osseous findings.
IMPRESSION: 7.6 x 5.2 cm complex mass seen in right adnexal region concerning
for possible ovarian neoplasm. Further evaluation with pelvic
ultrasound is recommended.

Aortic Atherosclerosis (G29AM-3MD.D).

## 2019-09-26 ENCOUNTER — Other Ambulatory Visit: Payer: Self-pay | Admitting: Family

## 2019-09-26 DIAGNOSIS — E785 Hyperlipidemia, unspecified: Secondary | ICD-10-CM

## 2019-09-26 DIAGNOSIS — I10 Essential (primary) hypertension: Secondary | ICD-10-CM

## 2019-09-27 ENCOUNTER — Other Ambulatory Visit: Payer: Self-pay | Admitting: Family

## 2019-09-27 DIAGNOSIS — I10 Essential (primary) hypertension: Secondary | ICD-10-CM

## 2019-12-03 ENCOUNTER — Other Ambulatory Visit: Payer: Self-pay | Admitting: Family

## 2019-12-03 DIAGNOSIS — I10 Essential (primary) hypertension: Secondary | ICD-10-CM

## 2019-12-03 DIAGNOSIS — E785 Hyperlipidemia, unspecified: Secondary | ICD-10-CM

## 2020-01-03 ENCOUNTER — Other Ambulatory Visit: Payer: Self-pay | Admitting: Family

## 2020-01-03 DIAGNOSIS — I10 Essential (primary) hypertension: Secondary | ICD-10-CM

## 2020-01-04 ENCOUNTER — Other Ambulatory Visit: Payer: Self-pay | Admitting: Family

## 2020-01-04 DIAGNOSIS — I10 Essential (primary) hypertension: Secondary | ICD-10-CM

## 2020-01-14 IMAGING — CT CT CHEST LCS NODULE FOLLOW-UP W/O CM
2 of 5 series · 14 of 40 positions shown, 17 images · non-contrast
Comparison: Chest CT 08/17/2018.

CLINICAL DATA: 57-year-old female former smoker (quit 7 months ago)
with 33 pack-year history of smoking. Lung cancer screening
examination.

EXAM:
CT CHEST WITHOUT CONTRAST FOR LUNG CANCER SCREENING NODULE FOLLOW-UP
TECHNIQUE: Multidetector CT imaging of the chest was performed following the
standard protocol without IV contrast.

[Series 4: lung lcs f/u · axial · 0.55mm/px · z∈[-1253,-943]mm · 11 of 344 slices shown, 14 images]
[im 17/344  mediastinal]
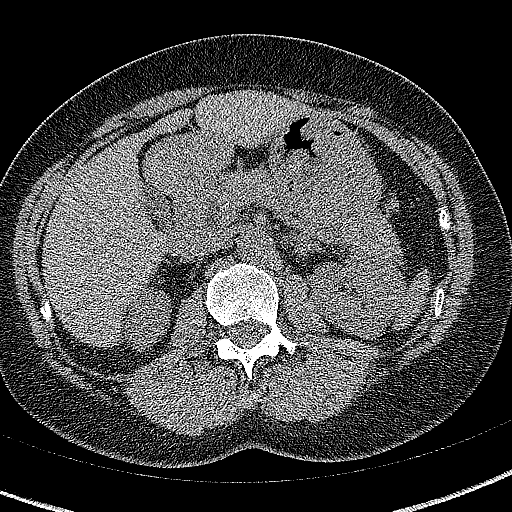
[im 17/344  lung]
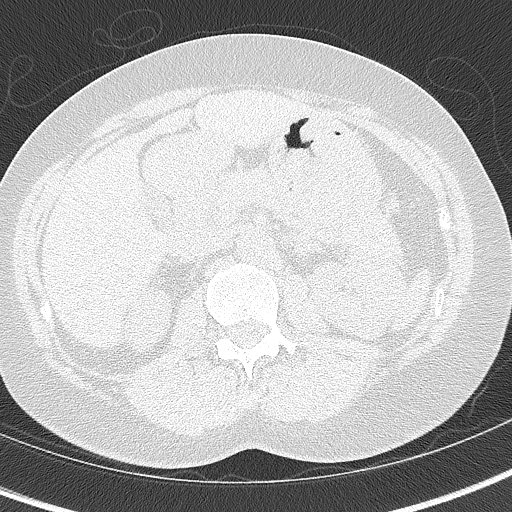
[im 50/344  lung]
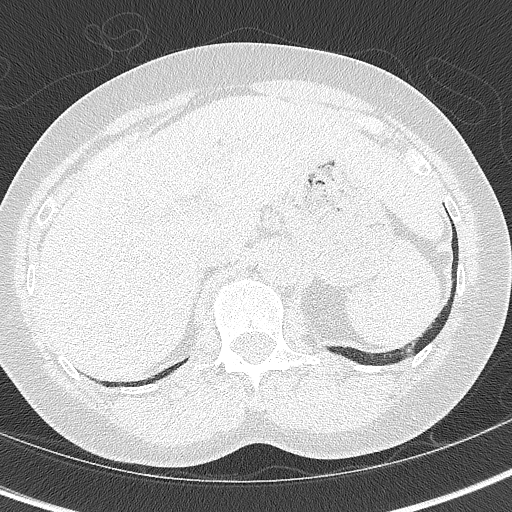
[im 82/344  lung]
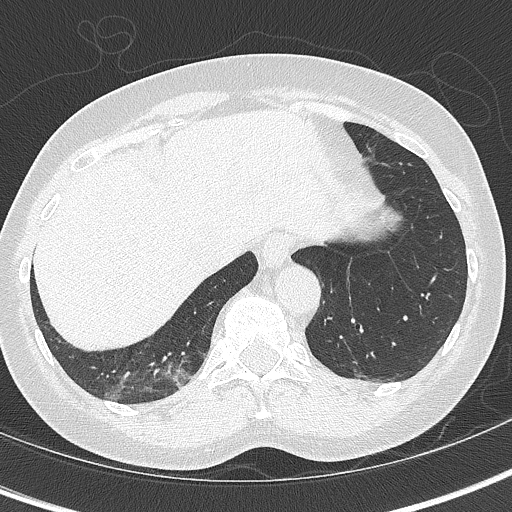
[im 115/344  lung]
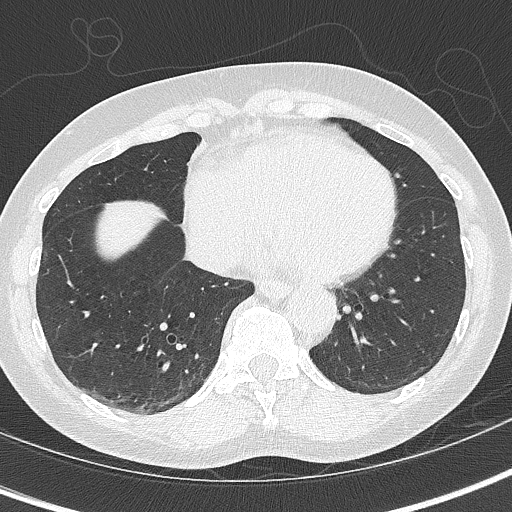
[im 148/344  mediastinal]
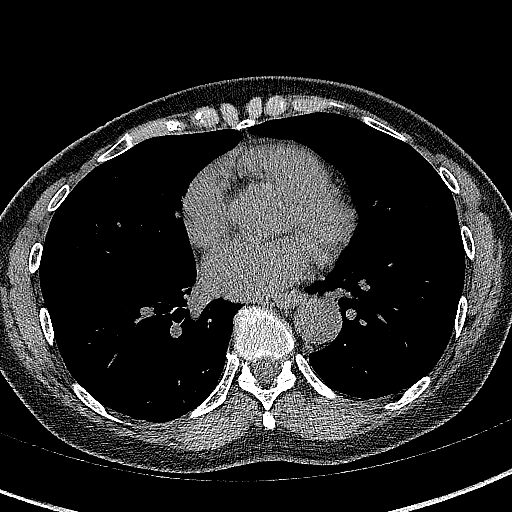
[im 148/344  lung]
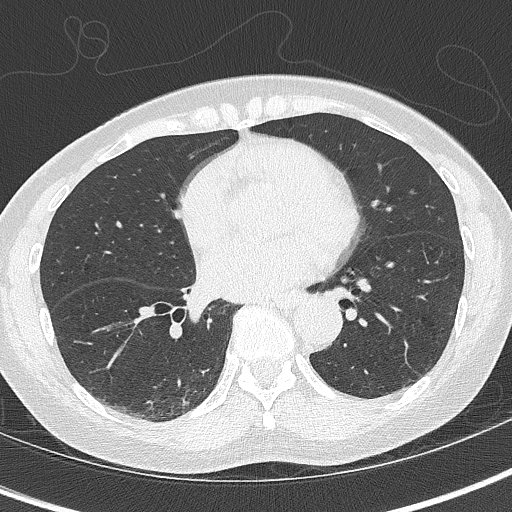
[im 180/344  lung]
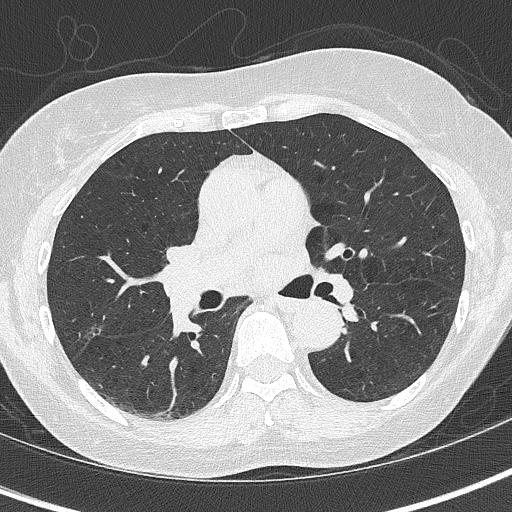
[im 197/344  lung]
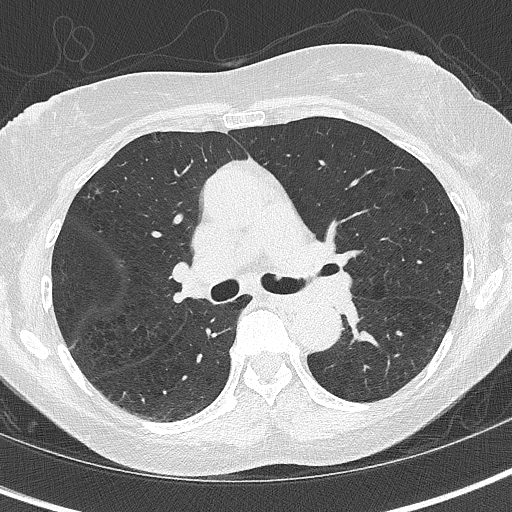
[im 229/344  lung]
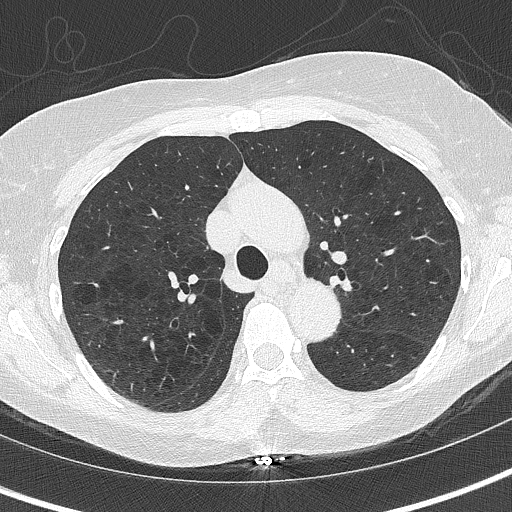
[im 262/344  mediastinal]
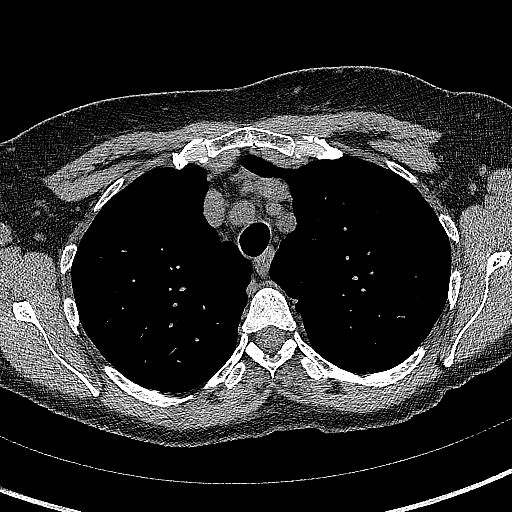
[im 262/344  lung]
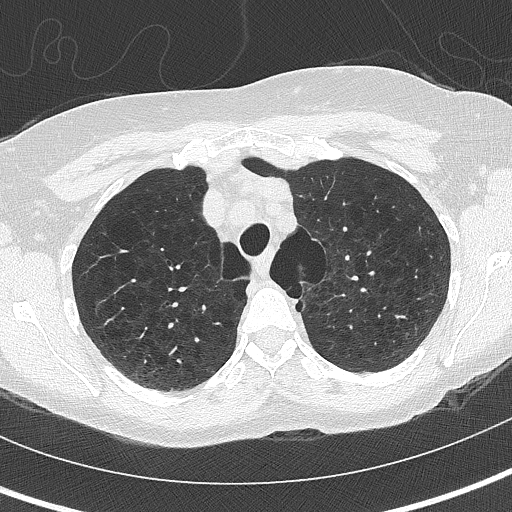
[im 295/344  lung]
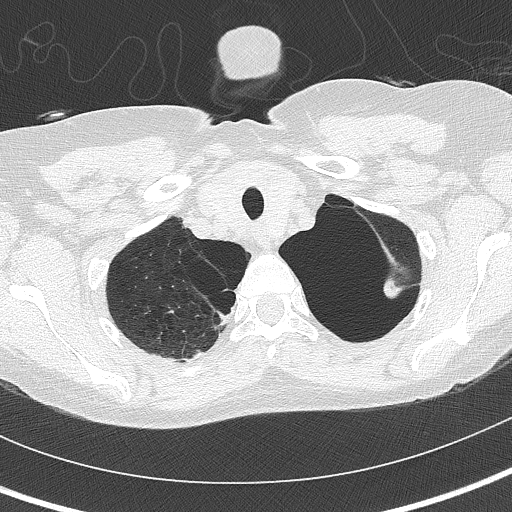
[im 327/344  lung]
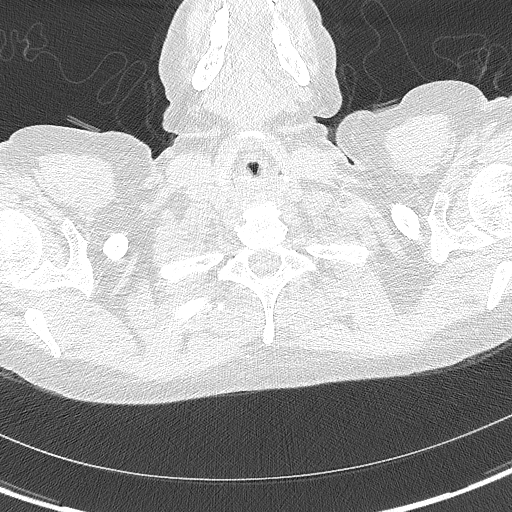

[Series 5: lcs f/u · coronal · 0.55mm/px · 3 of 282 slices shown]
[im 57/282  lung]
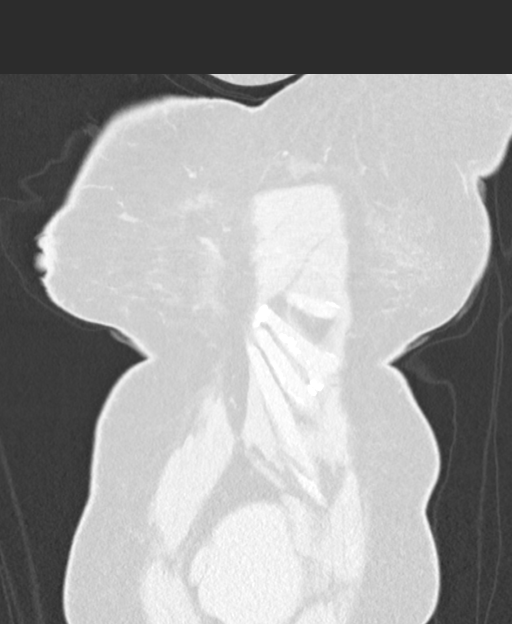
[im 113/282  lung]
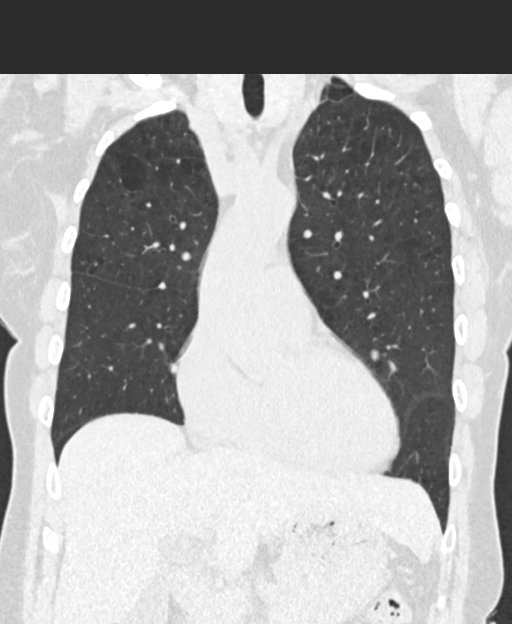
[im 169/282  lung]
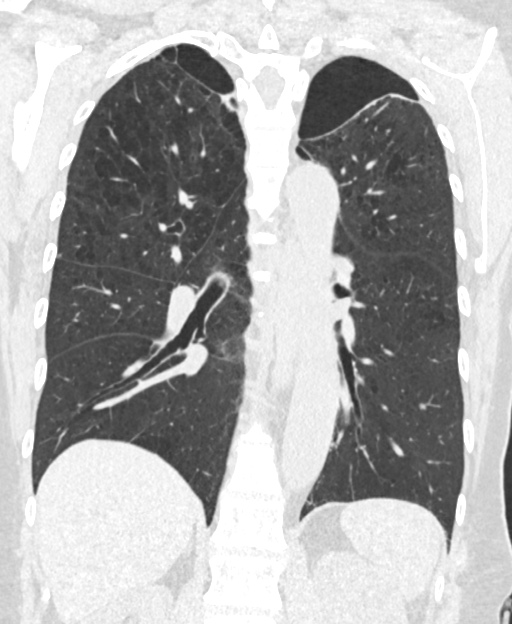

[14 of 40 positions shown; findings below may reference images not displayed]

FINDINGS: Cardiovascular: Heart size is normal. There is no significant
pericardial fluid, thickening or pericardial calcification. There is
aortic atherosclerosis, as well as atherosclerosis of the great
vessels of the mediastinum and the coronary arteries, including
calcified atherosclerotic plaque in the left main coronary artery.

Mediastinum/Nodes: No pathologically enlarged mediastinal or hilar
lymph nodes. Please note that accurate exclusion of hilar adenopathy
is limited on noncontrast CT scans. Esophagus is unremarkable in
appearance. No axillary lymphadenopathy.

Lungs/Pleura: Again noted are multiple small pulmonary nodules
scattered throughout the lungs bilaterally, similar in size and
number to the prior examination, with the largest of these in the
medial aspect of the left upper lobe near the apex where there is a
nodular area of pleuroparenchymal thickening (axial image 59 of
series 3), with a volume derived mean diameter of 9.8 mm. No other
new larger more suspicious appearing pulmonary nodules or masses are
noted. No acute consolidative airspace disease. No pleural
effusions. Mild diffuse bronchial wall thickening with moderate to
severe centrilobular and paraseptal emphysema, including large
bilateral apically subpleural bullae.

Upper Abdomen: Aortic atherosclerosis.

Musculoskeletal: There are no aggressive appearing lytic or blastic
lesions noted in the visualized portions of the skeleton.
IMPRESSION: 1. Lung-RADS 2S, benign appearance or behavior. Continue annual
screening with low-dose chest CT without contrast in 12 months.
2. The "S" modifier above refers to potentially clinically
significant non lung cancer related findings. Specifically, there is
aortic atherosclerosis, in addition to left main coronary artery
disease. Please note that although the presence of coronary artery
calcium documents the presence of coronary artery disease, the
severity of this disease and any potential stenosis cannot be
assessed on this non-gated CT examination. Assessment for potential
risk factor modification, dietary therapy or pharmacologic therapy
may be warranted, if clinically indicated.
3. Mild diffuse bronchial wall thickening with moderate to severe
centrilobular and paraseptal emphysema; imaging findings suggestive
of underlying COPD.

Aortic Atherosclerosis (B9R4G-4XT.T) and Emphysema (B9R4G-DTZ.A).

## 2020-02-03 ENCOUNTER — Other Ambulatory Visit: Payer: Self-pay | Admitting: Family

## 2020-02-03 DIAGNOSIS — E785 Hyperlipidemia, unspecified: Secondary | ICD-10-CM

## 2020-02-03 DIAGNOSIS — I10 Essential (primary) hypertension: Secondary | ICD-10-CM

## 2020-02-16 ENCOUNTER — Telehealth: Payer: Self-pay

## 2020-02-16 NOTE — Telephone Encounter (Signed)
Patine notified that it is time to schedule the low dose lung cancer screening CT scan.  She does not want to get scheduled at this time due to pending insurance activation and ask for a call at a later date.

## 2020-04-06 ENCOUNTER — Other Ambulatory Visit: Payer: Self-pay | Admitting: Family

## 2020-04-06 DIAGNOSIS — E785 Hyperlipidemia, unspecified: Secondary | ICD-10-CM

## 2020-04-06 DIAGNOSIS — I10 Essential (primary) hypertension: Secondary | ICD-10-CM

## 2020-04-14 ENCOUNTER — Telehealth: Payer: Self-pay | Admitting: *Deleted

## 2020-04-14 NOTE — Telephone Encounter (Signed)
\(  04/14/2020) Left message for pt to notify them that it is time to schedule annual low dose lung cancer screening CT scan. Instructed patient to call back to verify information prior to the scan being scheduled °SRW °  ° ° °

## 2020-04-21 ENCOUNTER — Telehealth: Payer: Self-pay | Admitting: Family

## 2020-04-21 NOTE — Telephone Encounter (Signed)
Pt called in and said her BP is 170/130 and when she takes her medication she is getting disoriented and just out of it. I went ahead and scheduled appointment for 04/25/20 and transferred her to be triaged by [facility].

## 2020-04-21 NOTE — Telephone Encounter (Signed)
Spoken with pt she stated she has been getting disoriented and bp has been elevated. Her whole right side of her body went numb for 30 minutes and she is having a slight headache. I instructed patient to go to ED to be evaluated for a stroke due to patients sx. She is not having heart palpitations, slurred speech, chest pain, and blurred vision. Patient stated she would comply and have son drive her to ED.

## 2020-04-25 ENCOUNTER — Other Ambulatory Visit: Payer: Self-pay

## 2020-04-25 ENCOUNTER — Encounter: Payer: Self-pay | Admitting: Family

## 2020-04-25 ENCOUNTER — Ambulatory Visit (INDEPENDENT_AMBULATORY_CARE_PROVIDER_SITE_OTHER): Payer: BLUE CROSS/BLUE SHIELD | Admitting: Family

## 2020-04-25 ENCOUNTER — Other Ambulatory Visit: Payer: Self-pay | Admitting: Family

## 2020-04-25 ENCOUNTER — Telehealth: Payer: Self-pay | Admitting: *Deleted

## 2020-04-25 VITALS — BP 165/100 | HR 61 | Temp 98.2°F | Resp 14 | Ht 65.75 in | Wt 129.4 lb

## 2020-04-25 DIAGNOSIS — R7989 Other specified abnormal findings of blood chemistry: Secondary | ICD-10-CM

## 2020-04-25 DIAGNOSIS — E785 Hyperlipidemia, unspecified: Secondary | ICD-10-CM

## 2020-04-25 DIAGNOSIS — I10 Essential (primary) hypertension: Secondary | ICD-10-CM | POA: Diagnosis not present

## 2020-04-25 LAB — LIPID PANEL
Cholesterol: 182 mg/dL (ref 0–200)
HDL: 64.6 mg/dL (ref >39.00)
LDL Cholesterol: 91 mg/dL (ref 0–99)
NonHDL: 117.7
Total CHOL/HDL Ratio: 3
Triglycerides: 135 mg/dL (ref 0.0–149.0)
VLDL: 27 mg/dL (ref 0.0–40.0)

## 2020-04-25 LAB — COMPREHENSIVE METABOLIC PANEL
ALT: 10 U/L (ref 0–35)
AST: 15 U/L (ref 0–37)
Albumin: 4.7 g/dL (ref 3.5–5.2)
Alkaline Phosphatase: 72 U/L (ref 39–117)
BUN: 9 mg/dL (ref 6–23)
CO2: 30 mEq/L (ref 19–32)
Calcium: 10.3 mg/dL (ref 8.4–10.5)
Chloride: 99 mEq/L (ref 96–112)
Creatinine, Ser: 0.7 mg/dL (ref 0.40–1.20)
GFR: 103.02 mL/min (ref >60.00)
Glucose, Bld: 84 mg/dL (ref 70–99)
Potassium: 4.6 mEq/L (ref 3.5–5.1)
Sodium: 134 mEq/L — ABNORMAL LOW (ref 135–145)
Total Bilirubin: 0.9 mg/dL (ref 0.2–1.2)
Total Protein: 7.3 g/dL (ref 6.0–8.3)

## 2020-04-25 LAB — CBC WITH DIFFERENTIAL/PLATELET
Basophils Absolute: 0.1 10*3/uL (ref 0.0–0.1)
Basophils Relative: 1.1 % (ref 0.0–3.0)
Eosinophils Absolute: 0.1 10*3/uL (ref 0.0–0.7)
Eosinophils Relative: 1.7 % (ref 0.0–5.0)
HCT: 43.3 % (ref 36.0–46.0)
Hemoglobin: 14.2 g/dL (ref 12.0–15.0)
Lymphocytes Relative: 48.6 % — ABNORMAL HIGH (ref 12.0–46.0)
Lymphs Abs: 3.2 10*3/uL (ref 0.7–4.0)
MCHC: 32.9 g/dL (ref 30.0–36.0)
MCV: 95.9 fl (ref 78.0–100.0)
Monocytes Absolute: 0.4 10*3/uL (ref 0.1–1.0)
Monocytes Relative: 5.5 % (ref 3.0–12.0)
Neutro Abs: 2.8 10*3/uL (ref 1.4–7.7)
Neutrophils Relative %: 43.1 % (ref 43.0–77.0)
Platelets: 312 10*3/uL (ref 150.0–400.0)
RBC: 4.51 Mil/uL (ref 3.87–5.11)
RDW: 13.9 % (ref 11.5–15.5)
WBC: 6.6 10*3/uL (ref 4.0–10.5)

## 2020-04-25 LAB — TSH: TSH: 0.37 u[IU]/mL (ref 0.35–4.50)

## 2020-04-25 LAB — HEMOGLOBIN A1C: Hgb A1c MFr Bld: 6.1 % (ref 4.6–6.5)

## 2020-04-25 LAB — VITAMIN D 25 HYDROXY (VIT D DEFICIENCY, FRACTURES): VITD: 32.68 ng/mL (ref 30.00–100.00)

## 2020-04-25 MED ORDER — AMLODIPINE BESYLATE 5 MG PO TABS: 5.0000 mg | ORAL_TABLET | Freq: Every day | ORAL | 3 refills | Status: DC

## 2020-04-25 NOTE — Assessment & Plan Note (Signed)
Presume controlled, however pending lipid panel.  Continue Lipitor

## 2020-04-25 NOTE — Telephone Encounter (Signed)
LMTCB

## 2020-04-25 NOTE — Assessment & Plan Note (Signed)
Elevated today.  Reassured by neurologic exam and no signs or symptoms to suggest hypertensive urgency or emergency at this time.  Patient and I had a long discussion in regards to her episode of being "disoriented", and etiology of symptom is nonspecific at this time.  I shared my concern in regards to patient's elevated blood pressure high risk for CVA, MI.  We have discussed signs and symptoms of a CVA and when to proceed to the emergency room.  Advised her if she would ever have another episode as she did last week, to go call 911 or go immediately to ED. patient will resume amlodipine we will titrate medication if needed. she verbalized understanding of all.  Close follow-up

## 2020-04-25 NOTE — Progress Notes (Signed)
Subjective:    Patient ID: Brooke Murphy, female    DOB: 09/21/1961, 59 y.o.   MRN: [id number]  CC: Brooke Murphy is a 59 y.o. female who presents today for follow up.   HPI: Complains of feeling disoriented 4 days ago,completely resolved.  She describes that 'couldn't think of name of medications that she took.'   She feels like 'heat' made her feel 'disoriented'. She had been work inside in the [place] earlier that day and was on her way home when symptom occurred. When she got home, she drank water and took her dose of metoprolol a little earlier than she normally would. She rested and no longer felt disoriented.  Had a left sided 'mild' HA at that time. It was not severe nor the worse HA of her life.  She took tylenol sinus at that time and HA resolved.   No dizziness, vision loss, numbness of extremity, cp.      HTN- taking losartan and lopressor. Not on amlodipine and not sure why she stopped taking.  At home this morning, 145/ 82   DM- no medications. No numbness or wounds in feet.   HLD-compliant with Lipitor     declines pneumovax 23, colonoscopy order today Due eye exam HISTORY:  Past Medical History:  Diagnosis Date  . Brain aneurysm 1999   [facility]  . Diabetes mellitus without complication (HCC)    diet controlled  . GERD (gastroesophageal reflux disease)   . Hyperlipidemia   . Hypertension   . Hyperthyroidism   . Rheumatoid arthritis ([place]) 2002   s/p methotrexate, humira and embrel  . Thyroid nodule   . Ulcer of abdomen wall ([place])   . Vitamin D deficiency    Past Surgical History:  Procedure Laterality Date  . ABDOMINAL HYSTERECTOMY  2005   ovaries remain; WITH CERVIX  . BRAIN SURGERY Left 11/27/1997   aneurysm  . COLONOSCOPY    . LAPAROSCOPIC BILATERAL SALPINGO OOPHERECTOMY  11/11/2018   Procedure: LAPAROSCOPIC BILATERAL SALPINGO OOPHORECTOMY;  Surgeon: Benjaman Kindler, MD;  Location: [facility] ORS;  Service: Gynecology;;   Family History  Problem Relation Age of  Onset  . Hypertension Mother   . Aneurysm Mother 69       died  . Hyperlipidemia Sister   . Hypertension Sister   . Cancer Father 70       throat cancer  . Diabetes Maternal Aunt   . Breast cancer Maternal Aunt 65  . Hypertension Maternal Grandmother   . Stroke Maternal Grandmother   . Colon cancer Neg Hx     Allergies: Celebrex [celecoxib], Lodine [etodolac], and Penicillins Current Outpatient Medications on File Prior to Visit  Medication Sig Dispense Refill  . atorvastatin (LIPITOR) 20 MG tablet TAKE 1 TABLET BY MOUTH EVERY DAY 30 tablet 1  . fluticasone (FLONASE) 50 MCG/ACT nasal spray Place 2 sprays into both nostrils daily. (Patient taking differently: Place 2 sprays into both nostrils daily as needed for allergies. ) 16 g 6  . gabapentin (NEURONTIN) 100 MG capsule TAKE 1 CAPSULE (100 MG TOTAL) BY MOUTH 3 (THREE) TIMES DAILY AS NEEDED (FOR HEADACHE/NERVE PAIN). 90 capsule 0  . glucose blood (ONETOUCH VERIO) test strip Used to check blood sugar once daily. 50 each 3  . ipratropium-albuterol (DUONEB) 0.5-2.5 (3) MG/3ML SOLN Take 3 mLs by nebulization every 4 (four) hours as needed. 360 mL 1  . losartan (COZAAR) 100 MG tablet TAKE 1 TABLET BY MOUTH EVERY DAY 30 tablet 2  . metoprolol tartrate (  LOPRESSOR) 25 MG tablet TAKE 1 TABLET BY MOUTH TWICE A DAY 60 tablet 2  . mirtazapine (REMERON) 30 MG tablet Take 1 tablet (30 mg total) by mouth at bedtime. 90 tablet 1  . omeprazole (PRILOSEC) 20 MG capsule TAKE 1 CAPSULE BY MOUTH EVERY DAY 30 capsule 1  . OneTouch Delica Lancets 22G MISC 1 Stick by Percutaneous route 2 (two) times daily. as directed 100 each 3  . diclofenac sodium (VOLTAREN) 1 % GEL Apply 4 g topically 4 (four) times daily. (Patient not taking: Reported on 04/25/2020) 1 Tube 3   No current facility-administered medications on file prior to visit.    Social History   Tobacco Use  . Smoking status: Former Smoker    Packs/day: 0.90    Years: 37.00    Pack years: 33.30     Types: Cigarettes    Quit date: 09/03/2018    Years since quitting: 1.6  . Smokeless tobacco: Never Used  Vaping Use  . Vaping Use: Never used  Substance Use Topics  . Alcohol use: No    Alcohol/week: 0.0 standard drinks  . Drug use: No    Review of Systems  Constitutional: Negative for chills and fever.  Respiratory: Negative for cough.   Cardiovascular: Negative for chest pain and palpitations.  Gastrointestinal: Negative for nausea and vomiting.  Neurological: Negative for dizziness, speech difficulty, numbness and headaches (resolved).  Psychiatric/Behavioral: Negative for confusion (resolved).      Objective:    BP (!) 165/100   Pulse 61   Temp 98.2 F (36.8 C) (Oral)   Resp 14   Ht 5' 5.75" (1.67 m)   Wt 129 lb 6.4 oz (58.7 kg)   SpO2 99%   BMI 21.04 kg/m  BP Readings from Last 3 Encounters:  04/25/20 (!) 165/100  11/11/18 135/78  11/04/18 (!) 147/92   Wt Readings from Last 3 Encounters:  04/25/20 129 lb 6.4 oz (58.7 kg)  11/11/18 136 lb (61.7 kg)  11/04/18 136 lb (61.7 kg)    Physical Exam Vitals reviewed.  Constitutional:      Appearance: She is well-developed.  HENT:     Mouth/Throat:     Pharynx: Uvula midline.  Eyes:     Conjunctiva/sclera: Conjunctivae normal.     Pupils: Pupils are equal, round, and reactive to light.     Comments: Fundus normal bilaterally.   Cardiovascular:     Rate and Rhythm: Normal rate and regular rhythm.     Pulses: Normal pulses.     Heart sounds: Normal heart sounds.  Pulmonary:     Effort: Pulmonary effort is normal.     Breath sounds: Normal breath sounds. No wheezing, rhonchi or rales.  Skin:    General: Skin is warm and dry.  Neurological:     Mental Status: She is alert.     Cranial Nerves: No cranial nerve deficit.     Sensory: No sensory deficit.     Deep Tendon Reflexes:     Reflex Scores:      Bicep reflexes are 2+ on the right side and 2+ on the left side.      Patellar reflexes are 2+ on the  right side and 2+ on the left side.    Comments: Grip equal and strong bilateral upper extremities. Gait strong and steady. Able to perform rapid alternating movement without difficulty.   Psychiatric:        Speech: Speech normal.        Behavior: Behavior  normal.        Thought Content: Thought content normal.        Assessment & Plan:   Problem List Items Addressed This Visit      Cardiovascular and Mediastinum   HTN (hypertension) - Primary    Elevated today.  Reassured by neurologic exam and no signs or symptoms to suggest hypertensive urgency or emergency at this time.  Patient and I had a long discussion in regards to her episode of being "disoriented", and etiology of symptom is nonspecific at this time.  I shared my concern in regards to patient's elevated blood pressure high risk for CVA, MI.  We have discussed signs and symptoms of a CVA and when to proceed to the emergency room.  Advised her if she would ever have another episode as she did last week, to go call 911 or go immediately to ED. patient will resume amlodipine we will titrate medication if needed. she verbalized understanding of all.  Close follow-up      Relevant Medications   amLODipine (NORVASC) 5 MG tablet   Other Relevant Orders   CBC with Differential/Platelet   TSH   Comprehensive metabolic panel   Hemoglobin A1c   Lipid panel   VITAMIN D 25 Hydroxy (Vit-D Deficiency, Fractures)     Other   HLD (hyperlipidemia)    Presume controlled, however pending lipid panel.  Continue Lipitor      Relevant Medications   amLODipine (NORVASC) 5 MG tablet       I have discontinued Shannette Vowell's bimatoprost and lidocaine. I am also having her maintain her fluticasone, diclofenac sodium, gabapentin, mirtazapine, ipratropium-albuterol, glucose blood, OneTouch Delica Lancets 28N, metoprolol tartrate, losartan, omeprazole, atorvastatin, and amLODipine.   Meds ordered this encounter  Medications  . amLODipine  (NORVASC) 5 MG tablet    Sig: Take 1 tablet (5 mg total) by mouth daily.    Dispense:  90 tablet    Refill:  3    Order Specific Question:   Supervising Provider    Answer:   Crecencio Mc [[id number]]    Return precautions given.   Risks, benefits, and alternatives of the medications and treatment plan prescribed today were discussed, and patient expressed understanding.   Education regarding symptom management and diagnosis given to patient on AVS.  Continue to follow with Burnard Hawthorne, FNP for routine health maintenance.   Talmadge Coventry and I agreed with plan.   [name], FNP

## 2020-04-25 NOTE — Patient Instructions (Signed)
Start amlodipine  If you ever have another episode of feeling disoriented,I strongly advise you to go to emergency room or call 911 to ensure that you are not experiencing a stroke  It is imperative that you are seen AT least twice per year for labs and monitoring. Monitor blood pressure at home and me 5-6 reading on separate days. Goal is less than 120/80, based on newest guidelines, however we certainly want to be less than 130/80;  if persistently higher, please make sooner follow up appointment so we can recheck you blood pressure and manage/ adjust medications.  Please make eye appointment

## 2020-04-25 NOTE — Telephone Encounter (Signed)
Call pt I dont see where she went to ED??

## 2020-04-25 NOTE — Telephone Encounter (Signed)
Left message for patient to notify them that it is time to schedule annual low dose lung cancer screening CT scan. Instructed patient to call back to verify information prior to the scan being scheduled.  

## 2020-04-25 NOTE — Telephone Encounter (Signed)
Spoke with pt and she stated that she did not go to the ED. Pt stated that her bp this morning was 141/82. Pt stated that she is not having any symptoms. Pt also stated that she has an appt with you today at 11 am.

## 2020-04-26 ENCOUNTER — Telehealth: Payer: Self-pay | Admitting: *Deleted

## 2020-04-26 ENCOUNTER — Other Ambulatory Visit: Payer: Self-pay | Admitting: *Deleted

## 2020-04-26 DIAGNOSIS — Z122 Encounter for screening for malignant neoplasm of respiratory organs: Secondary | ICD-10-CM

## 2020-04-26 DIAGNOSIS — Z87891 Personal history of nicotine dependence: Secondary | ICD-10-CM

## 2020-04-26 NOTE — Telephone Encounter (Signed)
Notified patient that she is out of network for lung screening scan at [facility]. Request for scan sent to [facility]. Patient is in agreement with this plan.

## 2020-05-15 ENCOUNTER — Other Ambulatory Visit: Payer: Self-pay | Admitting: Family

## 2020-05-15 DIAGNOSIS — I10 Essential (primary) hypertension: Secondary | ICD-10-CM

## 2020-06-02 ENCOUNTER — Other Ambulatory Visit: Payer: Self-pay | Admitting: Family

## 2020-06-06 ENCOUNTER — Other Ambulatory Visit: Payer: Self-pay | Admitting: Family

## 2020-06-06 DIAGNOSIS — I10 Essential (primary) hypertension: Secondary | ICD-10-CM

## 2020-06-06 DIAGNOSIS — E785 Hyperlipidemia, unspecified: Secondary | ICD-10-CM

## 2020-06-16 ENCOUNTER — Encounter: Payer: No Typology Code available for payment source | Admitting: Family

## 2020-06-16 DIAGNOSIS — Z0289 Encounter for other administrative examinations: Secondary | ICD-10-CM

## 2020-06-27 ENCOUNTER — Other Ambulatory Visit: Payer: BLUE CROSS/BLUE SHIELD

## 2020-06-30 NOTE — Telephone Encounter (Signed)
Confirmed with [facility] that they have received the order for lung screening imaging. They have attempted to contact patient and will follow up.

## 2020-07-03 ENCOUNTER — Other Ambulatory Visit: Payer: Self-pay | Admitting: Family

## 2020-07-07 ENCOUNTER — Other Ambulatory Visit: Payer: Self-pay | Admitting: Family

## 2020-07-07 DIAGNOSIS — I10 Essential (primary) hypertension: Secondary | ICD-10-CM

## 2020-07-07 MED ORDER — LOSARTAN POTASSIUM 100 MG PO TABS: 100.0000 mg | ORAL_TABLET | Freq: Every day | ORAL | 2 refills | Status: DC

## 2020-07-07 NOTE — Addendum Note (Signed)
Addended by: Ezequiel Ganser on: 07/07/2020 02:41 PM   Modules accepted: Orders

## 2020-07-10 ENCOUNTER — Other Ambulatory Visit: Payer: Self-pay

## 2020-07-10 DIAGNOSIS — I10 Essential (primary) hypertension: Secondary | ICD-10-CM

## 2020-07-10 MED ORDER — LOSARTAN POTASSIUM 100 MG PO TABS: 100.0000 mg | ORAL_TABLET | Freq: Every day | ORAL | 2 refills | Status: DC

## 2020-07-23 ENCOUNTER — Other Ambulatory Visit: Payer: Self-pay | Admitting: Family

## 2020-08-06 ENCOUNTER — Other Ambulatory Visit: Payer: Self-pay | Admitting: Family

## 2020-08-06 DIAGNOSIS — I10 Essential (primary) hypertension: Secondary | ICD-10-CM

## 2020-08-06 DIAGNOSIS — E785 Hyperlipidemia, unspecified: Secondary | ICD-10-CM

## 2020-08-12 ENCOUNTER — Encounter: Payer: Self-pay | Admitting: Family

## 2020-08-15 ENCOUNTER — Other Ambulatory Visit: Payer: Self-pay

## 2020-08-15 DIAGNOSIS — I10 Essential (primary) hypertension: Secondary | ICD-10-CM

## 2020-08-15 DIAGNOSIS — F32A Depression, unspecified: Secondary | ICD-10-CM

## 2020-08-15 MED ORDER — MIRTAZAPINE 30 MG PO TABS: 30.0000 mg | ORAL_TABLET | Freq: Every day | ORAL | 0 refills | Status: DC

## 2020-09-09 ENCOUNTER — Other Ambulatory Visit: Payer: Self-pay | Admitting: Family

## 2020-09-09 DIAGNOSIS — I10 Essential (primary) hypertension: Secondary | ICD-10-CM

## 2020-10-01 ENCOUNTER — Encounter: Payer: Self-pay | Admitting: Family

## 2020-10-02 ENCOUNTER — Other Ambulatory Visit: Payer: Self-pay | Admitting: Family

## 2020-10-02 DIAGNOSIS — M546 Pain in thoracic spine: Secondary | ICD-10-CM

## 2020-10-02 MED ORDER — CYCLOBENZAPRINE HCL 5 MG PO TABS: 5.0000 mg | ORAL_TABLET | Freq: Every evening | ORAL | 1 refills | Status: DC | PRN

## 2020-10-11 ENCOUNTER — Other Ambulatory Visit: Payer: Self-pay | Admitting: Family

## 2020-10-11 DIAGNOSIS — I10 Essential (primary) hypertension: Secondary | ICD-10-CM

## 2020-10-11 DIAGNOSIS — E785 Hyperlipidemia, unspecified: Secondary | ICD-10-CM

## 2020-11-02 ENCOUNTER — Other Ambulatory Visit: Payer: Self-pay | Admitting: Family

## 2020-11-02 DIAGNOSIS — I10 Essential (primary) hypertension: Secondary | ICD-10-CM

## 2020-11-02 DIAGNOSIS — E785 Hyperlipidemia, unspecified: Secondary | ICD-10-CM

## 2020-11-07 ENCOUNTER — Other Ambulatory Visit: Payer: Self-pay

## 2020-11-07 ENCOUNTER — Ambulatory Visit (INDEPENDENT_AMBULATORY_CARE_PROVIDER_SITE_OTHER): Payer: 59 | Admitting: Family

## 2020-11-07 ENCOUNTER — Encounter: Payer: Self-pay | Admitting: Family

## 2020-11-07 VITALS — BP 150/92 | HR 59 | Temp 98.1°F | Ht 65.75 in | Wt 139.6 lb

## 2020-11-07 DIAGNOSIS — E049 Nontoxic goiter, unspecified: Secondary | ICD-10-CM

## 2020-11-07 DIAGNOSIS — I10 Essential (primary) hypertension: Secondary | ICD-10-CM

## 2020-11-07 DIAGNOSIS — E119 Type 2 diabetes mellitus without complications: Secondary | ICD-10-CM

## 2020-11-07 DIAGNOSIS — R21 Rash and other nonspecific skin eruption: Secondary | ICD-10-CM | POA: Insufficient documentation

## 2020-11-07 DIAGNOSIS — Z1231 Encounter for screening mammogram for malignant neoplasm of breast: Secondary | ICD-10-CM | POA: Diagnosis not present

## 2020-11-07 DIAGNOSIS — R7303 Prediabetes: Secondary | ICD-10-CM

## 2020-11-07 LAB — CBC WITH DIFFERENTIAL/PLATELET
Basophils Absolute: 0 10*3/uL (ref 0.0–0.1)
Basophils Relative: 0.8 % (ref 0.0–3.0)
Eosinophils Absolute: 0.2 10*3/uL (ref 0.0–0.7)
Eosinophils Relative: 2.6 % (ref 0.0–5.0)
HCT: 43.3 % (ref 36.0–46.0)
Hemoglobin: 14.3 g/dL (ref 12.0–15.0)
Lymphocytes Relative: 52.1 % — ABNORMAL HIGH (ref 12.0–46.0)
Lymphs Abs: 3.2 10*3/uL (ref 0.7–4.0)
MCHC: 33.1 g/dL (ref 30.0–36.0)
MCV: 94 fl (ref 78.0–100.0)
Monocytes Absolute: 0.5 10*3/uL (ref 0.1–1.0)
Monocytes Relative: 7.3 % (ref 3.0–12.0)
Neutro Abs: 2.3 10*3/uL (ref 1.4–7.7)
Neutrophils Relative %: 37.2 % — ABNORMAL LOW (ref 43.0–77.0)
Platelets: 283 10*3/uL (ref 150.0–400.0)
RBC: 4.6 Mil/uL (ref 3.87–5.11)
RDW: 13.9 % (ref 11.5–15.5)
WBC: 6.2 10*3/uL (ref 4.0–10.5)

## 2020-11-07 LAB — TSH: TSH: 0.45 u[IU]/mL (ref 0.35–4.50)

## 2020-11-07 LAB — COMPREHENSIVE METABOLIC PANEL
ALT: 12 U/L (ref 0–35)
AST: 19 U/L (ref 0–37)
Albumin: 4.6 g/dL (ref 3.5–5.2)
Alkaline Phosphatase: 84 U/L (ref 39–117)
BUN: 13 mg/dL (ref 6–23)
CO2: 30 mEq/L (ref 19–32)
Calcium: 9.9 mg/dL (ref 8.4–10.5)
Chloride: 98 mEq/L (ref 96–112)
Creatinine, Ser: 0.73 mg/dL (ref 0.40–1.20)
GFR: 88.85 mL/min (ref >60.00)
Glucose, Bld: 92 mg/dL (ref 70–99)
Potassium: 4.9 mEq/L (ref 3.5–5.1)
Sodium: 134 mEq/L — ABNORMAL LOW (ref 135–145)
Total Bilirubin: 0.6 mg/dL (ref 0.2–1.2)
Total Protein: 7.5 g/dL (ref 6.0–8.3)

## 2020-11-07 LAB — LIPID PANEL
Cholesterol: 214 mg/dL — ABNORMAL HIGH (ref 0–200)
HDL: 81.6 mg/dL (ref >39.00)
LDL Cholesterol: 109 mg/dL — ABNORMAL HIGH (ref 0–99)
NonHDL: 131.98
Total CHOL/HDL Ratio: 3
Triglycerides: 113 mg/dL (ref 0.0–149.0)
VLDL: 22.6 mg/dL (ref 0.0–40.0)

## 2020-11-07 LAB — MICROALBUMIN / CREATININE URINE RATIO
Creatinine,U: 32.3 mg/dL
Microalb Creat Ratio: 3.5 mg/g (ref 0.0–30.0)
Microalb, Ur: 1.1 mg/dL (ref 0.0–1.9)

## 2020-11-07 LAB — HEMOGLOBIN A1C: Hgb A1c MFr Bld: 6.2 % (ref 4.6–6.5)

## 2020-11-07 LAB — VITAMIN D 25 HYDROXY (VIT D DEFICIENCY, FRACTURES): VITD: 29.78 ng/mL — ABNORMAL LOW (ref 30.00–100.00)

## 2020-11-07 MED ORDER — MUPIROCIN 2 % EX OINT: 1.0000 "application " | TOPICAL_OINTMENT | Freq: Two times a day (BID) | CUTANEOUS | 0 refills | Status: DC

## 2020-11-07 NOTE — Progress Notes (Signed)
Subjective:    Patient ID: Brooke Murphy, female    DOB: [birth year], 60 y.o.   MRN: [id number]  CC: Brooke Murphy is a 60 y.o. female who presents today for follow up.   HPI: Complains of right skin lesion x 6 weeks, waxing and waning.  Bothered by lesions , scabs as not healing. 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  Itchy. No purulent discharge,fever, right elbow pain or elbow swelling.  Hasnt tried any medication for this.    Plans to follow with Dr Leafy Ro next year after bilateral oophorectomy, non cancerous.   HTN- compliant amlodipine 5mg , losartan 100mg , metoprolol 25mg  bid No cp, leg swelling. At home BP has been 'good' , it has been 125/80.   DM- diet controlled. Eye exam today.   thyroid nodule- due for follow up.     tdap UTD  Declines colon cancer screening at this time  Due for ct lung cancer screen however she cannot do as insurance will not pay.    HISTORY:  Past Medical History:  Diagnosis Date   Brain aneurysm 1999   [facility]   Diabetes mellitus without complication ([place])    diet controlled   GERD (gastroesophageal reflux disease)    Hyperlipidemia    Hypertension    Hyperthyroidism    Rheumatoid arthritis ([place]) 2002   s/p methotrexate, humira and embrel   Thyroid nodule    Ulcer of abdomen wall ([facility])    Vitamin D deficiency    Past Surgical History:  Procedure Laterality Date   ABDOMINAL HYSTERECTOMY  2005   ovaries remain; WITH CERVIX   BRAIN SURGERY Left 11/27/1997   aneurysm   COLONOSCOPY     LAPAROSCOPIC BILATERAL SALPINGO OOPHERECTOMY  11/11/2018   Procedure: LAPAROSCOPIC BILATERAL SALPINGO OOPHORECTOMY;  Surgeon: Benjaman Kindler, MD;  Location: [facility] ORS;  Service: Gynecology;;   Family History  Problem Relation Age of Onset   Hypertension Mother    Aneurysm Mother 53       died   Hyperlipidemia Sister    Hypertension Sister    Cancer Father 41       throat cancer   Diabetes Maternal Aunt     Breast cancer Maternal Aunt 65   Hypertension Maternal Grandmother    Stroke Maternal Grandmother    Colon cancer Neg Hx     Allergies: Celebrex [celecoxib], Lodine [etodolac], and Penicillins Current Outpatient Medications on File Prior to Visit  Medication Sig Dispense Refill   amLODipine (NORVASC) 5 MG tablet Take 1 tablet (5 mg total) by mouth daily. 90 tablet 3   atorvastatin (LIPITOR) 20 MG tablet TAKE 1 TABLET BY MOUTH EVERY DAY 30 tablet 1   cyclobenzaprine (FLEXERIL) 5 MG tablet Take 1 tablet (5 mg total) by mouth at bedtime as needed for muscle spasms. 30 tablet 1   fluticasone (FLONASE) 50 MCG/ACT nasal spray Place 2 sprays into both nostrils daily. (Patient taking differently: Place 2 sprays into both nostrils daily as needed for allergies.) 16 g 6   gabapentin (NEURONTIN) 100 MG capsule TAKE 1 CAPSULE (100 MG TOTAL) BY MOUTH 3 (THREE) TIMES DAILY AS NEEDED (FOR HEADACHE/NERVE PAIN). 90 capsule 0   glucose blood (ONETOUCH VERIO) test strip Used to check blood sugar once daily. 50 each 3   ipratropium-albuterol (DUONEB) 0.5-2.5 (3) MG/3ML SOLN Take 3 mLs by nebulization every 4 (four) hours as needed. 360 mL 1   losartan (COZAAR) 100 MG tablet TAKE 1 TABLET BY MOUTH EVERY DAY  90 tablet 1   metoprolol tartrate (LOPRESSOR) 25 MG tablet TAKE 1 TABLET BY MOUTH TWICE A DAY 180 tablet 1   mirtazapine (REMERON) 30 MG tablet Take 1 tablet (30 mg total) by mouth at bedtime. 90 tablet 0   omeprazole (PRILOSEC) 20 MG capsule TAKE 1 CAPSULE BY MOUTH EVERY DAY 90 capsule 1   OneTouch Delica Lancets 29N MISC 1 Stick by Percutaneous route 2 (two) times daily. as directed 100 each 3   No current facility-administered medications on file prior to visit.    Social History   Tobacco Use   Smoking status: Former Smoker    Packs/day: 0.90    Years: 37.00    Pack years: 33.30    Types: Cigarettes    Quit date: 09/03/2018    Years since quitting: 2.1   Smokeless tobacco: Never  Used  Vaping Use   Vaping Use: Never used  Substance Use Topics   Alcohol use: No    Alcohol/week: 0.0 standard drinks   Drug use: No    Review of Systems  Constitutional: Negative for chills and fever.  Respiratory: Negative for cough.   Cardiovascular: Negative for chest pain and palpitations.  Gastrointestinal: Negative for nausea and vomiting.  Skin: Positive for rash.      Objective:    BP (!) 150/92 (BP Location: Left Arm, Patient Position: Sitting)    Pulse (!) 59    Temp 98.1 F (36.7 C)    Ht 5' 5.75" (1.67 m)    Wt 139 lb 9.6 oz (63.3 kg)    SpO2 98%    BMI 22.71 kg/m  BP Readings from Last 3 Encounters:  11/07/20 (!) 150/92  04/25/20 (!) 165/100  11/11/18 135/78   Wt Readings from Last 3 Encounters:  11/07/20 139 lb 9.6 oz (63.3 kg)  04/25/20 129 lb 6.4 oz (58.7 kg)  11/11/18 136 lb (61.7 kg)    Physical Exam Vitals reviewed.  Constitutional:      Appearance: She is well-developed and well-nourished.  Eyes:     Conjunctiva/sclera: Conjunctivae normal.  Cardiovascular:     Rate and Rhythm: Normal rate and regular rhythm.     Pulses: Normal pulses.     Heart sounds: Normal heart sounds.  Pulmonary:     Effort: Pulmonary effort is normal.     Breath sounds: Normal breath sounds. No wheezing, rhonchi or rales.  Musculoskeletal:     Right lower leg: No edema.     Left lower leg: No edema.  Skin:    General: Skin is warm and dry.          Comments: Scab noted proximal to right olecranon. No other skin lesions. No edema or pain with flexion, extension of right elbow.   Neurological:     Mental Status: She is alert.  Psychiatric:        Mood and Affect: Mood and affect normal.        Speech: Speech normal.        Behavior: Behavior normal.        Thought Content: Thought content normal.        Assessment & Plan:   Problem List Items Addressed This Visit      Cardiovascular and Mediastinum   HTN (hypertension)    Reports good control at home  and declines escalation of regimen. I have advised her to continue to monitor closely. Continue amlodipine 5mg , losartan 100mg , metoprolol 25mg  bid. Close follow up  Endocrine   Goiter    Lost to follow up for endocrine. She has been contacted by endocrine to schedule and I also provided her with phone number to call to make a follow up appointment.       Relevant Orders   Ambulatory referral to Endocrinology     Musculoskeletal and Integument   Skin rash    No obvious infection. Etiology unclear. Advised trial of bactroban.       Relevant Medications   mupirocin ointment (BACTROBAN) 2 %     Other   Prediabetes - Primary    Lab Results  Component Value Date   HGBA1C 6.2 11/07/2020   Very well controlled.        Other Visit Diagnoses    Encounter for screening mammogram for malignant neoplasm of breast       Relevant Orders   MM 3D SCREEN BREAST BILATERAL       I have discontinued Brooke Murphy's diclofenac sodium. I am also having her start on mupirocin ointment. Additionally, I am having her maintain her fluticasone, gabapentin, ipratropium-albuterol, glucose blood, OneTouch Delica Lancets 18A, amLODipine, metoprolol tartrate, omeprazole, mirtazapine, losartan, cyclobenzaprine, and atorvastatin.   Meds ordered this encounter  Medications   mupirocin ointment (BACTROBAN) 2 %    Sig: Apply 1 application topically 2 (two) times daily.    Dispense:  22 g    Refill:  0    Order Specific Question:   Supervising Provider    Answer:   Crecencio Mc [[id number]]    Return precautions given.   Risks, benefits, and alternatives of the medications and treatment plan prescribed today were discussed, and patient expressed understanding.   Education regarding symptom management and diagnosis given to patient on AVS.  Continue to follow with Burnard Hawthorne, FNP for routine health maintenance.   Talmadge Coventry and I agreed with plan.   [name], FNP

## 2020-11-07 NOTE — Patient Instructions (Addendum)
Trial of bactroban ointment for skin lesions; let me know if no improvement.   Call your insurance company regarding CT lung cancer screen and then call to schedule your CT Chest for lung cancer screening. You are overdue for this. [place] [phone]  Call GYN Dr Leafy Ro to schedule follow up - [phone]  Call Endocrine for follow up for thyroid nodules-  [phone] It is imperative that you are seen AT least twice per year for labs and monitoring. Monitor blood pressure at home and me 5-6 reading on separate days. Goal is less than 120/80, based on newest guidelines, however we certainly want to be less than 130/80;  if persistently higher, please make sooner follow up appointment so we can recheck you blood pressure and manage/ adjust medications.

## 2020-11-08 ENCOUNTER — Encounter: Payer: Self-pay | Admitting: Family

## 2020-11-09 ENCOUNTER — Telehealth: Payer: Self-pay | Admitting: Family

## 2020-11-09 NOTE — Telephone Encounter (Signed)
Rejection Reason - Other - Established patient of Malissa Hippo, NP, no referral needed. Patient cancelled ultrasound and follow up appointment in 2020 and declined to reschedule. Patient will be contacted to reschedule these appointments. Please ask patient to contact our office at [phone] to reschedule these appointments." Brooke Murphy said on Nov 09, 2020 10:04 AM  [facility]

## 2020-11-10 NOTE — Assessment & Plan Note (Signed)
No obvious infection. Etiology unclear. Advised trial of bactroban.

## 2020-11-10 NOTE — Assessment & Plan Note (Signed)
Lab Results  Component Value Date   HGBA1C 6.2 11/07/2020   Very well controlled.

## 2020-11-10 NOTE — Assessment & Plan Note (Signed)
Lost to follow up for endocrine. She has been contacted by endocrine to schedule and I also provided her with phone number to call to make a follow up appointment.

## 2020-11-10 NOTE — Assessment & Plan Note (Signed)
Reports good control at home and declines escalation of regimen. I have advised her to continue to monitor closely. Continue amlodipine 5mg , losartan 100mg , metoprolol 25mg  bid. Close follow up

## 2020-11-21 ENCOUNTER — Other Ambulatory Visit: Payer: Self-pay | Admitting: Family

## 2020-11-29 ENCOUNTER — Other Ambulatory Visit: Payer: Self-pay | Admitting: Family

## 2020-11-29 DIAGNOSIS — E785 Hyperlipidemia, unspecified: Secondary | ICD-10-CM

## 2020-11-29 DIAGNOSIS — I10 Essential (primary) hypertension: Secondary | ICD-10-CM

## 2020-12-01 ENCOUNTER — Other Ambulatory Visit: Payer: Self-pay | Admitting: Family

## 2020-12-01 DIAGNOSIS — I10 Essential (primary) hypertension: Secondary | ICD-10-CM

## 2020-12-01 DIAGNOSIS — F32A Depression, unspecified: Secondary | ICD-10-CM

## 2020-12-04 ENCOUNTER — Ambulatory Visit: Payer: BLUE CROSS/BLUE SHIELD | Admitting: Family

## 2020-12-13 ENCOUNTER — Other Ambulatory Visit: Payer: Self-pay | Admitting: Family

## 2020-12-13 DIAGNOSIS — E785 Hyperlipidemia, unspecified: Secondary | ICD-10-CM

## 2020-12-13 DIAGNOSIS — I10 Essential (primary) hypertension: Secondary | ICD-10-CM

## 2021-01-26 ENCOUNTER — Other Ambulatory Visit: Payer: Self-pay | Admitting: Family

## 2021-01-26 DIAGNOSIS — I10 Essential (primary) hypertension: Secondary | ICD-10-CM

## 2021-02-03 ENCOUNTER — Other Ambulatory Visit: Payer: Self-pay | Admitting: Family

## 2021-02-03 DIAGNOSIS — M546 Pain in thoracic spine: Secondary | ICD-10-CM

## 2021-02-03 DIAGNOSIS — I10 Essential (primary) hypertension: Secondary | ICD-10-CM

## 2021-02-05 ENCOUNTER — Other Ambulatory Visit: Payer: Self-pay

## 2021-02-05 ENCOUNTER — Ambulatory Visit (INDEPENDENT_AMBULATORY_CARE_PROVIDER_SITE_OTHER): Payer: 59 | Admitting: Family

## 2021-02-05 ENCOUNTER — Encounter: Payer: Self-pay | Admitting: Family

## 2021-02-05 VITALS — BP 128/78 | HR 65 | Temp 97.6°F | Ht 65.75 in | Wt 136.4 lb

## 2021-02-05 DIAGNOSIS — R7303 Prediabetes: Secondary | ICD-10-CM | POA: Diagnosis not present

## 2021-02-05 DIAGNOSIS — Z122 Encounter for screening for malignant neoplasm of respiratory organs: Secondary | ICD-10-CM

## 2021-02-05 DIAGNOSIS — E049 Nontoxic goiter, unspecified: Secondary | ICD-10-CM

## 2021-02-05 DIAGNOSIS — I10 Essential (primary) hypertension: Secondary | ICD-10-CM

## 2021-02-05 DIAGNOSIS — R21 Rash and other nonspecific skin eruption: Secondary | ICD-10-CM | POA: Diagnosis not present

## 2021-02-05 DIAGNOSIS — Z1211 Encounter for screening for malignant neoplasm of colon: Secondary | ICD-10-CM

## 2021-02-05 DIAGNOSIS — I700 Atherosclerosis of aorta: Secondary | ICD-10-CM

## 2021-02-05 LAB — POCT GLYCOSYLATED HEMOGLOBIN (HGB A1C): Hemoglobin A1C: 5.8 % — AB (ref 4.0–5.6)

## 2021-02-05 MED ORDER — TRIAMCINOLONE ACETONIDE 0.025 % EX CREA: 1.0000 "application " | TOPICAL_CREAM | Freq: Two times a day (BID) | CUTANEOUS | 2 refills | Status: DC

## 2021-02-05 NOTE — Assessment & Plan Note (Signed)
Chronic, stable. Continue lipitor 20mg . Pending updating lipid panel.

## 2021-02-05 NOTE — Assessment & Plan Note (Signed)
No follow up with endocrine. Advised again the importance of this and have provided phone number. Patient understands importance of calling.

## 2021-02-05 NOTE — Assessment & Plan Note (Addendum)
Unchanged. 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 Trial of triamcinolone cream and she will stop bactroban. Referral placed and patient will let me know of any changes or new concerns ahead of appointment.

## 2021-02-05 NOTE — Progress Notes (Signed)
Subjective:    Patient ID: Brooke Murphy, female    DOB: [birth year], 60 y.o.   MRN: [id number]  CC: Brooke Murphy is a 60 y.o. female who presents today for follow up.   HPI: Feels well today  No new concerns  Rash - continues to have purple colored dime to quarter size lesions on bilateral forearms. Less dry than they had been. Not increasing. bactroban was not helpful . Itchy. Non tender.  No drainage. No fever, unusual joint pain.   HTN- at home 120/70.  compliant with amlodipine 5mg , losartan 100mg , metoprolol 25mg  bid. No cp, sob.    She has not scheduled follow up with endocrine, Dr Ladell Pier  Prediabetes- she is avoiding fried foods.   Colonoscopy and mammogram due  HLD- compliant with lipitor 20mg       Consult with Malissa Hippo 08/2018 for thyroid nodule.   HISTORY:  Past Medical History:  Diagnosis Date  . Brain aneurysm 1999   [facility]  . GERD (gastroesophageal reflux disease)   . Hyperlipidemia   . Hypertension   . Hyperthyroidism   . Rheumatoid arthritis ([place]) 2002   s/p methotrexate, humira and embrel  . Thyroid nodule   . Ulcer of abdomen wall ([place])   . Vitamin D deficiency    Past Surgical History:  Procedure Laterality Date  . ABDOMINAL HYSTERECTOMY  2005   ovaries remain; WITH CERVIX  . BRAIN SURGERY Left 11/27/1997   aneurysm  . COLONOSCOPY    . LAPAROSCOPIC BILATERAL SALPINGO OOPHERECTOMY  11/11/2018   Procedure: LAPAROSCOPIC BILATERAL SALPINGO OOPHORECTOMY;  Surgeon: Benjaman Kindler, MD;  Location: [facility] ORS;  Service: Gynecology;;   Family History  Problem Relation Age of Onset  . Hypertension Mother   . Aneurysm Mother 75       died  . Hyperlipidemia Sister   . Hypertension Sister   . Cancer Father 70       throat cancer  . Diabetes Maternal Aunt   . Breast cancer Maternal Aunt 65  . Hypertension Maternal Grandmother   . Stroke Maternal Grandmother   . Colon cancer Neg Hx     Allergies: Celebrex [celecoxib], Lodine [etodolac], and  Penicillins Current Outpatient Medications on File Prior to Visit  Medication Sig Dispense Refill  . amLODipine (NORVASC) 5 MG tablet Take 1 tablet (5 mg total) by mouth daily. 90 tablet 3  . atorvastatin (LIPITOR) 20 MG tablet TAKE 1 TABLET BY MOUTH EVERY DAY 90 tablet 1  . fluticasone (FLONASE) 50 MCG/ACT nasal spray Place 2 sprays into both nostrils daily. (Patient taking differently: Place 2 sprays into both nostrils daily as needed for allergies.) 16 g 6  . gabapentin (NEURONTIN) 100 MG capsule TAKE 1 CAPSULE (100 MG TOTAL) BY MOUTH 3 (THREE) TIMES DAILY AS NEEDED (FOR HEADACHE/NERVE PAIN). 90 capsule 0  . glucose blood (ONETOUCH VERIO) test strip Used to check blood sugar once daily. 50 each 3  . ipratropium-albuterol (DUONEB) 0.5-2.5 (3) MG/3ML SOLN Take 3 mLs by nebulization every 4 (four) hours as needed. 360 mL 1  . metoprolol tartrate (LOPRESSOR) 25 MG tablet TAKE 1 TABLET BY MOUTH TWICE A DAY 180 tablet 1  . mirtazapine (REMERON) 30 MG tablet TAKE 1 TABLET BY MOUTH AT BEDTIME. 30 tablet 2  . mupirocin ointment (BACTROBAN) 2 % Apply 1 application topically 2 (two) times daily. 22 g 0  . omeprazole (PRILOSEC) 20 MG capsule TAKE 1 CAPSULE BY MOUTH EVERY DAY 90 capsule 1  . OneTouch Delica Lancets 30Q MISC  1 Stick by Percutaneous route 2 (two) times daily. as directed 100 each 3  . cyclobenzaprine (FLEXERIL) 5 MG tablet TAKE 1 TABLET BY MOUTH AT BEDTIME AS NEEDED FOR MUSCLE SPASMS. 30 tablet 1  . losartan (COZAAR) 100 MG tablet TAKE 1 TABLET BY MOUTH EVERY DAY 90 tablet 1   No current facility-administered medications on file prior to visit.    Social History   Tobacco Use  . Smoking status: Former Smoker    Packs/day: 0.90    Years: 37.00    Pack years: 33.30    Types: Cigarettes    Quit date: 09/03/2018    Years since quitting: 2.4  . Smokeless tobacco: Never Used  Vaping Use  . Vaping Use: Never used  Substance Use Topics  . Alcohol use: No    Alcohol/week: 0.0 standard  drinks  . Drug use: No    Review of Systems  Constitutional: Negative for chills and fever.  Respiratory: Negative for cough.   Cardiovascular: Negative for chest pain and palpitations.  Gastrointestinal: Negative for nausea and vomiting.  Musculoskeletal: Negative for arthralgias.  Skin: Positive for rash.      Objective:    BP 128/78 (BP Location: Left Arm, Patient Position: Sitting, Cuff Size: Large)   Pulse 65   Temp 97.6 F (36.4 C) (Oral)   Ht 5' 5.75" (1.67 m)   Wt 136 lb 6.4 oz (61.9 kg)   SpO2 99%   BMI 22.18 kg/m  BP Readings from Last 3 Encounters:  02/05/21 128/78  11/07/20 (!) 150/92  04/25/20 (!) 165/100   Wt Readings from Last 3 Encounters:  02/05/21 136 lb 6.4 oz (61.9 kg)  11/07/20 139 lb 9.6 oz (63.3 kg)  04/25/20 129 lb 6.4 oz (58.7 kg)    Physical Exam Vitals reviewed.  Constitutional:      Appearance: She is well-developed.  Eyes:     Conjunctiva/sclera: Conjunctivae normal.  Cardiovascular:     Rate and Rhythm: Normal rate and regular rhythm.     Pulses: Normal pulses.     Heart sounds: Normal heart sounds.  Pulmonary:     Effort: Pulmonary effort is normal.     Breath sounds: Normal breath sounds. No wheezing, rhonchi or rales.  Skin:    General: Skin is warm and dry.     Findings: Rash present. Rash is macular.          Comments: Dime to quarter size macular lesions scattered bilateral forearms. Purple to brown in color. No scaling. Non fluctuant nor discharge noted.   Neurological:     Mental Status: She is alert.  Psychiatric:        Speech: Speech normal.        Behavior: Behavior normal.        Thought Content: Thought content normal.        Assessment & Plan:   Problem List Items Addressed This Visit      Cardiovascular and Mediastinum   Aortic atherosclerosis (HCC)    Chronic, stable. Continue lipitor 20mg . Pending updating lipid panel.       HTN (hypertension)    Stable. Continue amlodipine 5mg , losartan 100mg ,  metoprolol 25mg  bid.      Relevant Orders   Comprehensive metabolic panel   CBC with Differential/Platelet     Endocrine   Goiter    No follow up with endocrine. Advised again the importance of this and have provided phone number. Patient understands importance of calling.  Musculoskeletal and Integument   Skin rash    Unchanged. 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 Trial of triamcinolone cream and she will stop bactroban. Referral placed and patient will let me know of any changes or new concerns ahead of appointment.       Relevant Medications   triamcinolone (KENALOG) 0.025 % cream   Other Relevant Orders   Ambulatory referral to Dermatology     Other   Prediabetes - Primary   Relevant Orders   POCT HgB A1C (Completed)   Lipid panel    Other Visit Diagnoses    Screen for colon cancer       Relevant Orders   Ambulatory referral to Gastroenterology   Screening for lung cancer       Relevant Orders   CT CHEST LUNG CA SCREEN LOW DOSE W/O CM       I am having Talmadge Coventry start on triamcinolone. I am also having her maintain her fluticasone, gabapentin, ipratropium-albuterol, glucose blood, OneTouch Delica Lancets 48J, amLODipine, mupirocin ointment, omeprazole, mirtazapine, atorvastatin, and metoprolol tartrate.   Meds ordered this encounter  Medications  . triamcinolone (KENALOG) 0.025 % cream    Sig: Apply 1 application topically 2 (two) times daily.    Dispense:  15 g    Refill:  2    Order Specific Question:   Supervising Provider    Answer:   Crecencio Mc [[id number]]    Return precautions given.   Risks, benefits, and alternatives of the medications and treatment plan prescribed today were discussed, and patient expressed understanding.   Education regarding symptom management and diagnosis given to patient on AVS.  Continue to follow with Burnard Hawthorne, FNP for routine health maintenance.    Talmadge Coventry and I agreed with plan.   [name], FNP

## 2021-02-05 NOTE — Assessment & Plan Note (Signed)
Stable. Continue amlodipine 5mg , losartan 100mg , metoprolol 25mg  bid.

## 2021-02-05 NOTE — Patient Instructions (Addendum)
Again, very important that you schedule with Endocrine for follow up Please call  Malissa Hippo [phone]  Referral for CT lung cancer screen, colonoscopy and dermatology consult Let us know if you dont hear back within a week in regards to an appointment being scheduled.   Let me know if any concerns

## 2021-02-21 ENCOUNTER — Other Ambulatory Visit: Payer: Self-pay

## 2021-02-21 ENCOUNTER — Other Ambulatory Visit: Payer: 59

## 2021-02-21 ENCOUNTER — Ambulatory Visit (INDEPENDENT_AMBULATORY_CARE_PROVIDER_SITE_OTHER): Payer: 59 | Admitting: Dermatology

## 2021-02-21 DIAGNOSIS — D485 Neoplasm of uncertain behavior of skin: Secondary | ICD-10-CM | POA: Diagnosis not present

## 2021-02-21 DIAGNOSIS — D492 Neoplasm of unspecified behavior of bone, soft tissue, and skin: Secondary | ICD-10-CM

## 2021-02-21 MED ORDER — CLOBETASOL PROPIONATE 0.05 % EX OINT: 1.0000 "application " | TOPICAL_OINTMENT | Freq: Two times a day (BID) | CUTANEOUS | 0 refills | Status: DC

## 2021-02-21 NOTE — Progress Notes (Signed)
   New Patient Visit  Subjective  Brooke Murphy is a 60 y.o. female who presents for the following: growths (R arm, back, feet, itchy, ~35yr, pt had truama to R arm and then the lesions started, pt used Mupirocin oint and TMC 0.025% cr, no hx of sarcoid, no hx of breathing problems).  New patient referral from [name], [place].  The following portions of the chart were reviewed this encounter and updated as appropriate:       Review of Systems:  No other skin or systemic complaints except as noted in HPI or Assessment and Plan.  Objective  Well appearing patient in no apparent distress; mood and affect are within normal limits.  A focused examination was performed including bil arms, back, buttocks, face. Relevant physical exam findings are noted in the Assessment and Plan.  Objective  R forearm: Hyperpigmented annular shiny plaques and paps R > L arm, L ankle, gluteal cleft, L lower lip          Assessment & Plan  Neoplasm of skin R forearm  Skin / nail biopsy Type of biopsy: punch   Informed consent: discussed and consent obtained   Anesthesia: the lesion was anesthetized in a standard fashion   Anesthesia comment:  Area prepped with alcohol Anesthetic:  1% lidocaine w/ epinephrine 1-100,000 buffered w/ 8.4% NaHCO3 Punch size:  3.5 mm Suture size:  4-0 Suture type: nylon   Hemostasis achieved with: suture and pressure   Outcome: patient tolerated procedure well   Post-procedure details: wound care instructions given   Post-procedure details comment:  Ointment and small bandage applied  clobetasol ointment (TEMOVATE) 0.05 %  Specimen 1 - Surgical pathology Differential Diagnosis: D48.5 Sarcoid vs DLE vs Hypertrophic Lichen Planus  Check Margins: No Hyperpigmented annular shiny plaques and paps R > L arm, L ankle, gluteal cleft, L lower lip  Sarcoid vrs DLE vrs Hypertrophic LP vrs other  Start Clobetasol oint bid aa arms, legs, buttock crease until clear  then prn flares, avoid f/g/a  Topical steroids (such as triamcinolone, fluocinolone, fluocinonide, mometasone, clobetasol, halobetasol, betamethasone, hydrocortisone) can cause thinning and lightening of the skin if they are used for too long in the same area. Your physician has selected the right strength medicine for your problem and area affected on the body. Please use your medication only as directed by your physician to prevent side effects.    Return in about 1 week (around 02/28/2021) for bx f/u and suture removal.  I, Sonya Hupman, RMA, am acting as scribe for Brendolyn Patty, MD .  Documentation: I have reviewed the above documentation for accuracy and completeness, and I agree with the above.  Brendolyn Patty MD

## 2021-02-21 NOTE — Patient Instructions (Addendum)
If you have any questions or concerns for your doctor, please call our main line at [phone] and press option 4 to reach your doctor's medical assistant. If no one answers, please leave a voicemail as directed and we will return your call as soon as possible. Messages left after 4 pm will be answered the following business day.   You may also send us a message via MyChart. We typically respond to MyChart messages within 1-2 business days.  For prescription refills, please ask your pharmacy to contact our office. Our fax number is [phone].  If you have an urgent issue when the clinic is closed that cannot wait until the next business day, you can page your doctor at the number below.    Please note that while we do our best to be available for urgent issues outside of office hours, we are not available 24/7.   If you have an urgent issue and are unable to reach us, you may choose to seek medical care at your doctor's office, retail clinic, urgent care center, or emergency room.  If you have a medical emergency, please immediately call 911 or go to the emergency department.  Pager Numbers  - Dr. Kowalski: [phone]  - Dr. Moye: [phone]  - Dr. Stewart: [phone]  In the event of inclement weather, please call our main line at [phone] for an update on the status of any delays or closures.  Dermatology Medication Tips: Please keep the boxes that topical medications come in in order to help keep track of the instructions about where and how to use these. Pharmacies typically print the medication instructions only on the boxes and not directly on the medication tubes.   If your medication is too expensive, please contact our office at [phone] option 4 or send us a message through MyChart.   We are unable to tell what your co-pay for medications will be in advance as this is different depending on your insurance coverage. However, we may be able to find a  substitute medication at lower cost or fill out paperwork to get insurance to cover a needed medication.   If a prior authorization is required to get your medication covered by your insurance company, please allow us 1-2 business days to complete this process.  Drug prices often vary depending on where the prescription is filled and some pharmacies may offer cheaper prices.  The website www.goodrx.com contains coupons for medications through different pharmacies. The prices here do not account for what the cost may be with help from insurance (it may be cheaper with your insurance), but the website can give you the price if you did not use any insurance.  - You can print the associated coupon and take it with your prescription to the pharmacy.  - You may also stop by our office during regular business hours and pick up a GoodRx coupon card.  - If you need your prescription sent electronically to a different pharmacy, notify our office through [place] MyChart or by phone at [phone] option 4.     Wound Care Instructions  1. Cleanse wound gently with soap and water once a day then pat dry with clean gauze. Apply a thing coat of Petrolatum (petroleum jelly, "Vaseline") over the wound (unless you have an allergy to this). We recommend that you use a new, sterile tube of Vaseline. Do not pick or remove scabs. Do not remove the yellow or white "healing tissue" from the base of the wound.    2. Cover the wound with fresh, clean, nonstick gauze and secure with paper tape. You may use Band-Aids in place of gauze and tape if the would is small enough, but would recommend trimming much of the tape off as there is often too much. Sometimes Band-Aids can irritate the skin.  3. You should call the office for your biopsy report after 1 week if you have not already been contacted.  4. If you experience any problems, such as abnormal amounts of bleeding, swelling, significant bruising, significant pain,  or evidence of infection, please call the office immediately.  5. FOR ADULT SURGERY PATIENTS: If you need something for pain relief you may take 1 extra strength Tylenol (acetaminophen) AND 2 Ibuprofen (200mg each) together every 4 hours as needed for pain. (do not take these if you are allergic to them or if you have a reason you should not take them.) Typically, you may only need pain medication for 1 to 3 days.     

## 2021-02-22 ENCOUNTER — Other Ambulatory Visit: Payer: Self-pay | Admitting: *Deleted

## 2021-02-22 DIAGNOSIS — Z122 Encounter for screening for malignant neoplasm of respiratory organs: Secondary | ICD-10-CM

## 2021-02-22 DIAGNOSIS — Z87891 Personal history of nicotine dependence: Secondary | ICD-10-CM

## 2021-02-22 NOTE — Progress Notes (Signed)
Contacted and scheduled for annual lung screening scan. Patient is a former smoker, quit 01/22/19, 33.3 pack year.

## 2021-02-28 ENCOUNTER — Other Ambulatory Visit: Payer: Self-pay

## 2021-02-28 ENCOUNTER — Ambulatory Visit (INDEPENDENT_AMBULATORY_CARE_PROVIDER_SITE_OTHER): Payer: 59 | Admitting: Dermatology

## 2021-02-28 DIAGNOSIS — D492 Neoplasm of unspecified behavior of bone, soft tissue, and skin: Secondary | ICD-10-CM | POA: Diagnosis not present

## 2021-02-28 DIAGNOSIS — L439 Lichen planus, unspecified: Secondary | ICD-10-CM | POA: Diagnosis not present

## 2021-02-28 MED ORDER — CLOBETASOL PROPIONATE 0.05 % EX OINT: 1.0000 "application " | TOPICAL_OINTMENT | Freq: Two times a day (BID) | CUTANEOUS | 1 refills | Status: DC

## 2021-02-28 NOTE — Patient Instructions (Addendum)
Lichen Planus Lichen planus is a skin problem. It causes redness, itching, small bumps, and sores. Areas of the body that are often affected include:  Arms, wrists, legs, or ankles.  Chest, back, or belly (abdomen).  Genital areas such as the vulva and vagina.  Gums and inside of the mouth.  Scalp.  Fingernails or toenails. Treatment can help to control symptoms. This condition can last for a long time. What are the causes? The exact cause of this condition is not known. It is not passed from one person to another (not contagious). What increases the risk? You are more likely to get this condition if you:  Are older than 62 years of age.  Take certain medicines.  Have been around or had contact with certain dyes or chemicals.  Have hepatitis C.  Are a woman. What are the signs or symptoms? Symptoms of this condition may include:  Itching. This can be very bad.  Small red or purple bumps on the skin. These may have flat tops and may be round or irregular shaped.  Redness or white patches on the gums or tongue.  Redness, soreness, or a burning feeling in the genital area. This may lead to pain or bleeding during sex.  Changes in the fingernails or toenails. The nails may become thin or rough. They may have ridges in them.  Redness or irritation of the eyes. This is rare.   How is this diagnosed? This condition may be diagnosed with:  A physical exam. The doctor will look at your affected skin and check for changes inside your mouth.  Removal of a sample of your skin to look at it under a microscope (biopsy). How is this treated? Treatment may depend on your symptoms. In some cases, no treatment is needed. If treatment is needed, it may include:  Creams or ointments (topical steroids) to help with itching and irritation.  Medicine to be taken by mouth.  Medicine to be taken by a shot (injection).  A treatment in which your skin is exposed to a type of light  (phototherapy).  Lozenges that you suck on to help treat sores in the mouth. Follow these instructions at home:  Take or use over-the-counter and prescription medicines only as told by your doctor.  Use creams or ointments as told by your doctor.  Do not scratch the affected areas of skin.  If you are a woman, keep the vagina as clean and dry as you can.  If you have sores in your mouth, avoid: ? Spicy and acidic foods. ? Alcohol. ? Tobacco.  Keep all follow-up visits as told by your doctor. This is important.   Contact a doctor if:  Your redness, swelling, or pain gets worse.  You have fluid, blood, or pus coming from the affected area.  Your eyes become irritated. Summary  Lichen planus is a skin problem. It causes redness, itching, small bumps, and sores.  Do not scratch the affected areas of skin.  Take or use over-the-counter and prescription medicines only as told by your doctor.  Keep all follow-up visits as told by your doctor. This is important. This information is not intended to replace advice given to you by your health care provider. Make sure you discuss any questions you have with your health care provider. Document Revised: 01/21/2018 Document Reviewed: 01/21/2018 Elsevier Patient Education  2021 [place] may also stop by our office during regular business hours and pick up a GoodRx coupon card.  -  If you need your prescription sent electronically to a different pharmacy, notify our office through [facility] or by phone at [phone] option 4.   If you have any questions or concerns for your doctor, please call our main line at [phone] and press option 4 to reach your doctor's medical assistant. If no one answers, please leave a voicemail as directed and we will return your call as soon as possible. Messages left after 4 pm will be answered the following business day.   You may also send [place] a message via [place]. We typically  respond to MyChart messages within 1-2 business days.  For prescription refills, please ask your pharmacy to contact our office. Our fax number is [phone].  If you have an urgent issue when the clinic is closed that cannot wait until the next business day, you can page your doctor at the number below.    Please note that while we do our best to be available for urgent issues outside of office hours, we are not available 24/7.   If you have an urgent issue and are unable to reach [place], you may choose to seek medical care at your doctor's office, retail clinic, urgent care center, or emergency room.  If you have a medical emergency, please immediately call 911 or go to the emergency department.  Pager Numbers  - Dr. Nehemiah Massed: [phone]  - Dr. Laurence Ferrari: [phone]  - Dr. Nicole Kindred: [phone]  In the event of inclement weather, please call our main line at [phone] for an update on the status of any delays or closures.  Dermatology Medication Tips: Please keep the boxes that topical medications come in in order to help keep track of the instructions about where and how to use these. Pharmacies typically print the medication instructions only on the boxes and not directly on the medication tubes.   If your medication is too expensive, please contact our office at [phone] option 4 or send [place] a message through [place].   We are unable to tell what your co-pay for medications will be in advance as this is different depending on your insurance coverage. However, we may be able to find a substitute medication at lower cost or fill out paperwork to get insurance to cover a needed medication.   If a prior authorization is required to get your medication covered by your insurance company, please allow [place] 1-2 business days to complete this process.  Drug prices often vary depending on where the prescription is filled and some pharmacies may offer cheaper prices.  The website  www.goodrx.com contains coupons for medications through different pharmacies. The prices here do not account for what the cost may be with help from insurance (it may be cheaper with your insurance), but the website can give you the price if you did not use any insurance.  - You can print the associated coupon and take it with your prescription to the pharmacy.

## 2021-02-28 NOTE — Progress Notes (Signed)
   Follow-Up Visit   Subjective  Brooke Murphy is a 60 y.o. female who presents for the following: Follow-up (Lichen Planus, biopsy proven. Patient is improving using clobetasol ointment BID. ). No history of hepatitis.  The following portions of the chart were reviewed this encounter and updated as appropriate:        Review of Systems:  No other skin or systemic complaints except as noted in HPI or Assessment and Plan.  Objective  Well appearing patient in no apparent distress; mood and affect are within normal limits.  A focused examination was performed including arms, legs, face. Relevant physical exam findings are noted in the Assessment and Plan.  arms, L ankle, gluteal cleft, lower lip Hyperpigmented shiny plaques with thinning on the right forearm; hypo and hyperpigmented patches on wrists and hands and L ankle; dyspigmented macule of the left lower lip; white reticulation on bilateral buccal mucosa.   Assessment & Plan  Lichen planus arms, L ankle, gluteal cleft, lower lip  Biopsy proven. Discussed chronic condition. Control is goal, not cure.  Pt improving on topical clobetasol.  Wound cleansed, sutures removed, bandage applied.  Discussed pathology results. Advised patient chronic condition, inflammation in the skin. No history of hepatitis. Unknown etiology  Continue Clobetasol ointment BID to AAs prn dsp 30g 1Rf.  Discussed IL steroid injection if areas not improving with topical.  Topical steroids (such as triamcinolone, fluocinolone, fluocinonide, mometasone, clobetasol, halobetasol, betamethasone, hydrocortisone) can cause thinning and lightening of the skin if they are used for too long in the same area. Your physician has selected the right strength medicine for your problem and area affected on the body. Please use your medication only as directed by your physician to prevent side effects.      Neoplasm of skin  Related Medications clobetasol ointment  (TEMOVATE) 7.82 % Apply 1 application topically 2 (two) times daily. Bid aa itchy rash on arms, legs and buttocks until clear then prn flares, avoid face, groin, axilla  Return in about 2 months (around 05/28/6212) for lichen planus.   IJamesetta Orleans, CMA, am acting as scribe for Brendolyn Patty, MD .  Documentation: I have reviewed the above documentation for accuracy and completeness, and I agree with the above.  Brendolyn Patty MD

## 2021-03-02 ENCOUNTER — Ambulatory Visit
Admission: RE | Admit: 2021-03-02 | Discharge: 2021-03-02 | Disposition: A | Discharge status: Home / Self Care | Payer: 59 | Source: Ambulatory Visit | Attending: Nurse Practitioner | Admitting: Nurse Practitioner

## 2021-03-02 ENCOUNTER — Other Ambulatory Visit: Payer: Self-pay

## 2021-03-02 ENCOUNTER — Other Ambulatory Visit (INDEPENDENT_AMBULATORY_CARE_PROVIDER_SITE_OTHER): Payer: 59

## 2021-03-02 DIAGNOSIS — I10 Essential (primary) hypertension: Secondary | ICD-10-CM

## 2021-03-02 DIAGNOSIS — R7303 Prediabetes: Secondary | ICD-10-CM

## 2021-03-02 DIAGNOSIS — Z87891 Personal history of nicotine dependence: Secondary | ICD-10-CM | POA: Diagnosis present

## 2021-03-02 DIAGNOSIS — Z122 Encounter for screening for malignant neoplasm of respiratory organs: Secondary | ICD-10-CM | POA: Diagnosis present

## 2021-03-02 LAB — LIPID PANEL
Cholesterol: 162 mg/dL (ref 0–200)
HDL: 60.7 mg/dL (ref >39.00)
LDL Cholesterol: 73 mg/dL (ref 0–99)
NonHDL: 100.99
Total CHOL/HDL Ratio: 3
Triglycerides: 140 mg/dL (ref 0.0–149.0)
VLDL: 28 mg/dL (ref 0.0–40.0)

## 2021-03-02 LAB — CBC WITH DIFFERENTIAL/PLATELET
Basophils Absolute: 0.1 10*3/uL (ref 0.0–0.1)
Basophils Relative: 0.9 % (ref 0.0–3.0)
Eosinophils Absolute: 0.2 10*3/uL (ref 0.0–0.7)
Eosinophils Relative: 3.2 % (ref 0.0–5.0)
HCT: 40.8 % (ref 36.0–46.0)
Hemoglobin: 13.7 g/dL (ref 12.0–15.0)
Lymphocytes Relative: 33.9 % (ref 12.0–46.0)
Lymphs Abs: 2.5 10*3/uL (ref 0.7–4.0)
MCHC: 33.7 g/dL (ref 30.0–36.0)
MCV: 94.3 fl (ref 78.0–100.0)
Monocytes Absolute: 0.5 10*3/uL (ref 0.1–1.0)
Monocytes Relative: 7.2 % (ref 3.0–12.0)
Neutro Abs: 4.1 10*3/uL (ref 1.4–7.7)
Neutrophils Relative %: 54.8 % (ref 43.0–77.0)
Platelets: 312 10*3/uL (ref 150.0–400.0)
RBC: 4.33 Mil/uL (ref 3.87–5.11)
RDW: 14.6 % (ref 11.5–15.5)
WBC: 7.5 10*3/uL (ref 4.0–10.5)

## 2021-03-02 LAB — COMPREHENSIVE METABOLIC PANEL
ALT: 12 U/L (ref 0–35)
AST: 19 U/L (ref 0–37)
Albumin: 4.5 g/dL (ref 3.5–5.2)
Alkaline Phosphatase: 68 U/L (ref 39–117)
BUN: 19 mg/dL (ref 6–23)
CO2: 27 mEq/L (ref 19–32)
Calcium: 9.7 mg/dL (ref 8.4–10.5)
Chloride: 100 mEq/L (ref 96–112)
Creatinine, Ser: 0.8 mg/dL (ref 0.40–1.20)
GFR: 79.42 mL/min (ref >60.00)
Glucose, Bld: 91 mg/dL (ref 70–99)
Potassium: 4.4 mEq/L (ref 3.5–5.1)
Sodium: 137 mEq/L (ref 135–145)
Total Bilirubin: 0.9 mg/dL (ref 0.2–1.2)
Total Protein: 7.4 g/dL (ref 6.0–8.3)

## 2021-03-05 ENCOUNTER — Telehealth: Payer: Self-pay | Admitting: *Deleted

## 2021-03-05 NOTE — Telephone Encounter (Signed)
Pulmonary opinion 

## 2021-03-06 ENCOUNTER — Telehealth: Payer: Self-pay | Admitting: *Deleted

## 2021-03-06 ENCOUNTER — Other Ambulatory Visit: Payer: Self-pay | Admitting: *Deleted

## 2021-03-06 ENCOUNTER — Other Ambulatory Visit: Payer: Self-pay | Admitting: Nurse Practitioner

## 2021-03-06 DIAGNOSIS — R911 Solitary pulmonary nodule: Secondary | ICD-10-CM

## 2021-03-06 NOTE — Telephone Encounter (Signed)
Recommend PET CT. Further determination pending PET CT. Has extensive emphysema and may not be surgical candidate. Pulm. consult also recommended.

## 2021-03-06 NOTE — Telephone Encounter (Signed)
Call pt Rec'ed note from [facility] regarding CT lung and new nodule  PET scan scheduled  Ct again shows atherosclerosis, please advise to continue lipitor

## 2021-03-06 NOTE — Progress Notes (Signed)
Brooke Murphy, 60 y.o. female who was referred to the lung cancer screening program/low-dose CT for current use of tobacco products.  she had a low-dose chest CT on 03/03/21 which noted irregular solid nodule in the lateral segment right middle lobe measuring 8.5 mm.  Other pulmonary nodules measuring 10.8 mm or less in size similar to previous.  No pleural fluid.  Additional pulmonary nodules measuring 6.5 mm or less.  Concerning for primary bronchogenic carcinoma/underlying malignancy.  Case was discussed with Dr. Patsey Berthold, who recommends PET scan to evaluate further and assist in medical decision-making including possible biopsy and to optimize management. Patient has been referred to [facility].

## 2021-03-06 NOTE — Telephone Encounter (Signed)
After obtaining recommendation from pulmonary, patient notified of Ct scan results and recommendation for Pet scan. Patient agrees with this plan and will await appt to be scheduled when authorization is obtained.   IMPRESSION: 1. Lung-RADS 4B, suspicious. Additional imaging evaluation or consultation with Pulmonology or Thoracic Surgery recommended. New irregular solid nodule in the lateral segment right middle lobe. These results will be called to the ordering clinician or representative by the Radiologist Assistant, and communication documented in the PACS or [employer]. 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 3. Aortic atherosclerosis (ICD10-I70.0). Coronary artery calcification.

## 2021-03-06 NOTE — Telephone Encounter (Signed)
Notified notified & aware.

## 2021-03-21 ENCOUNTER — Ambulatory Visit
Admission: RE | Admit: 2021-03-21 | Discharge: 2021-03-21 | Disposition: A | Discharge status: Home / Self Care | Payer: 59 | Source: Ambulatory Visit | Attending: Nurse Practitioner | Admitting: Nurse Practitioner

## 2021-03-21 ENCOUNTER — Other Ambulatory Visit: Payer: Self-pay

## 2021-03-21 DIAGNOSIS — Z87891 Personal history of nicotine dependence: Secondary | ICD-10-CM | POA: Insufficient documentation

## 2021-03-21 DIAGNOSIS — R911 Solitary pulmonary nodule: Secondary | ICD-10-CM | POA: Diagnosis not present

## 2021-03-21 DIAGNOSIS — Z122 Encounter for screening for malignant neoplasm of respiratory organs: Secondary | ICD-10-CM | POA: Insufficient documentation

## 2021-03-21 DIAGNOSIS — J432 Centrilobular emphysema: Secondary | ICD-10-CM | POA: Insufficient documentation

## 2021-03-21 LAB — GLUCOSE, CAPILLARY: Glucose-Capillary: 91 mg/dL (ref 70–99)

## 2021-03-21 MED ORDER — FLUDEOXYGLUCOSE F - 18 (FDG) INJECTION
7.0700 | Freq: Once | INTRAVENOUS | Status: AC | PRN
  Administered 2021-03-21: 7.07 via INTRAVENOUS

## 2021-03-30 ENCOUNTER — Institutional Professional Consult (permissible substitution): Payer: 59 | Admitting: Pulmonary Disease

## 2021-04-04 ENCOUNTER — Ambulatory Visit (INDEPENDENT_AMBULATORY_CARE_PROVIDER_SITE_OTHER): Payer: 59 | Admitting: Pulmonary Disease

## 2021-04-04 ENCOUNTER — Encounter: Payer: Self-pay | Admitting: Pulmonary Disease

## 2021-04-04 ENCOUNTER — Other Ambulatory Visit: Payer: Self-pay

## 2021-04-04 VITALS — BP 138/82 | HR 63 | Temp 97.8°F | Ht 65.0 in | Wt 137.2 lb

## 2021-04-04 DIAGNOSIS — Z87891 Personal history of nicotine dependence: Secondary | ICD-10-CM

## 2021-04-04 DIAGNOSIS — R918 Other nonspecific abnormal finding of lung field: Secondary | ICD-10-CM

## 2021-04-04 DIAGNOSIS — J449 Chronic obstructive pulmonary disease, unspecified: Secondary | ICD-10-CM | POA: Diagnosis not present

## 2021-04-04 MED ORDER — ALBUTEROL SULFATE HFA 108 (90 BASE) MCG/ACT IN AERS: 2.0000 | INHALATION_SPRAY | Freq: Four times a day (QID) | RESPIRATORY_TRACT | 2 refills | Status: DC | PRN

## 2021-04-04 NOTE — Patient Instructions (Addendum)
We are going to put you back on the lung cancer screening program.  He will have another CT in 6 months.  We are scheduling breathing test.  We are giving you a prescription of albuterol which is a medication that you can use as needed for shortness of breath or when you have your bouts of cough.  We will see her in follow-up in 3 months time call sooner should any new problems arise.  We will call you with the results of the breathing test as it comes along.

## 2021-04-04 NOTE — Progress Notes (Signed)
Subjective:    Patient ID: Brooke Murphy, female    DOB: June 04, 1961, 60 y.o.   MRN: [id number] Chief Complaint  Patient presents with   pulmonary consult    Recent PET. C/o dry cough.    HPI The patient is a 60 year old former smoker (quit 2020, 25 PY) who presents for evaluation of a pulmonary nodule on lung cancer screening CT.  She is kindly referred by [name], [place].  Patient was not sure why she was being seen.  We discussed findings on the lung cancer screening CT.  We also discussed her recent PET/CT performed on 29 June.  Films were shown to the patient as well.  Most salient is significant bullous emphysema.  There is an irregular solid nodule in the lateral segment of the right lower lobe measuring 8.5 mm.  There are prior pulmonary nodules noted from before that are unchanged.  The patient underwent PET/CT as noted above there is no significant metabolic activity associated with any of the pulmonary nodules including the new right middle lobe pulmonary nodule.  No evidence of malignancy elsewhere.  He does have of course extensive centrilobular emphysema.  The patient quit smoking in 2020 and she has not relapsed.  She has noted dry cough which has plagued her over the last few years.  This is one of the reasons why she quit smoking.  She has not been on any inhalers and does not note that these help her cough.  Not endorse shortness of breath, chest pain, palpitations or any other symptomatology.  She has occasional lower extremity edema which is relieved by elevating her feet.  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  Review of Systems A 10 point review of systems was performed and it is as noted above otherwise negative.  Past Medical History:  Diagnosis Date   Brain aneurysm 1999   [facility]   GERD (gastroesophageal reflux disease)    Hyperlipidemia    Hypertension    Hyperthyroidism    Rheumatoid arthritis ([place]) 2002   s/p methotrexate, humira and  embrel   Thyroid nodule    Ulcer of abdomen wall (HCC)    Vitamin D deficiency    Past Surgical History:  Procedure Laterality Date   ABDOMINAL HYSTERECTOMY  2005   ovaries remain; WITH CERVIX   BRAIN SURGERY Left 11/27/1997   aneurysm   COLONOSCOPY     LAPAROSCOPIC BILATERAL SALPINGO OOPHERECTOMY  11/11/2018   Procedure: LAPAROSCOPIC BILATERAL SALPINGO OOPHORECTOMY;  Surgeon: Benjaman Kindler, MD;  Location: [facility] ORS;  Service: Gynecology;;   Family History  Problem Relation Age of Onset   Hypertension Mother    Aneurysm Mother 31       died   Hyperlipidemia Sister    Hypertension Sister    Cancer Father 16       throat cancer   Diabetes Maternal Aunt    Breast cancer Maternal Aunt 65   Hypertension Maternal Grandmother    Stroke Maternal Grandmother    Colon cancer Neg Hx    Social History   Tobacco Use   Smoking status: Former    Packs/day: 1.00    Years: 37.00    Pack years: 37.00    Types: Cigarettes    Quit date: 09/03/2018    Years since quitting: 2.5   Smokeless tobacco: Never  Substance Use Topics   Alcohol use: No    Alcohol/week: 0.0 standard drinks   Allergies  Allergen Reactions   Celebrex [Celecoxib]  Rash    Rash. Upset stomach   Lodine [Etodolac] Rash    Rash. Upset stomach   Penicillins Rash    Has patient had a PCN reaction causing immediate rash, facial/tongue/throat swelling, SOB or lightheadedness with hypotension: Yes Has patient had a PCN reaction causing severe rash involving mucus membranes or skin necrosis: No Has patient had a PCN reaction that required hospitalization: No Has patient had a PCN reaction occurring within the last 10 years: No If all of the above answers are "NO", then may proceed with Cephalosporin use.   Current Meds  Medication Sig   amLODipine (NORVASC) 5 MG tablet Take 1 tablet (5 mg total) by mouth daily.   atorvastatin (LIPITOR) 20 MG tablet TAKE 1 TABLET BY MOUTH EVERY DAY   clobetasol ointment (TEMOVATE) 6.83  % Apply 1 application topically 2 (two) times daily. Bid aa itchy rash on arms, legs and buttocks until clear then prn flares, avoid face, groin, axilla   cyclobenzaprine (FLEXERIL) 5 MG tablet TAKE 1 TABLET BY MOUTH AT BEDTIME AS NEEDED FOR MUSCLE SPASMS.   fluticasone (FLONASE) 50 MCG/ACT nasal spray Place 2 sprays into both nostrils daily. (Patient taking differently: Place 2 sprays into both nostrils daily as needed for allergies.)   gabapentin (NEURONTIN) 100 MG capsule TAKE 1 CAPSULE (100 MG TOTAL) BY MOUTH 3 (THREE) TIMES DAILY AS NEEDED (FOR HEADACHE/NERVE PAIN).   glucose blood (ONETOUCH VERIO) test strip Used to check blood sugar once daily.   ipratropium-albuterol (DUONEB) 0.5-2.5 (3) MG/3ML SOLN Take 3 mLs by nebulization every 4 (four) hours as needed.   losartan (COZAAR) 100 MG tablet TAKE 1 TABLET BY MOUTH EVERY DAY   metoprolol tartrate (LOPRESSOR) 25 MG tablet TAKE 1 TABLET BY MOUTH TWICE A DAY   mirtazapine (REMERON) 30 MG tablet TAKE 1 TABLET BY MOUTH AT BEDTIME.   mupirocin ointment (BACTROBAN) 2 % Apply 1 application topically 2 (two) times daily.   omeprazole (PRILOSEC) 20 MG capsule TAKE 1 CAPSULE BY MOUTH EVERY DAY   OneTouch Delica Lancets 41D MISC 1 Stick by Percutaneous route 2 (two) times daily. as directed   triamcinolone (KENALOG) 0.025 % cream Apply 1 application topically 2 (two) times daily.   Immunization History  Administered Date(s) Administered   PFIZER(Purple Top)SARS-COV-2 Vaccination 05/24/2020, 06/15/2020      Objective:   Physical Exam BP 138/82 (BP Location: Left Arm, Cuff Size: Normal)   Pulse 63   Temp 97.8 F (36.6 C) (Temporal)   Ht 5\' 5"  (1.651 m)   Wt 137 lb 3.2 oz (62.2 kg)   SpO2 100%   BMI 22.83 kg/m  GENERAL: Well-developed well-nourished woman, no acute distress.  Fully ambulatory.  No conversational dyspnea.  Occasional clearing of the throat.  Occasional dry cough noted. HEAD: Normocephalic, atraumatic.  EYES: Pupils equal,  round, reactive to light.  No scleral icterus.  MOUTH: Nose/mouth/throat not examined due to masking requirements for COVID 19. NECK: Supple. No thyromegaly. Trachea midline. No JVD.  No adenopathy. PULMONARY: Good air entry bilaterally.  No adventitious sounds. CARDIOVASCULAR: S1 and S2. Regular rate and rhythm.  No rubs, murmurs or gallops heard. ABDOMEN: Benign. MUSCULOSKELETAL: No joint deformity, no clubbing, no edema.  NEUROLOGIC: No focal deficit, no gait disturbance, speech is fluent. SKIN: Intact,warm,dry. PSYCH: Mood and behavior normal.  Representative image from lung cancer screening CT performed 02 March 2021:   Lesion did not show FDG activity on PET/CT.  Other findings were those of severe bullous emphysema.  Assessment & Plan:     ICD-10-CM   1. COPD suggested by initial evaluation [facility])  J44.9 Pulmonary Function Test [facility] Only   Appears to have significant emphysema Query bronchospastic component PFTs Albuterol for as needed use    2. Multiple lung nodules on CT  R91.8    New lung nodule has no FDG activity Will need to continue to follow Low-dose follow-up CT scan in 6 months    3. Former heavy cigarette smoker (20-39 per day)  Z87.891    Enrolled in lung cancer screening No relapse     Orders Placed This Encounter  Procedures   Pulmonary Function Test [facility] Only    Standing Status:   Future    Standing Expiration Date:   04/04/2022    Scheduling Instructions:     Aug 2022    Order Specific Question:   Full PFT: includes the following: basic spirometry, spirometry pre & post bronchodilator, diffusion capacity (DLCO), lung volumes    Answer:   Full PFT   Meds ordered this encounter  Medications   albuterol (VENTOLIN HFA) 108 (90 Base) MCG/ACT inhaler    Sig: Inhale 2 puffs into the lungs every 6 (six) hours as needed for wheezing or shortness of breath (or cough).    Dispense:  8 g    Refill:  2   The patient has COPD likely on the basis of  emphysema.  Appears to also likely have perhaps a bronchitis/reactive airways component.  This is likely manifested as cough.  Will obtain PFTs to further evaluate this issue.  We will start with as needed albuterol.  Pending PFTs will determine what type of maintenance inhaler she may need.  We will see her in follow-up in she is to contact us prior to that time should any new difficulties arise.  She will need repeat low-dose CT scan as lung cancer screening follow-up in 6 months.   Renold Don, MD [facility]   *This note was dictated using voice recognition software/Dragon.  Despite best efforts to proofread, errors can occur which can change the meaning.  Any change was purely unintentional.

## 2021-04-30 ENCOUNTER — Other Ambulatory Visit: Payer: Self-pay | Admitting: Family

## 2021-04-30 DIAGNOSIS — I10 Essential (primary) hypertension: Secondary | ICD-10-CM

## 2021-05-01 ENCOUNTER — Ambulatory Visit: Payer: 59 | Admitting: Dermatology

## 2021-05-08 ENCOUNTER — Ambulatory Visit: Payer: 59 | Admitting: Family

## 2021-05-11 ENCOUNTER — Telehealth: Payer: Self-pay

## 2021-05-11 NOTE — Telephone Encounter (Signed)
Patient states she has no way of transportation but she is not going to cancel just yet until she knows for sure.

## 2021-05-16 ENCOUNTER — Other Ambulatory Visit: Admission: RE | Admit: 2021-05-16 | Payer: 59 | Source: Ambulatory Visit

## 2021-05-17 ENCOUNTER — Ambulatory Visit: Payer: 59 | Attending: Pulmonary Disease

## 2021-05-17 NOTE — Telephone Encounter (Signed)
Per Annia Belt, patient did not show for covid test or PFT.  Rodena Piety, please reschedule. Thanks

## 2021-05-23 ENCOUNTER — Ambulatory Visit: Payer: 59 | Admitting: Family

## 2021-05-30 ENCOUNTER — Ambulatory Visit: Payer: 59 | Admitting: Dermatology

## 2021-06-10 ENCOUNTER — Other Ambulatory Visit: Payer: Self-pay | Admitting: Family

## 2021-06-10 DIAGNOSIS — I10 Essential (primary) hypertension: Secondary | ICD-10-CM

## 2021-06-10 DIAGNOSIS — E785 Hyperlipidemia, unspecified: Secondary | ICD-10-CM

## 2021-06-25 ENCOUNTER — Telehealth: Payer: Self-pay

## 2021-06-25 NOTE — Telephone Encounter (Signed)
error 

## 2021-06-25 NOTE — Telephone Encounter (Signed)
Called and spoke to patient about upcoming Covid test. Patient states she does not have transportation, she will give [place] a call at the end of the day to see if she needs to reschedule.

## 2021-06-26 ENCOUNTER — Other Ambulatory Visit: Admission: RE | Admit: 2021-06-26 | Payer: 59 | Source: Ambulatory Visit

## 2021-06-27 ENCOUNTER — Ambulatory Visit: Payer: 59 | Attending: Pulmonary Disease

## 2021-06-27 NOTE — Progress Notes (Deleted)
Placed call to patient [phone] to inquire about COVID test scheduled for 10.4.22. Patient states,"I left voicemail for Pulmonary Function Test need to reschedule due to transportation issues."

## 2021-09-18 ENCOUNTER — Other Ambulatory Visit: Payer: Self-pay | Admitting: Family

## 2021-09-18 DIAGNOSIS — I10 Essential (primary) hypertension: Secondary | ICD-10-CM

## 2021-10-05 ENCOUNTER — Other Ambulatory Visit: Payer: Self-pay | Admitting: Family

## 2021-10-05 DIAGNOSIS — I10 Essential (primary) hypertension: Secondary | ICD-10-CM

## 2021-10-05 DIAGNOSIS — E785 Hyperlipidemia, unspecified: Secondary | ICD-10-CM

## 2021-10-19 ENCOUNTER — Encounter (HOSPITAL_COMMUNITY): Payer: Self-pay | Admitting: Radiology

## 2021-10-23 ENCOUNTER — Telehealth: Payer: Self-pay | Admitting: Acute Care

## 2021-10-23 NOTE — Telephone Encounter (Signed)
Left detailed message of pt to call back to schedule f/u lung screening CT scan.

## 2022-01-03 ENCOUNTER — Other Ambulatory Visit: Payer: Self-pay | Admitting: Family

## 2022-01-03 ENCOUNTER — Telehealth: Payer: Self-pay | Admitting: *Deleted

## 2022-01-03 DIAGNOSIS — I10 Essential (primary) hypertension: Secondary | ICD-10-CM

## 2022-02-21 NOTE — Telephone Encounter (Signed)
Opened in error

## 2022-03-04 ENCOUNTER — Telehealth: Payer: Self-pay | Admitting: *Deleted

## 2022-03-04 NOTE — Telephone Encounter (Signed)
Left another message for pt to call back to schedule f/u lung screening CT scan.  ?

## 2022-03-05 ENCOUNTER — Other Ambulatory Visit: Payer: Self-pay

## 2022-03-05 DIAGNOSIS — R911 Solitary pulmonary nodule: Secondary | ICD-10-CM

## 2022-03-05 DIAGNOSIS — Z87891 Personal history of nicotine dependence: Secondary | ICD-10-CM

## 2022-03-07 ENCOUNTER — Encounter: Payer: Self-pay | Admitting: Family

## 2022-03-07 ENCOUNTER — Ambulatory Visit (INDEPENDENT_AMBULATORY_CARE_PROVIDER_SITE_OTHER): Payer: 59 | Admitting: Family

## 2022-03-07 VITALS — BP 130/80 | HR 60 | Temp 98.2°F | Ht 65.0 in | Wt 139.0 lb

## 2022-03-07 DIAGNOSIS — E785 Hyperlipidemia, unspecified: Secondary | ICD-10-CM

## 2022-03-07 DIAGNOSIS — R7303 Prediabetes: Secondary | ICD-10-CM | POA: Diagnosis not present

## 2022-03-07 DIAGNOSIS — Z1211 Encounter for screening for malignant neoplasm of colon: Secondary | ICD-10-CM

## 2022-03-07 DIAGNOSIS — I10 Essential (primary) hypertension: Secondary | ICD-10-CM | POA: Diagnosis not present

## 2022-03-07 DIAGNOSIS — Z1231 Encounter for screening mammogram for malignant neoplasm of breast: Secondary | ICD-10-CM

## 2022-03-07 NOTE — Progress Notes (Signed)
Subjective:    Patient ID: Brooke Murphy, female    DOB: 1961-09-23, 61 y.o.   MRN: [id number]  CC: Brooke Murphy is a 61 y.o. female who presents today for follow up.   HPI: Feels well today No new complaints     Following with pulmonology, Dr. Patsey Berthold. CT lung cancer screen scheduled 03/15/22 HLD-compliant with Lipitor 20 mg Hypertension-compliant with amlodipine '5mg'$ ,losartan '100mg'$ , metoprolol '25mg'$  bid.  No cp, sob   HISTORY:  Past Medical History:  Diagnosis Date   Brain aneurysm 1999   [facility]   GERD (gastroesophageal reflux disease)    Hyperlipidemia    Hypertension    Hyperthyroidism    Rheumatoid arthritis ([place]) 2002   s/p methotrexate, humira and embrel   Thyroid nodule    Ulcer of abdomen wall (HCC)    Vitamin D deficiency    Past Surgical History:  Procedure Laterality Date   ABDOMINAL HYSTERECTOMY  2005   ovaries remain; WITH CERVIX   BRAIN SURGERY Left 11/27/1997   aneurysm   COLONOSCOPY     LAPAROSCOPIC BILATERAL SALPINGO OOPHERECTOMY  11/11/2018   Procedure: LAPAROSCOPIC BILATERAL SALPINGO OOPHORECTOMY;  Surgeon: Benjaman Kindler, MD;  Location: [facility] ORS;  Service: Gynecology;;   Family History  Problem Relation Age of Onset   Hypertension Mother    Aneurysm Mother 24       died   Hyperlipidemia Sister    Hypertension Sister    Cancer Father 74       throat cancer   Diabetes Maternal Aunt    Breast cancer Maternal Aunt 65   Hypertension Maternal Grandmother    Stroke Maternal Grandmother    Colon cancer Neg Hx     Allergies: Celebrex [celecoxib], Lodine [etodolac], and Penicillins Current Outpatient Medications on File Prior to Visit  Medication Sig Dispense Refill   albuterol (VENTOLIN HFA) 108 (90 Base) MCG/ACT inhaler Inhale 2 puffs into the lungs every 6 (six) hours as needed for wheezing or shortness of breath (or cough). 8 g 2   amLODipine (NORVASC) 5 MG tablet TAKE 1 TABLET BY MOUTH EVERY DAY 90 tablet 1   atorvastatin (LIPITOR) 20 MG  tablet TAKE 1 TABLET BY MOUTH EVERY DAY 90 tablet 1   cyclobenzaprine (FLEXERIL) 5 MG tablet TAKE 1 TABLET BY MOUTH AT BEDTIME AS NEEDED FOR MUSCLE SPASMS. 30 tablet 1   fluticasone (FLONASE) 50 MCG/ACT nasal spray Place 2 sprays into both nostrils daily. (Patient taking differently: Place 2 sprays into both nostrils daily as needed for allergies.) 16 g 6   glucose blood (ONETOUCH VERIO) test strip Used to check blood sugar once daily. 50 each 3   ipratropium-albuterol (DUONEB) 0.5-2.5 (3) MG/3ML SOLN Take 3 mLs by nebulization every 4 (four) hours as needed. 360 mL 1   losartan (COZAAR) 100 MG tablet TAKE 1 TABLET BY MOUTH EVERY DAY 90 tablet 1   metoprolol tartrate (LOPRESSOR) 25 MG tablet TAKE 1 TABLET BY MOUTH TWICE A DAY 180 tablet 1   mirtazapine (REMERON) 30 MG tablet TAKE 1 TABLET BY MOUTH AT BEDTIME. 30 tablet 2   omeprazole (PRILOSEC) 20 MG capsule TAKE 1 CAPSULE BY MOUTH EVERY DAY 90 capsule 1   OneTouch Delica Lancets 34H MISC 1 Stick by Percutaneous route 2 (two) times daily. as directed 100 each 3   clobetasol ointment (TEMOVATE) 9.62 % Apply 1 application topically 2 (two) times daily. Bid aa itchy rash on arms, legs and buttocks until clear then prn flares, avoid face, groin, axilla (Patient  not taking: Reported on 03/07/2022) 30 g 1   gabapentin (NEURONTIN) 100 MG capsule TAKE 1 CAPSULE (100 MG TOTAL) BY MOUTH 3 (THREE) TIMES DAILY AS NEEDED (FOR HEADACHE/NERVE PAIN). (Patient not taking: Reported on 03/07/2022) 90 capsule 0   mupirocin ointment (BACTROBAN) 2 % Apply 1 application topically 2 (two) times daily. (Patient not taking: Reported on 03/07/2022) 22 g 0   triamcinolone (KENALOG) 0.025 % cream Apply 1 application topically 2 (two) times daily. (Patient not taking: Reported on 03/07/2022) 15 g 2   No current facility-administered medications on file prior to visit.    Social History   Tobacco Use   Smoking status: Former    Packs/day: 1.00    Years: 37.00    Total pack  years: 37.00    Types: Cigarettes    Quit date: 09/03/2018    Years since quitting: 3.5   Smokeless tobacco: Never  Vaping Use   Vaping Use: Never used  Substance Use Topics   Alcohol use: No    Alcohol/week: 0.0 standard drinks of alcohol   Drug use: No    Review of Systems  Constitutional:  Negative for chills and fever.  Respiratory:  Negative for cough.   Cardiovascular:  Negative for chest pain and palpitations.  Gastrointestinal:  Negative for nausea and vomiting.      Objective:    BP 130/80   Pulse 60   Temp 98.2 F (36.8 C) (Oral)   Ht '5\' 5"'$  (1.651 m)   Wt 139 lb (63 kg)   SpO2 99%   BMI 23.13 kg/m  BP Readings from Last 3 Encounters:  03/07/22 130/80  04/04/21 138/82  02/05/21 128/78   Wt Readings from Last 3 Encounters:  03/07/22 139 lb (63 kg)  04/04/21 137 lb 3.2 oz (62.2 kg)  03/02/21 136 lb (61.7 kg)    Physical Exam Vitals reviewed.  Constitutional:      Appearance: She is well-developed.  Eyes:     Conjunctiva/sclera: Conjunctivae normal.  Cardiovascular:     Rate and Rhythm: Normal rate and regular rhythm.     Pulses: Normal pulses.     Heart sounds: Normal heart sounds.  Pulmonary:     Effort: Pulmonary effort is normal.     Breath sounds: Normal breath sounds. No wheezing, rhonchi or rales.  Skin:    General: Skin is warm and dry.  Neurological:     Mental Status: She is alert.  Psychiatric:        Speech: Speech normal.        Behavior: Behavior normal.        Thought Content: Thought content normal.        Assessment & Plan:   Problem List Items Addressed This Visit       Cardiovascular and Mediastinum   HTN (hypertension)    Chronic, stable.  Continue amlodipine '5mg'$ ,losartan '100mg'$ , metoprolol '25mg'$  bid.       Relevant Orders   Comprehensive metabolic panel   TSH     Other   HLD (hyperlipidemia)    Pending lipid panel.  Anticipate controlled.  Continue Lipitor 20 mg      Relevant Orders   Comprehensive  metabolic panel   Lipid panel   Prediabetes   Relevant Orders   Hemoglobin A1c   VITAMIN D 25 Hydroxy (Vit-D Deficiency, Fractures)   CBC with Differential/Platelet   Other Visit Diagnoses     Screen for colon cancer    -  Primary   Relevant  Orders   Ambulatory referral to Gastroenterology   Encounter for screening mammogram for malignant neoplasm of breast       Relevant Orders   MM 3D SCREEN BREAST BILATERAL        I am having Talmadge Coventry maintain her fluticasone, gabapentin, ipratropium-albuterol, glucose blood, OneTouch Delica Lancets 45Y, mupirocin ointment, mirtazapine, metoprolol tartrate, cyclobenzaprine, triamcinolone, clobetasol ointment, albuterol, omeprazole, losartan, atorvastatin, and amLODipine.   No orders of the defined types were placed in this encounter.   Return precautions given.   Risks, benefits, and alternatives of the medications and treatment plan prescribed today were discussed, and patient expressed understanding.   Education regarding symptom management and diagnosis given to patient on AVS.  Continue to follow with Burnard Hawthorne, FNP for routine health maintenance.   Talmadge Coventry and I agreed with plan.   [name], FNP

## 2022-03-07 NOTE — Patient Instructions (Signed)
Mammogram as scheduled I placed a referral to colonoscopy Let us know if you dont hear back within a week in regards to an appointment being scheduled.

## 2022-03-08 NOTE — Assessment & Plan Note (Signed)
Chronic, stable.  Continue amlodipine '5mg'$ ,losartan '100mg'$ , metoprolol '25mg'$  bid.

## 2022-03-08 NOTE — Assessment & Plan Note (Signed)
Pending lipid panel.  Anticipate controlled.  Continue Lipitor 20 mg

## 2022-03-15 ENCOUNTER — Ambulatory Visit: Admission: RE | Admit: 2022-03-15 | Payer: 59 | Source: Ambulatory Visit

## 2022-03-15 ENCOUNTER — Other Ambulatory Visit: Payer: Self-pay | Admitting: *Deleted

## 2022-03-15 ENCOUNTER — Ambulatory Visit: Payer: 59

## 2022-03-15 DIAGNOSIS — Z87891 Personal history of nicotine dependence: Secondary | ICD-10-CM

## 2022-03-15 DIAGNOSIS — Z122 Encounter for screening for malignant neoplasm of respiratory organs: Secondary | ICD-10-CM

## 2022-03-29 ENCOUNTER — Ambulatory Visit
Admission: RE | Admit: 2022-03-29 | Discharge: 2022-03-29 | Disposition: A | Discharge status: Home / Self Care | Payer: 59 | Source: Ambulatory Visit | Attending: Acute Care | Admitting: Acute Care

## 2022-03-29 DIAGNOSIS — Z122 Encounter for screening for malignant neoplasm of respiratory organs: Secondary | ICD-10-CM | POA: Diagnosis present

## 2022-03-29 DIAGNOSIS — Z87891 Personal history of nicotine dependence: Secondary | ICD-10-CM | POA: Diagnosis present

## 2022-03-30 ENCOUNTER — Other Ambulatory Visit: Payer: Self-pay | Admitting: Family

## 2022-03-30 DIAGNOSIS — I10 Essential (primary) hypertension: Secondary | ICD-10-CM

## 2022-04-01 ENCOUNTER — Telehealth: Payer: Self-pay | Admitting: Family

## 2022-04-01 NOTE — Telephone Encounter (Signed)
Call pt She needs fasting labs appt 2-3 days ahead of cpe with me  I want to review results of CT lung cancer screen with her as I recommend a GI consult due to gastric wall thickening seen and that she is due for colonoscopy  Atherosclerosis seen on CT lung and I recommend further evaluation by cardiology.    Ask her if okay with referral to GI and referral to cardiology  We can discuss more in office when she is here

## 2022-04-01 NOTE — Telephone Encounter (Signed)
Spoke to patient in regards to notes and scheduled  a CPE appt and fasting labs and patient and was agreeable to referral to G1 and to Cardiology

## 2022-04-02 ENCOUNTER — Other Ambulatory Visit: Payer: Self-pay | Admitting: Family

## 2022-04-02 ENCOUNTER — Other Ambulatory Visit: Payer: Self-pay

## 2022-04-02 ENCOUNTER — Telehealth: Payer: Self-pay

## 2022-04-02 DIAGNOSIS — Z1211 Encounter for screening for malignant neoplasm of colon: Secondary | ICD-10-CM

## 2022-04-02 DIAGNOSIS — Z86010 Personal history of colonic polyps: Secondary | ICD-10-CM

## 2022-04-02 DIAGNOSIS — Z87891 Personal history of nicotine dependence: Secondary | ICD-10-CM

## 2022-04-02 DIAGNOSIS — F17210 Nicotine dependence, cigarettes, uncomplicated: Secondary | ICD-10-CM

## 2022-04-02 DIAGNOSIS — Z122 Encounter for screening for malignant neoplasm of respiratory organs: Secondary | ICD-10-CM

## 2022-04-02 DIAGNOSIS — I700 Atherosclerosis of aorta: Secondary | ICD-10-CM

## 2022-04-02 MED ORDER — NA SULFATE-K SULFATE-MG SULF 17.5-3.13-1.6 GM/177ML PO SOLN: 1.0000 | Freq: Once | ORAL | 0 refills | Status: AC

## 2022-04-02 NOTE — Telephone Encounter (Signed)
Dr Vicente Males,  I wanted you to be aware that I have referred this patient to you for colonoscopy as due and no prior record available.   I also noted on referral an incidental finding seen on recent CT chest for lung cancer screening 03/29/22 which showed   Apparent wall thickening is likely secondary and was similar on the prior  I couldn't find mention of bowel thickening on prior images including PET 02/2021, CT a/p 10/02/20.   I wasn't sure if of concern and just wanted you to be aware of ahead of colonoscopy.

## 2022-04-02 NOTE — Telephone Encounter (Signed)
Gastroenterology Pre-Procedure Review  Request Date: 06/07/22 Requesting Physician: Dr. Marius Ditch  PATIENT REVIEW QUESTIONS: The patient responded to the following health history questions as indicated:    1. Are you having any GI issues? no 2. Do you have a personal history of Polyps? yes (2018 pt stated it was with Dr. Marius Ditch. referral noted thickening of the bowel wall incidentally found on lung cancer screen 03/29/22) 3. Do you have a family history of Colon Cancer or Polyps? no 4. Diabetes Mellitus? no 5. Joint replacements in the past 12 months?no 6. Major health problems in the past 3 months?no 7. Any artificial heart valves, MVP, or defibrillator?no    MEDICATIONS & ALLERGIES:    Patient reports the following regarding taking any anticoagulation/antiplatelet therapy:   Plavix, Coumadin, Eliquis, Xarelto, Lovenox, Pradaxa, Brilinta, or Effient? no Aspirin? yes (81 mg Aspirin)  Patient confirms/reports the following medications:  Current Outpatient Medications  Medication Sig Dispense Refill   albuterol (VENTOLIN HFA) 108 (90 Base) MCG/ACT inhaler Inhale 2 puffs into the lungs every 6 (six) hours as needed for wheezing or shortness of breath (or cough). 8 g 2   amLODipine (NORVASC) 5 MG tablet TAKE 1 TABLET BY MOUTH EVERY DAY 90 tablet 1   atorvastatin (LIPITOR) 20 MG tablet TAKE 1 TABLET BY MOUTH EVERY DAY 90 tablet 1   clobetasol ointment (TEMOVATE) 2.68 % Apply 1 application topically 2 (two) times daily. Bid aa itchy rash on arms, legs and buttocks until clear then prn flares, avoid face, groin, axilla (Patient not taking: Reported on 03/07/2022) 30 g 1   cyclobenzaprine (FLEXERIL) 5 MG tablet TAKE 1 TABLET BY MOUTH AT BEDTIME AS NEEDED FOR MUSCLE SPASMS. 30 tablet 1   fluticasone (FLONASE) 50 MCG/ACT nasal spray Place 2 sprays into both nostrils daily. (Patient taking differently: Place 2 sprays into both nostrils daily as needed for allergies.) 16 g 6   gabapentin (NEURONTIN) 100  MG capsule TAKE 1 CAPSULE (100 MG TOTAL) BY MOUTH 3 (THREE) TIMES DAILY AS NEEDED (FOR HEADACHE/NERVE PAIN). (Patient not taking: Reported on 03/07/2022) 90 capsule 0   glucose blood (ONETOUCH VERIO) test strip Used to check blood sugar once daily. 50 each 3   ipratropium-albuterol (DUONEB) 0.5-2.5 (3) MG/3ML SOLN Take 3 mLs by nebulization every 4 (four) hours as needed. 360 mL 1   losartan (COZAAR) 100 MG tablet TAKE 1 TABLET BY MOUTH EVERY DAY 90 tablet 1   metoprolol tartrate (LOPRESSOR) 25 MG tablet TAKE 1 TABLET BY MOUTH TWICE A DAY 180 tablet 1   mirtazapine (REMERON) 30 MG tablet TAKE 1 TABLET BY MOUTH AT BEDTIME. 30 tablet 2   mupirocin ointment (BACTROBAN) 2 % Apply 1 application topically 2 (two) times daily. (Patient not taking: Reported on 03/07/2022) 22 g 0   omeprazole (PRILOSEC) 20 MG capsule TAKE 1 CAPSULE BY MOUTH EVERY DAY 90 capsule 1   OneTouch Delica Lancets 34H MISC 1 Stick by Percutaneous route 2 (two) times daily. as directed 100 each 3   triamcinolone (KENALOG) 0.025 % cream Apply 1 application topically 2 (two) times daily. (Patient not taking: Reported on 03/07/2022) 15 g 2   No current facility-administered medications for this visit.    Patient confirms/reports the following allergies:  Allergies  Allergen Reactions   Celebrex [Celecoxib] Rash    Rash. Upset stomach   Lodine [Etodolac] Rash    Rash. Upset stomach   Penicillins Rash    Has patient had a PCN reaction causing immediate rash, facial/tongue/throat swelling, SOB  or lightheadedness with hypotension: Yes Has patient had a PCN reaction causing severe rash involving mucus membranes or skin necrosis: No Has patient had a PCN reaction that required hospitalization: No Has patient had a PCN reaction occurring within the last 10 years: No If all of the above answers are "NO", then may proceed with Cephalosporin use.    No orders of the defined types were placed in this encounter.   AUTHORIZATION  INFORMATION Primary Insurance: 1D#: Group #:  Secondary Insurance: 1D#: Group #:  SCHEDULE INFORMATION: Date: 06/07/22 Time: Location: [facility]

## 2022-04-04 ENCOUNTER — Ambulatory Visit (INDEPENDENT_AMBULATORY_CARE_PROVIDER_SITE_OTHER): Payer: 59 | Admitting: Pulmonary Disease

## 2022-04-04 ENCOUNTER — Encounter: Payer: Self-pay | Admitting: Pulmonary Disease

## 2022-04-04 VITALS — BP 126/76 | HR 97 | Temp 98.0°F | Ht 65.0 in | Wt 137.8 lb

## 2022-04-04 DIAGNOSIS — F17210 Nicotine dependence, cigarettes, uncomplicated: Secondary | ICD-10-CM | POA: Diagnosis not present

## 2022-04-04 DIAGNOSIS — J449 Chronic obstructive pulmonary disease, unspecified: Secondary | ICD-10-CM | POA: Diagnosis not present

## 2022-04-04 DIAGNOSIS — R911 Solitary pulmonary nodule: Secondary | ICD-10-CM

## 2022-04-04 MED ORDER — ANORO ELLIPTA 62.5-25 MCG/ACT IN AEPB: 1.0000 | INHALATION_SPRAY | Freq: Every day | RESPIRATORY_TRACT | 0 refills | Status: DC

## 2022-04-04 NOTE — Progress Notes (Signed)
Subjective:    Patient ID: Brooke Murphy, female    DOB: Dec 06, 1960, 61 y.o.   MRN: [id number] Patient Care Team: Burnard Hawthorne, FNP as PCP - General (Family Medicine) Clent Jacks, RN as Registered Nurse  Chief Complaint  Patient presents with   Follow-up    CT 03/29/2022--unable to make PFT due to transportation. C/o dry cough at times prod with light green to clear sputum and occ wheezing.     HPI The patient is a 61 year old current smoker (relapsed, 0.25 PPD, 75 PY) who presents for follow-up of a pulmonary nodule on lung cancer screening CT.  She was initially evaluated here on 04 April 2021 due to a suspicious nodule.  She was supposed to have a follow-up CT in 6 months and follow-up here after that.  She got lost to follow-up until today.previously she had had a PET/CT performed on 21 March 2021 that did not show FDG avidity on the nodule in question.  She had a low-dose CT chest for lung cancer screening purposes performed on 29 March 2022 and this shows that the nodule of interest on the prior exam had decreased in volume and regressed to 6 mm in diameter.  Other upper lobe nodules were unchanged.  Chills have resolved.  This is consistent with an inflammatory process.  CT continues to show significant emphysema.  The patient was supposed to have PFTs performed however states she never had these done because she did not have transportation.   The patient had previously quit smoking in 2020 but unfortunately has relapsed.  She has noted dry cough which has plagued her over the last few years.  This is one of the reasons why she quit smoking deviously.  She uses albuterol but this does not do anything for her cough.  Have mild  shortness of breath, does not endorse any chest pain, palpitations or any other symptomatology.  She has occasional lower extremity edema which is relieved by elevating her feet.     Review of Systems A 10 point review of systems was performed and it is as  noted above otherwise negative.  Patient Active Problem List   Diagnosis Date Noted   Skin rash 11/07/2020   Cyst of right ovary 10/28/2018   Suprapubic pressure 10/09/2018   Prediabetes 10/05/2018   Adnexal mass 10/05/2018   Constipation 10/05/2018   Hypokalemia 09/23/2018   COPD with acute exacerbation ([place]) 09/23/2018   Aortic atherosclerosis ([place]) 09/09/2018   Smoker 07/20/2018   Goiter 07/20/2018   Right arm pain 07/20/2018   Encounter for screening for malignant neoplasm of respiratory organs 07/20/2018   Headache 11/17/2017   Sinus pressure 11/17/2017   Hyperthyroidism 04/29/2017   Routine physical examination 02/12/2017   GERD (gastroesophageal reflux disease) 08/27/2016   HLD (hyperlipidemia) 08/27/2016   Depression 08/27/2016   Right shoulder pain 06/15/2015   Arthritis 04/08/2015   HTN (hypertension) 12/10/2014   Allergic rhinitis 12/10/2014   Thoracic back pain 12/10/2014   Social History   Tobacco Use   Smoking status: Some Days    Packs/day: 1.00    Years: 37.00    Total pack years: 37.00    Types: Cigarettes   Smokeless tobacco: Never   Tobacco comments:    1 pack will last 1.5wk---04/04/2022  Substance Use Topics   Alcohol use: No    Alcohol/week: 0.0 standard drinks of alcohol   Allergies  Allergen Reactions   Celebrex [Celecoxib] Rash    Rash. Upset stomach  Lodine [Etodolac] Rash    Rash. Upset stomach   Penicillins Rash    Has patient had a PCN reaction causing immediate rash, facial/tongue/throat swelling, SOB or lightheadedness with hypotension: Yes Has patient had a PCN reaction causing severe rash involving mucus membranes or skin necrosis: No Has patient had a PCN reaction that required hospitalization: No Has patient had a PCN reaction occurring within the last 10 years: No If all of the above answers are "NO", then may proceed with Cephalosporin use.   Current Meds  Medication Sig   fluticasone (FLONASE) 50 MCG/ACT nasal spray  Place 2 sprays into both nostrils daily. (Patient taking differently: Place 2 sprays into both nostrils daily as needed for allergies.)   gabapentin (NEURONTIN) 100 MG capsule TAKE 1 CAPSULE (100 MG TOTAL) BY MOUTH 3 (THREE) TIMES DAILY AS NEEDED (FOR HEADACHE/NERVE PAIN).   glucose blood (ONETOUCH VERIO) test strip Used to check blood sugar once daily.   ipratropium-albuterol (DUONEB) 0.5-2.5 (3) MG/3ML SOLN Take 3 mLs by nebulization every 4 (four) hours as needed.   metoprolol tartrate (LOPRESSOR) 25 MG tablet TAKE 1 TABLET BY MOUTH TWICE A DAY   OneTouch Delica Lancets 59F MISC 1 Stick by Percutaneous route 2 (two) times daily. as directed   triamcinolone (KENALOG) 0.025 % cream Apply 1 application topically 2 (two) times daily.   umeclidinium-vilanterol (ANORO ELLIPTA) 62.5-25 MCG/ACT AEPB Inhale 1 puff into the lungs daily.   [DISCONTINUED] albuterol (VENTOLIN HFA) 108 (90 Base) MCG/ACT inhaler Inhale 2 puffs into the lungs every 6 (six) hours as needed for wheezing or shortness of breath (or cough).   [DISCONTINUED] amLODipine (NORVASC) 5 MG tablet TAKE 1 TABLET BY MOUTH EVERY DAY   [DISCONTINUED] atorvastatin (LIPITOR) 20 MG tablet TAKE 1 TABLET BY MOUTH EVERY DAY   [DISCONTINUED] clobetasol ointment (TEMOVATE) 6.38 % Apply 1 application topically 2 (two) times daily. Bid aa itchy rash on arms, legs and buttocks until clear then prn flares, avoid face, groin, axilla (Patient not taking: Reported on 05/31/2022)   [DISCONTINUED] cyclobenzaprine (FLEXERIL) 5 MG tablet TAKE 1 TABLET BY MOUTH AT BEDTIME AS NEEDED FOR MUSCLE SPASMS.   [DISCONTINUED] losartan (COZAAR) 100 MG tablet TAKE 1 TABLET BY MOUTH EVERY DAY   [DISCONTINUED] mirtazapine (REMERON) 30 MG tablet TAKE 1 TABLET BY MOUTH AT BEDTIME.   [DISCONTINUED] mupirocin ointment (BACTROBAN) 2 % Apply 1 application topically 2 (two) times daily. (Patient not taking: Reported on 05/31/2022)   [DISCONTINUED] omeprazole (PRILOSEC) 20 MG capsule TAKE 1  CAPSULE BY MOUTH EVERY DAY   Immunization History  Administered Date(s) Administered   PFIZER(Purple Top)SARS-COV-2 Vaccination 05/24/2020, 06/15/2020      Objective:   Physical Exam BP 126/76 (BP Location: Left Arm, Cuff Size: Normal)   Pulse 97   Temp 98 F (36.7 C) (Temporal)   Ht '5\' 5"'$  (1.651 m)   Wt 137 lb 12.8 oz (62.5 kg)   SpO2 100%   BMI 22.93 kg/m  GENERAL: Well-developed well-nourished woman, no acute distress.  Fully ambulatory.  No conversational dyspnea.  Occasional clearing of the throat.  Occasional dry cough noted. HEAD: Normocephalic, atraumatic.  EYES: Pupils equal, round, reactive to light.  No scleral icterus.  MOUTH: Nose/mouth/throat not examined due to masking requirements for COVID 19. NECK: Supple. No thyromegaly. Trachea midline. No JVD.  No adenopathy. PULMONARY: Good air entry bilaterally.  No adventitious sounds. CARDIOVASCULAR: S1 and S2. Regular rate and rhythm.  No rubs, murmurs or gallops heard. ABDOMEN: Benign. MUSCULOSKELETAL: No joint deformity, no  clubbing, no edema.  NEUROLOGIC: No focal deficit, no gait disturbance, speech is fluent. SKIN: Intact,warm,dry. PSYCH: Mood and behavior normal.       Assessment & Plan:     ICD-10-CM   1. COPD suggested by initial evaluation [facility])  J44.9 Pulmonary Function Test [facility] Only   Will reorder PFTs Trial of Anoro Ellipta 1 inhalation daily Continue as needed albuterol    2. Lung nodule  R91.1    Likely benign etiology Nodule has regressed Continue lung cancer screening program    3. Tobacco dependence due to cigarettes  F17.210    Patient was counseled with regards to discontinuation of smoking     Orders Placed This Encounter  Procedures   Pulmonary Function Test [facility] Only    Standing Status:   Future    Standing Expiration Date:   04/05/2023    Scheduling Instructions:     Within 41mo   Order Specific Question:   Full PFT: includes the following: basic spirometry, spirometry pre &  post bronchodilator, diffusion capacity (DLCO), lung volumes    Answer:   Full PFT   Meds ordered this encounter  Medications   umeclidinium-vilanterol (ANORO ELLIPTA) 62.5-25 MCG/ACT AEPB    Sig: Inhale 1 puff into the lungs daily.    Dispense:  60 each    Refill:  0    Order Specific Question:   Lot Number?    Answer:   [id number]    Order Specific Question:   Expiration Date?    Answer:   06/24/2023    Order Specific Question:   Manufacturer?    Answer:   [employer] [[id number]]    Order Specific Question:   Quantity    Answer:   2   We will see the patient in follow-up in 6 months time she is to contact [employer] to that time should any new difficulties arise.  CRenold Don MD Advanced Bronchoscopy PCCM [facility]    *This note was dictated using voice recognition software/Dragon.  Despite best efforts to proofread, errors can occur which can change the meaning. Any transcriptional errors that result from this process are unintentional and may not be fully corrected at the time of dictation.

## 2022-04-04 NOTE — Patient Instructions (Addendum)
We are giving you a trial of an inhaler called Anoro this is 1 puff daily.  Let us know how this inhaler does for you.  You may still use your albuterol as needed for shortness of breath but hopefully you will needed as often on the Anoro.  Please continue to make efforts to quit smoking.  Your CT scan of the chest looked much better at this time around.  You will be scheduled for another CT scan in a year.  We will see you in follow-up in 6 months time.

## 2022-04-19 ENCOUNTER — Other Ambulatory Visit: Payer: 59

## 2022-04-22 ENCOUNTER — Other Ambulatory Visit: Payer: Self-pay | Admitting: Family

## 2022-04-26 ENCOUNTER — Other Ambulatory Visit: Payer: Self-pay

## 2022-04-26 ENCOUNTER — Telehealth: Payer: 59 | Admitting: Family

## 2022-04-26 DIAGNOSIS — Z1211 Encounter for screening for malignant neoplasm of colon: Secondary | ICD-10-CM

## 2022-04-26 NOTE — Telephone Encounter (Signed)
Hi Brooke Murphy,   I see the referral notes it looks like patient was scheduled for September and now I do not see an appointment scheduled and referral is closed.  Can we re-instate referral?   Please note reason below:    Referral as dye colonoscopy, bowel wall thickening seen incidentally on lung cancer screen 03/29/22  Let me know when she is scheduled and thank you

## 2022-04-29 ENCOUNTER — Telehealth: Payer: Self-pay

## 2022-04-29 MED ORDER — GOLYTELY 236 G PO SOLR: ORAL | 0 refills | Status: DC

## 2022-04-29 NOTE — Telephone Encounter (Signed)
Patient has been contacted to reschedule her colonoscopy from 09/15 to 09/22.  She said this would be fine.  Her bowel prep has been changed from Suprep to Golytely due to Suprep not being covered.

## 2022-04-29 NOTE — Telephone Encounter (Signed)
Dr Marius Ditch,  I wanted you to be aware that I have referred this patient to you for colonoscopy as due and no prior record available.    I also noted on referral an incidental finding seen on recent CT chest for lung cancer screening 03/29/22 which showed    Apparent wall thickening is likely secondary and was similar on the prior   I couldn't find mention of bowel thickening on prior images including PET 02/2021, CT a/p 10/02/20.    I wasn't sure if of concern and just wanted you to be aware of ahead of colonoscopy as scheduled on 06/14/22.

## 2022-05-01 NOTE — Telephone Encounter (Signed)
noted 

## 2022-05-31 ENCOUNTER — Encounter: Payer: Self-pay | Admitting: Family

## 2022-05-31 ENCOUNTER — Ambulatory Visit: Payer: BLUE CROSS/BLUE SHIELD | Attending: Cardiology | Admitting: Cardiology

## 2022-05-31 ENCOUNTER — Telehealth: Payer: Self-pay | Admitting: Family

## 2022-05-31 ENCOUNTER — Encounter: Payer: Self-pay | Admitting: Cardiology

## 2022-05-31 VITALS — BP 138/90 | HR 67 | Ht 65.0 in | Wt 137.2 lb

## 2022-05-31 DIAGNOSIS — I2510 Atherosclerotic heart disease of native coronary artery without angina pectoris: Secondary | ICD-10-CM

## 2022-05-31 DIAGNOSIS — I10 Essential (primary) hypertension: Secondary | ICD-10-CM

## 2022-05-31 DIAGNOSIS — E7800 Pure hypercholesterolemia, unspecified: Secondary | ICD-10-CM | POA: Diagnosis not present

## 2022-05-31 DIAGNOSIS — M546 Pain in thoracic spine: Secondary | ICD-10-CM

## 2022-05-31 MED ORDER — ASPIRIN 81 MG PO TBEC: 81.0000 mg | DELAYED_RELEASE_TABLET | Freq: Every day | ORAL | 3 refills | Status: AC

## 2022-05-31 MED ORDER — CYCLOBENZAPRINE HCL 5 MG PO TABS: ORAL_TABLET | ORAL | 1 refills | Status: DC

## 2022-05-31 NOTE — Patient Instructions (Signed)
Medication Instructions:   Your physician recommends that you continue on your current medications as directed. Please refer to the Current Medication list given to you today.  *If you need a refill on your cardiac medications before your next appointment, please call your pharmacy   Testing/Procedures:  Your physician has requested that you have an echocardiogram. Echocardiography is a painless test that uses sound waves to create images of your heart. It provides your doctor with information about the size and shape of your heart and how well your heart's chambers and valves are working. This procedure takes approximately one hour. There are no restrictions for this procedure.    Follow-Up: At [facility], you and your health needs are our priority.  As part of our continuing mission to provide you with exceptional heart care, we have created designated Provider Care Teams.  These Care Teams include your primary Cardiologist (physician) and Advanced Practice Providers (APPs -  Physician Assistants and Nurse Practitioners) who all work together to provide you with the care you need, when you need it.  We recommend signing up for the patient portal called "MyChart".  Sign up information is provided on this After Visit Summary.  MyChart is used to connect with patients for Virtual Visits (Telemedicine).  Patients are able to view lab/test results, encounter notes, upcoming appointments, etc.  Non-urgent messages can be sent to your provider as well.   To learn more about what you can do with MyChart, go to [email or web address].    Your next appointment:   Follow up after testing   The format for your next appointment:   In Person  Provider:   Kate Sable, MD     Important Information About Sugar

## 2022-05-31 NOTE — Progress Notes (Signed)
Cardiology Office Note:    Date:  05/31/2022   ID:  Brooke Murphy, DOB June 29, 1961, MRN [id number]  PCP:  Burnard Hawthorne, [place] Providers Cardiologist:  None     Referring MD: Burnard Hawthorne, FNP   Chief Complaint  Patient presents with   Other    Aortic Atherosclerosis no complaints today. Meds reviewed verbally with pt.   Brooke Murphy is a 61 y.o. female who is being seen today for the evaluation of coronary calcification at the request of Vidal Schwalbe Yvetta Coder, FNP.   History of Present Illness:    Brooke Murphy is a 61 y.o. female with a hx of hypertension, hyperlipidemia, former smoker x12+ years who presents due to coronary artery calcification.  Patient underwent a chest CT for lung cancer screening 03/29/2022.  LAD calcification was noted.  She denies chest pain or shortness of breath.  States quitting smoking 2 months ago.  Takes all medications as prescribed, also takes a baby aspirin daily.  Blood pressure usually controlled at home.  Feels well otherwise, has no concerns at this time.  Past Medical History:  Diagnosis Date   AAA (abdominal aortic aneurysm) ([place])    Brain aneurysm 1999   [facility]   GERD (gastroesophageal reflux disease)    Hyperlipidemia    Hypertension    Hyperthyroidism    Rheumatoid arthritis ([place]) 2002   s/p methotrexate, humira and embrel   Thyroid nodule    Ulcer of abdomen wall ([facility])    Vitamin D deficiency     Past Surgical History:  Procedure Laterality Date   ABDOMINAL HYSTERECTOMY  2005   ovaries remain; WITH CERVIX   BRAIN SURGERY Left 11/27/1997   aneurysm   COLONOSCOPY     LAPAROSCOPIC BILATERAL SALPINGO OOPHERECTOMY  11/11/2018   Procedure: LAPAROSCOPIC BILATERAL SALPINGO OOPHORECTOMY;  Surgeon: Benjaman Kindler, MD;  Location: [facility] ORS;  Service: Gynecology;;    Current Medications: Current Meds  Medication Sig   albuterol (VENTOLIN HFA) 108 (90 Base) MCG/ACT inhaler Inhale 2 puffs into the lungs every 6  (six) hours as needed for wheezing or shortness of breath (or cough).   amLODipine (NORVASC) 5 MG tablet TAKE 1 TABLET BY MOUTH EVERY DAY   aspirin EC 81 MG tablet Take 1 tablet (81 mg total) by mouth daily. Swallow whole.   atorvastatin (LIPITOR) 20 MG tablet TAKE 1 TABLET BY MOUTH EVERY DAY   cyclobenzaprine (FLEXERIL) 5 MG tablet TAKE 1 TABLET BY MOUTH AT BEDTIME AS NEEDED FOR MUSCLE SPASMS.   fluticasone (FLONASE) 50 MCG/ACT nasal spray Place 2 sprays into both nostrils daily. (Patient taking differently: Place 2 sprays into both nostrils daily as needed for allergies.)   gabapentin (NEURONTIN) 100 MG capsule TAKE 1 CAPSULE (100 MG TOTAL) BY MOUTH 3 (THREE) TIMES DAILY AS NEEDED (FOR HEADACHE/NERVE PAIN).   glucose blood (ONETOUCH VERIO) test strip Used to check blood sugar once daily.   ipratropium-albuterol (DUONEB) 0.5-2.5 (3) MG/3ML SOLN Take 3 mLs by nebulization every 4 (four) hours as needed.   losartan (COZAAR) 100 MG tablet TAKE 1 TABLET BY MOUTH EVERY DAY   metoprolol tartrate (LOPRESSOR) 25 MG tablet TAKE 1 TABLET BY MOUTH TWICE A DAY   mirtazapine (REMERON) 30 MG tablet Take 30 mg by mouth as needed.   omeprazole (PRILOSEC) 20 MG capsule TAKE 1 CAPSULE BY MOUTH EVERY DAY   OneTouch Delica Lancets 28J MISC 1 Stick by Percutaneous route 2 (two) times daily. as directed   triamcinolone (  KENALOG) 0.025 % cream Apply 1 application topically 2 (two) times daily.   umeclidinium-vilanterol (ANORO ELLIPTA) 62.5-25 MCG/ACT AEPB Inhale 1 puff into the lungs daily.   [DISCONTINUED] mirtazapine (REMERON) 30 MG tablet TAKE 1 TABLET BY MOUTH AT BEDTIME.     Allergies:   Celebrex [celecoxib], Lodine [etodolac], and Penicillins   Social History   Socioeconomic History   Marital status: Single    Spouse name: Not on file   Number of children: 5   Years of education: Not on file   Highest education level: Not on file  Occupational History   Not on file  Tobacco Use   Smoking status:  Every Day    Packs/day: 1.00    Years: 37.00    Total pack years: 37.00    Types: Cigarettes   Smokeless tobacco: Never   Tobacco comments:    1 pack will last 1.5wk---04/04/2022  Vaping Use   Vaping Use: Never used  Substance and Sexual Activity   Alcohol use: No    Alcohol/week: 0.0 standard drinks of alcohol   Drug use: No   Sexual activity: Not Currently  Other Topics Concern   Not on file  Social History Narrative   [employer]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[employer]; ticket coordinator   Social Determinants of Health   Financial Resource Strain: Not on file  Food Insecurity: Not on file  Transportation Needs: Not on file  Physical Activity: Not on file  Stress: Not on file  Social Connections: Not on file     Family History: The patient's family history includes Aneurysm (age of onset: 58) in her mother; Breast cancer (age of onset: 65) in her maternal aunt; Cancer (age of onset: 32) in her father; Diabetes in her maternal aunt; Hyperlipidemia in her sister; Hypertension in her maternal grandmother, mother, and sister; Stroke in her maternal grandmother. There is no history of Colon cancer.  ROS:   Please see the history of present illness.     All other systems reviewed and are negative.  EKGs/Labs/Other Studies Reviewed:    The following studies were reviewed today:   EKG:  EKG is  ordered today.  The ekg ordered today demonstrates normal sinus rhythm  Recent Labs: No results found for requested labs within last 365 days.  Recent Lipid Panel    Component Value Date/Time   CHOL 162 03/02/2021 0809   TRIG 140.0 03/02/2021 0809   HDL 60.70 03/02/2021 0809   CHOLHDL 3 03/02/2021 0809   VLDL 28.0 03/02/2021 0809   LDLCALC 73 03/02/2021 0809     Risk Assessment/Calculations:     HYPERTENSION CONTROL Vitals:   05/31/22 0846 05/31/22 0847  BP: (!) 138/90 (!) 138/90    The patient's blood pressure is elevated above target today.  In order to address the patient's elevated  BP: Blood pressure will be monitored at home to determine if medication changes need to be made.            Physical Exam:    VS:  BP (!) 138/90 (BP Location: Right Arm, Patient Position: Sitting, Cuff Size: Normal)   Pulse 67   Ht '5\' 5"'$  (1.651 m)   Wt 137 lb 4 oz (62.3 kg)   SpO2 96%   BMI 22.84 kg/m     Wt Readings from Last 3 Encounters:  05/31/22 137 lb 4 oz (62.3 kg)  04/04/22 137 lb 12.8 oz (62.5 kg)  03/29/22 137 lb (62.1 kg)     GEN:  Well nourished, well  developed in no acute distress HEENT: Normal NECK: No JVD; No carotid bruits LYMPHATICS: No lymphadenopathy CARDIAC: RRR, no murmurs, rubs, gallops RESPIRATORY:  Clear to auscultation without rales, wheezing or rhonchi  ABDOMEN: Soft, non-tender, non-distended MUSCULOSKELETAL:  No edema; No deformity  SKIN: Warm and dry NEUROLOGIC:  Alert and oriented x 3 PSYCHIATRIC:  Normal affect   ASSESSMENT:    1. Coronary artery disease involving native coronary artery of native heart, unspecified whether angina present   2. Primary hypertension   3. Pure hypercholesterolemia    PLAN:    In order of problems listed above:  CAD, LAD calcification on chest CT, start aspirin 81 mg, continue Lipitor 20 mg daily.  Obtain echocardiogram. Hypertension, BP elevated today, usually controlled, monitor for now.  Continue Norvasc, losartan, Lopressor. Hyperlipidemia, obtain fasting lipid profile, previous cholesterol was controlled.  Continue Lipitor 20 mg daily for now, titrate as needed. Current smoker, smoking cessation advised.  Follow-up after echo.       Medication Adjustments/Labs and Tests Ordered: Current medicines are reviewed at length with the patient today.  Concerns regarding medicines are outlined above.  Orders Placed This Encounter  Procedures   EKG 12-Lead   ECHOCARDIOGRAM COMPLETE   Meds ordered this encounter  Medications   aspirin EC 81 MG tablet    Sig: Take 1 tablet (81 mg total) by mouth  daily. Swallow whole.    Dispense:  90 tablet    Refill:  3    Patient Instructions  Medication Instructions:   Your physician recommends that you continue on your current medications as directed. Please refer to the Current Medication list given to you today.  *If you need a refill on your cardiac medications before your next appointment, please call your pharmacy   Testing/Procedures:  Your physician has requested that you have an echocardiogram. Echocardiography is a painless test that uses sound waves to create images of your heart. It provides your doctor with information about the size and shape of your heart and how well your heart's chambers and valves are working. This procedure takes approximately one hour. There are no restrictions for this procedure.    Follow-Up: At [facility], you and your health needs are our priority.  As part of our continuing mission to provide you with exceptional heart care, we have created designated Provider Care Teams.  These Care Teams include your primary Cardiologist (physician) and Advanced Practice Providers (APPs -  Physician Assistants and Nurse Practitioners) who all work together to provide you with the care you need, when you need it.  We recommend signing up for the patient portal called "MyChart".  Sign up information is provided on this After Visit Summary.  MyChart is used to connect with patients for Virtual Visits (Telemedicine).  Patients are able to view lab/test results, encounter notes, upcoming appointments, etc.  Non-urgent messages can be sent to your provider as well.   To learn more about what you can do with MyChart, go to [email or web address].    Your next appointment:   Follow up after testing   The format for your next appointment:   In Person  Provider:   Kate Sable, MD     Important Information About Sugar         Signed, Kate Sable, MD  05/31/2022 9:20 AM    [place]

## 2022-05-31 NOTE — Telephone Encounter (Signed)
Call pt  Due for f/u with me, please sch cpe  I reviewed cardiology note

## 2022-05-31 NOTE — Telephone Encounter (Signed)
Called and spoke to patient and scheduled a CPE for 06/28/22  and Patient stated that she needs a refill of the Flexeril for her back spasms?

## 2022-06-06 ENCOUNTER — Telehealth: Payer: Self-pay

## 2022-06-06 NOTE — Telephone Encounter (Signed)
Patient has requested to reschedule her 06/14/22 colonoscopy with Dr. Marius Ditch to sometime in October or November.  She said her insurance will be changing.  I've asked her to call the office back to reschedule when she receives her new insurance card and we will reschedule at then.  Trish in Endo notified.  Thanks,  [place], [place]

## 2022-06-09 ENCOUNTER — Other Ambulatory Visit: Payer: Self-pay | Admitting: Family

## 2022-06-09 DIAGNOSIS — E785 Hyperlipidemia, unspecified: Secondary | ICD-10-CM

## 2022-06-09 DIAGNOSIS — I10 Essential (primary) hypertension: Secondary | ICD-10-CM

## 2022-06-10 ENCOUNTER — Other Ambulatory Visit: Payer: Self-pay

## 2022-06-10 DIAGNOSIS — I10 Essential (primary) hypertension: Secondary | ICD-10-CM

## 2022-06-10 DIAGNOSIS — E785 Hyperlipidemia, unspecified: Secondary | ICD-10-CM

## 2022-06-10 MED ORDER — ATORVASTATIN CALCIUM 20 MG PO TABS: 20.0000 mg | ORAL_TABLET | Freq: Every day | ORAL | 3 refills | Status: DC

## 2022-06-10 NOTE — Telephone Encounter (Signed)
Rx sent patient is notified

## 2022-06-14 ENCOUNTER — Encounter: Admission: RE | Payer: Self-pay | Source: Home / Self Care

## 2022-06-14 ENCOUNTER — Ambulatory Visit: Admission: RE | Admit: 2022-06-14 | Payer: 59 | Source: Home / Self Care | Admitting: Gastroenterology

## 2022-06-14 SURGERY — COLONOSCOPY WITH PROPOFOL
Anesthesia: General

## 2022-06-28 ENCOUNTER — Ambulatory Visit: Payer: BLUE CROSS/BLUE SHIELD | Admitting: Family

## 2022-07-05 ENCOUNTER — Other Ambulatory Visit: Payer: Self-pay | Admitting: Family

## 2022-07-05 ENCOUNTER — Other Ambulatory Visit: Payer: Self-pay | Admitting: Pulmonary Disease

## 2022-07-05 DIAGNOSIS — I10 Essential (primary) hypertension: Secondary | ICD-10-CM

## 2022-07-12 ENCOUNTER — Ambulatory Visit: Payer: Commercial Managed Care - HMO | Attending: Cardiology

## 2022-07-12 DIAGNOSIS — I2510 Atherosclerotic heart disease of native coronary artery without angina pectoris: Secondary | ICD-10-CM | POA: Diagnosis not present

## 2022-07-12 DIAGNOSIS — I5030 Unspecified diastolic (congestive) heart failure: Secondary | ICD-10-CM

## 2022-07-12 LAB — ECHOCARDIOGRAM COMPLETE
AR max vel: 2.66 cm2
AV Area VTI: 2.86 cm2
AV Area mean vel: 2.8 cm2
AV Mean grad: 3 mmHg
AV Peak grad: 6.4 mmHg
Ao pk vel: 1.26 m/s
Area-P 1/2: 3.23 cm2
Calc EF: 66.6 %
S' Lateral: 2.4 cm
Single Plane A2C EF: 67.2 %
Single Plane A4C EF: 63.6 %

## 2022-08-09 DIAGNOSIS — N3000 Acute cystitis without hematuria: Secondary | ICD-10-CM | POA: Diagnosis not present

## 2022-08-26 ENCOUNTER — Other Ambulatory Visit: Payer: Self-pay | Admitting: Family

## 2022-08-26 DIAGNOSIS — I10 Essential (primary) hypertension: Secondary | ICD-10-CM

## 2022-09-14 DIAGNOSIS — M1990 Unspecified osteoarthritis, unspecified site: Secondary | ICD-10-CM | POA: Diagnosis not present

## 2022-09-14 DIAGNOSIS — Z833 Family history of diabetes mellitus: Secondary | ICD-10-CM | POA: Diagnosis not present

## 2022-09-14 DIAGNOSIS — K219 Gastro-esophageal reflux disease without esophagitis: Secondary | ICD-10-CM | POA: Diagnosis not present

## 2022-09-14 DIAGNOSIS — Z87891 Personal history of nicotine dependence: Secondary | ICD-10-CM | POA: Diagnosis not present

## 2022-09-14 DIAGNOSIS — Z8249 Family history of ischemic heart disease and other diseases of the circulatory system: Secondary | ICD-10-CM | POA: Diagnosis not present

## 2022-09-14 DIAGNOSIS — K08109 Complete loss of teeth, unspecified cause, unspecified class: Secondary | ICD-10-CM | POA: Diagnosis not present

## 2022-09-14 DIAGNOSIS — E785 Hyperlipidemia, unspecified: Secondary | ICD-10-CM | POA: Diagnosis not present

## 2022-09-14 DIAGNOSIS — Z7982 Long term (current) use of aspirin: Secondary | ICD-10-CM | POA: Diagnosis not present

## 2022-09-14 DIAGNOSIS — I2510 Atherosclerotic heart disease of native coronary artery without angina pectoris: Secondary | ICD-10-CM | POA: Diagnosis not present

## 2022-09-14 DIAGNOSIS — I10 Essential (primary) hypertension: Secondary | ICD-10-CM | POA: Diagnosis not present

## 2022-09-14 DIAGNOSIS — I672 Cerebral atherosclerosis: Secondary | ICD-10-CM | POA: Diagnosis not present

## 2022-11-12 ENCOUNTER — Telehealth: Payer: Self-pay | Admitting: Family

## 2022-11-12 NOTE — Telephone Encounter (Signed)
Call pt She has missed appointments with me  She is due for CPE .  Please sch

## 2022-11-19 ENCOUNTER — Encounter: Payer: Self-pay | Admitting: Pharmacist

## 2022-11-25 ENCOUNTER — Encounter: Payer: Self-pay | Admitting: Family

## 2022-11-25 ENCOUNTER — Ambulatory Visit: Payer: 59 | Admitting: Family

## 2022-11-25 ENCOUNTER — Other Ambulatory Visit (HOSPITAL_COMMUNITY)
Admission: RE | Admit: 2022-11-25 | Discharge: 2022-11-25 | Disposition: A | Discharge status: Home / Self Care | Payer: 59 | Source: Ambulatory Visit | Attending: Family | Admitting: Family

## 2022-11-25 VITALS — BP 122/78 | HR 79 | Temp 98.0°F | Ht 65.0 in | Wt 135.8 lb

## 2022-11-25 DIAGNOSIS — E785 Hyperlipidemia, unspecified: Secondary | ICD-10-CM | POA: Diagnosis not present

## 2022-11-25 DIAGNOSIS — Z124 Encounter for screening for malignant neoplasm of cervix: Secondary | ICD-10-CM

## 2022-11-25 DIAGNOSIS — Z0000 Encounter for general adult medical examination without abnormal findings: Secondary | ICD-10-CM

## 2022-11-25 DIAGNOSIS — I10 Essential (primary) hypertension: Secondary | ICD-10-CM

## 2022-11-25 DIAGNOSIS — Z1231 Encounter for screening mammogram for malignant neoplasm of breast: Secondary | ICD-10-CM

## 2022-11-25 DIAGNOSIS — I700 Atherosclerosis of aorta: Secondary | ICD-10-CM

## 2022-11-25 DIAGNOSIS — Z13220 Encounter for screening for lipoid disorders: Secondary | ICD-10-CM

## 2022-11-25 DIAGNOSIS — Z1211 Encounter for screening for malignant neoplasm of colon: Secondary | ICD-10-CM | POA: Diagnosis not present

## 2022-11-25 DIAGNOSIS — Z136 Encounter for screening for cardiovascular disorders: Secondary | ICD-10-CM | POA: Diagnosis not present

## 2022-11-25 DIAGNOSIS — Z8639 Personal history of other endocrine, nutritional and metabolic disease: Secondary | ICD-10-CM

## 2022-11-25 DIAGNOSIS — K219 Gastro-esophageal reflux disease without esophagitis: Secondary | ICD-10-CM

## 2022-11-25 LAB — COMPREHENSIVE METABOLIC PANEL
ALT: 12 U/L (ref 0–35)
AST: 19 U/L (ref 0–37)
Albumin: 4.4 g/dL (ref 3.5–5.2)
Alkaline Phosphatase: 71 U/L (ref 39–117)
BUN: 19 mg/dL (ref 6–23)
CO2: 28 mEq/L (ref 19–32)
Calcium: 10.2 mg/dL (ref 8.4–10.5)
Chloride: 98 mEq/L (ref 96–112)
Creatinine, Ser: 0.75 mg/dL (ref 0.40–1.20)
GFR: 84.78 mL/min (ref >60.00)
Glucose, Bld: 75 mg/dL (ref 70–99)
Potassium: 4.4 mEq/L (ref 3.5–5.1)
Sodium: 137 mEq/L (ref 135–145)
Total Bilirubin: 0.9 mg/dL (ref 0.2–1.2)
Total Protein: 7.1 g/dL (ref 6.0–8.3)

## 2022-11-25 LAB — LIPID PANEL
Cholesterol: 155 mg/dL (ref 0–200)
HDL: 66.1 mg/dL (ref >39.00)
LDL Cholesterol: 58 mg/dL (ref 0–99)
NonHDL: 88.43
Total CHOL/HDL Ratio: 2
Triglycerides: 151 mg/dL — ABNORMAL HIGH (ref 0.0–149.0)
VLDL: 30.2 mg/dL (ref 0.0–40.0)

## 2022-11-25 LAB — CBC WITH DIFFERENTIAL/PLATELET
Basophils Absolute: 0 10*3/uL (ref 0.0–0.1)
Basophils Relative: 0.6 % (ref 0.0–3.0)
Eosinophils Absolute: 0.1 10*3/uL (ref 0.0–0.7)
Eosinophils Relative: 0.8 % (ref 0.0–5.0)
HCT: 42.8 % (ref 36.0–46.0)
Hemoglobin: 14.3 g/dL (ref 12.0–15.0)
Lymphocytes Relative: 37.5 % (ref 12.0–46.0)
Lymphs Abs: 2.6 10*3/uL (ref 0.7–4.0)
MCHC: 33.4 g/dL (ref 30.0–36.0)
MCV: 93.7 fl (ref 78.0–100.0)
Monocytes Absolute: 0.5 10*3/uL (ref 0.1–1.0)
Monocytes Relative: 6.8 % (ref 3.0–12.0)
Neutro Abs: 3.7 10*3/uL (ref 1.4–7.7)
Neutrophils Relative %: 54.3 % (ref 43.0–77.0)
Platelets: 344 10*3/uL (ref 150.0–400.0)
RBC: 4.57 Mil/uL (ref 3.87–5.11)
RDW: 14.2 % (ref 11.5–15.5)
WBC: 6.9 10*3/uL (ref 4.0–10.5)

## 2022-11-25 LAB — HEMOGLOBIN A1C: Hgb A1c MFr Bld: 6.2 % (ref 4.6–6.5)

## 2022-11-25 LAB — VITAMIN D 25 HYDROXY (VIT D DEFICIENCY, FRACTURES): VITD: 36.19 ng/mL (ref 30.00–100.00)

## 2022-11-25 LAB — TSH: TSH: 0.4 u[IU]/mL (ref 0.35–5.50)

## 2022-11-25 MED ORDER — ATORVASTATIN CALCIUM 20 MG PO TABS: 20.0000 mg | ORAL_TABLET | Freq: Every day | ORAL | 3 refills | Status: DC

## 2022-11-25 MED ORDER — OMEPRAZOLE 20 MG PO CPDR: DELAYED_RELEASE_CAPSULE | ORAL | 3 refills | Status: DC

## 2022-11-25 MED ORDER — LOSARTAN POTASSIUM 100 MG PO TABS: 100.0000 mg | ORAL_TABLET | Freq: Every day | ORAL | 3 refills | Status: DC

## 2022-11-25 MED ORDER — AMLODIPINE BESYLATE 5 MG PO TABS: 5.0000 mg | ORAL_TABLET | Freq: Every day | ORAL | 3 refills | Status: DC

## 2022-11-25 MED ORDER — METOPROLOL TARTRATE 25 MG PO TABS: 25.0000 mg | ORAL_TABLET | Freq: Two times a day (BID) | ORAL | 3 refills | Status: DC

## 2022-11-25 NOTE — Progress Notes (Signed)
Assessment & Plan:  Screen for colon cancer -     Ambulatory referral to Gastroenterology  Essential hypertension -     Metoprolol Tartrate; Take 1 tablet (25 mg total) by mouth 2 (two) times daily.  Dispense: 180 tablet; Refill: 3 -     Losartan Potassium; Take 1 tablet (100 mg total) by mouth daily.  Dispense: 90 tablet; Refill: 3 -     amLODIPine Besylate; Take 1 tablet (5 mg total) by mouth daily.  Dispense: 90 tablet; Refill: 3 -     Atorvastatin Calcium; Take 1 tablet (20 mg total) by mouth daily.  Dispense: 90 tablet; Refill: 3 -     TSH -     CBC with Differential/Platelet -     Comprehensive metabolic panel  Hyperlipidemia, unspecified hyperlipidemia type -     Atorvastatin Calcium; Take 1 tablet (20 mg total) by mouth daily.  Dispense: 90 tablet; Refill: 3 -     Lipid panel -     Hemoglobin A1c  Encounter for abdominal aortic aneurysm (AAA) screening -     US AORTA; Future  History of vitamin D deficiency -     VITAMIN D 25 Hydroxy (Vit-D Deficiency, Fractures)  Encounter for lipid screening for cardiovascular disease -     US AORTA; Future  Encounter for screening mammogram for malignant neoplasm of breast  Gastroesophageal reflux disease, unspecified whether esophagitis present -     Omeprazole; TAKE 1 CAPSULE BY MOUTH EVERY DAY  Dispense: 90 capsule; Refill: 3  Screening for cervical cancer -     Cytology - PAP  Primary hypertension Assessment & Plan: Chronic, stable.  Continue Toprol tartrate 25 mg twice daily, losartan 100 mg daily, amlodipine 5 mg daily.    Routine physical examination Assessment & Plan: Clinical breast exam performed today, Pap smear obtained.  Patient declines Tdap vaccine, pneumonia vaccine.  She is following with CT lung cancer screening program.  Advised AAA screening due to history of smoking.  Discussed previous CT chest 7/23 which showed abdominal wall thickening.  Advised dedicated follow-up gastroenterology in this regard.  I  placed a referral to [facility] and notated abdominal thickening.  I also printed report for patient to take with her to appointment.  She be due for endoscopy due to longstanding GERD, colonoscopy.   Aortic atherosclerosis [facility]) Assessment & Plan: Chronic, symptomatically stable Aortic atherosclerosis as seen CT chest 03/29/22 LAD coronary artery calcification.  She has had a consult with Dr Charlestine Night 05/31/2022 will order echocardiogram.  Advised continuing Lipitor 20 mg daily.  He started her on aspirin 81 mg.        Return precautions given.   Risks, benefits, and alternatives of the medications and treatment plan prescribed today were discussed, and patient expressed understanding.   Education regarding symptom management and diagnosis given to patient on AVS either electronically or printed.  No follow-ups on file.  [name], FNP  Subjective:    Patient ID: Brooke Murphy, female    DOB: 1961/08/11, 62 y.o.   MRN: [id number]  CC: Brooke Murphy is a 62 y.o. female who presents today for physical exam.    HPI: Feels well today  No complaints She is compliant with asa '81mg'$   GERD- compliant with omeprazole '20mg'$  qd No trouble or pain with swallowing.    She is compliant with Toprol tartrate 25 mg twice daily, losartan 100 mg daily, amlodipine 5 mg daily.  Denies chest pain shortness of breath Aortic atherosclerosis as  seen CT chest 03/29/22 LAD coronary artery calcification.  She has had a consult with Dr Charlestine Night 05/31/2022 will order echocardiogram.  Advised continuing Lipitor 20 mg daily.  He started her on aspirin 81 mg   Colorectal Cancer Screening: due Breast Cancer Screening: Mammogram overdue Cervical Cancer Screening: History of hysterectomy ovaries remain. She has intact cervix   Pap smear is due; 09/11/2018 negative HPV, malignancy Bone Health screening/DEXA for 65+: No increased fracture risk. Defer screening at this time.  Lung Cancer Screening: Participating in C1  cancer screening program.  CT chest up-to-date 03/29/2022  Due for AAA screen         Tetanus - due; she declines        Pneumococcal - Candidate for; she declines  Labs: Screening labs today. Exercise: Gets regular exercise with walking, riding a bike 3-4 days per week.  Alcohol use:  none Smoking/tobacco use: smoker.    Health Maintenance  Topic Date Due   COLONOSCOPY (Pts 45-57yr Insurance coverage will need to be confirmed)  10/10/2016   MAMMOGRAM  08/26/2020   PAP SMEAR-Modifier  09/11/2021   COVID-19 Vaccine (3 - 2023-24 season) 12/11/2022 (Originally 05/24/2022)   INFLUENZA VACCINE  12/22/2022 (Originally 04/23/2022)   Lung Cancer Screening  03/30/2023   Hepatitis C Screening  Completed   HIV Screening  Completed   HPV VACCINES  Aged Out   DTaP/Tdap/Td  Discontinued   Zoster Vaccines- Shingrix  Discontinued    ALLERGIES: Celebrex [celecoxib], Lodine [etodolac], and Penicillins  Current Outpatient Medications on File Prior to Visit  Medication Sig Dispense Refill   albuterol (VENTOLIN HFA) 108 (90 Base) MCG/ACT inhaler INHALE 2 PUFFS INTO THE LUNGS EVERY 6 (SIX) HOURS AS NEEDED FOR WHEEZING OR SHORTNESS OF BREATH (OR COUGH). 18 each 2   aspirin EC 81 MG tablet Take 1 tablet (81 mg total) by mouth daily. Swallow whole. 90 tablet 3   cyclobenzaprine (FLEXERIL) 5 MG tablet TAKE 1 TABLET BY MOUTH AT BEDTIME AS NEEDED FOR MUSCLE SPASMS. 30 tablet 1   fluticasone (FLONASE) 50 MCG/ACT nasal spray Place 2 sprays into both nostrils daily. (Patient taking differently: Place 2 sprays into both nostrils daily as needed for allergies.) 16 g 6   gabapentin (NEURONTIN) 100 MG capsule TAKE 1 CAPSULE (100 MG TOTAL) BY MOUTH 3 (THREE) TIMES DAILY AS NEEDED (FOR HEADACHE/NERVE PAIN). 90 capsule 0   glucose blood (ONETOUCH VERIO) test strip Used to check blood sugar once daily. 50 each 3   ipratropium-albuterol (DUONEB) 0.5-2.5 (3) MG/3ML SOLN Take 3 mLs by nebulization every 4 (four) hours as  needed. 360 mL 1   OneTouch Delica Lancets [id number] 1 Stick by Percutaneous route 2 (two) times daily. as directed 100 each 3   polyethylene glycol (GOLYTELY) 236 g solution 4 pm the evening before colonoscopy. Fill Golytely bowel prep to the fill line with water or other clear liquid.  Mix well. Drink 8 oz every15-20 mins until half has been completed.  Continue clear liquid diet.  5 hours prior to colonoscopy continue to drink the remainder of Golytely prep.  Drinking 8 oz every 15 to 20 mins until all contents have been completed.  Do not eat or drink anything 2 hours before procedure. 4000 mL 0   triamcinolone (KENALOG) 0.025 % cream Apply 1 application topically 2 (two) times daily. 15 g 2   umeclidinium-vilanterol (ANORO ELLIPTA) 62.5-25 MCG/ACT AEPB Inhale 1 puff into the lungs daily. 60 each 0   No current facility-administered medications  on file prior to visit.    Review of Systems  Constitutional:  Negative for chills, fever and unexpected weight change.  HENT:  Negative for congestion.   Respiratory:  Negative for cough.   Cardiovascular:  Negative for chest pain, palpitations and leg swelling.  Gastrointestinal:  Negative for nausea and vomiting.  Musculoskeletal:  Negative for arthralgias and myalgias.  Skin:  Negative for rash.  Neurological:  Negative for headaches.  Hematological:  Negative for adenopathy.  Psychiatric/Behavioral:  Negative for confusion.       Objective:    BP 122/78   Pulse 79   Temp 98 F (36.7 C)   Ht '5\' 5"'$  (1.651 m)   Wt 135 lb 12.8 oz (61.6 kg)   SpO2 95%   BMI 22.60 kg/m   BP Readings from Last 3 Encounters:  11/25/22 122/78  05/31/22 (!) 138/90  04/04/22 126/76   Wt Readings from Last 3 Encounters:  11/25/22 135 lb 12.8 oz (61.6 kg)  05/31/22 137 lb 4 oz (62.3 kg)  04/04/22 137 lb 12.8 oz (62.5 kg)    Physical Exam Vitals reviewed.  Constitutional:      Appearance: Normal appearance. She is well-developed.  Eyes:      Conjunctiva/sclera: Conjunctivae normal.  Neck:     Thyroid: No thyroid mass or thyromegaly.  Cardiovascular:     Rate and Rhythm: Normal rate and regular rhythm.     Pulses: Normal pulses.     Heart sounds: Normal heart sounds.  Pulmonary:     Effort: Pulmonary effort is normal.     Breath sounds: Normal breath sounds. No wheezing, rhonchi or rales.  Chest:  Breasts:    Breasts are symmetrical.     Right: No inverted nipple, mass, nipple discharge, skin change or tenderness.     Left: No inverted nipple, mass, nipple discharge, skin change or tenderness.  Abdominal:     General: Bowel sounds are normal. There is no distension.     Palpations: Abdomen is soft. Abdomen is not rigid. There is no fluid wave or mass.     Tenderness: There is no abdominal tenderness. There is no guarding or rebound.  Genitourinary:    Cervix: No cervical motion tenderness, discharge or friability.     Uterus: Not enlarged, not fixed and not tender.      Adnexa:        Right: No mass, tenderness or fullness.         Left: No mass, tenderness or fullness.       Comments: Pap performed. No CMT. Unable to appreciated ovaries. Lymphadenopathy:     Head:     Right side of head: No submental, submandibular, tonsillar, preauricular, posterior auricular or occipital adenopathy.     Left side of head: No submental, submandibular, tonsillar, preauricular, posterior auricular or occipital adenopathy.     Cervical:     Right cervical: No superficial, deep or posterior cervical adenopathy.    Left cervical: No superficial, deep or posterior cervical adenopathy.     Upper Body:     Right upper body: No pectoral adenopathy.     Left upper body: No pectoral adenopathy.  Skin:    General: Skin is warm and dry.  Neurological:     Mental Status: She is alert.  Psychiatric:        Speech: Speech normal.        Behavior: Behavior normal.        Thought Content: Thought content normal.

## 2022-11-25 NOTE — Assessment & Plan Note (Signed)
Chronic, stable.  Continue Toprol tartrate 25 mg twice daily, losartan 100 mg daily, amlodipine 5 mg daily.

## 2022-11-25 NOTE — Patient Instructions (Signed)
Your abdomen to screen for ultrasound abdominal aneurysm.  This is a preventative test.  I have also ordered  colonoscopy and endoscopy due to the abdominal wall thickening seen on CT chest from last summer.  Let us know if you dont hear back within a week in regards to an appointment being scheduled.   So that you are aware, if you are [facility] MyChart user , please pay attention to your MyChart messages as you may receive a MyChart message with a phone number to call and schedule this test/appointment own your own from our referral coordinator. This is a new process so I do not want you to miss this message.  If you are not a MyChart user, you will receive a phone call.   Mammogram as scheduled  Health Maintenance for Postmenopausal Women Menopause is a normal process in which your ability to get pregnant comes to an end. This process happens slowly over many months or years, usually between the ages of 4 and 35. Menopause is complete when you have missed your menstrual period for 12 months. It is important to talk with your health care provider about some of the most common conditions that affect women after menopause (postmenopausal women). These include heart disease, cancer, and bone loss (osteoporosis). Adopting a healthy lifestyle and getting preventive care can help to promote your health and wellness. The actions you take can also lower your chances of developing some of these common conditions. What are the signs and symptoms of menopause? During menopause, you may have the following symptoms: Hot flashes. These can be moderate or severe. Night sweats. Decrease in sex drive. Mood swings. Headaches. Tiredness (fatigue). Irritability. Memory problems. Problems falling asleep or staying asleep. Talk with your health care provider about treatment options for your symptoms. Do I need hormone replacement therapy? Hormone replacement therapy is effective in treating symptoms that are caused  by menopause, such as hot flashes and night sweats. Hormone replacement carries certain risks, especially as you become older. If you are thinking about using estrogen or estrogen with progestin, discuss the benefits and risks with your health care provider. How can I reduce my risk for heart disease and stroke? The risk of heart disease, heart attack, and stroke increases as you age. One of the causes may be a change in the body's hormones during menopause. This can affect how your body uses dietary fats, triglycerides, and cholesterol. Heart attack and stroke are medical emergencies. There are many things that you can do to help prevent heart disease and stroke. Watch your blood pressure High blood pressure causes heart disease and increases the risk of stroke. This is more likely to develop in people who have high blood pressure readings or are overweight. Have your blood pressure checked: Every 3-5 years if you are 54-80 years of age. Every year if you are 32 years old or older. Eat a healthy diet  Eat a diet that includes plenty of vegetables, fruits, low-fat dairy products, and lean protein. Do not eat a lot of foods that are high in solid fats, added sugars, or sodium. Get regular exercise Get regular exercise. This is one of the most important things you can do for your health. Most adults should: Try to exercise for at least 150 minutes each week. The exercise should increase your heart rate and make you sweat (moderate-intensity exercise). Try to do strengthening exercises at least twice each week. Do these in addition to the moderate-intensity exercise. Spend less time sitting.  Even light physical activity can be beneficial. Other tips Work with your health care provider to achieve or maintain a healthy weight. Do not use any products that contain nicotine or tobacco. These products include cigarettes, chewing tobacco, and vaping devices, such as e-cigarettes. If you need help  quitting, ask your health care provider. Know your numbers. Ask your health care provider to check your cholesterol and your blood sugar (glucose). Continue to have your blood tested as directed by your health care provider. Do I need screening for cancer? Depending on your health history and family history, you may need to have cancer screenings at different stages of your life. This may include screening for: Breast cancer. Cervical cancer. Lung cancer. Colorectal cancer. What is my risk for osteoporosis? After menopause, you may be at increased risk for osteoporosis. Osteoporosis is a condition in which bone destruction happens more quickly than new bone creation. To help prevent osteoporosis or the bone fractures that can happen because of osteoporosis, you may take the following actions: If you are 27-33 years old, get at least 1,000 mg of calcium and at least 600 international units (IU) of vitamin D per day. If you are older than age 63 but younger than age 81, get at least 1,200 mg of calcium and at least 600 international units (IU) of vitamin D per day. If you are older than age 11, get at least 1,200 mg of calcium and at least 800 international units (IU) of vitamin D per day. Smoking and drinking excessive alcohol increase the risk of osteoporosis. Eat foods that are rich in calcium and vitamin D, and do weight-bearing exercises several times each week as directed by your health care provider. How does menopause affect my mental health? Depression may occur at any age, but it is more common as you become older. Common symptoms of depression include: Feeling depressed. Changes in sleep patterns. Changes in appetite or eating patterns. Feeling an overall lack of motivation or enjoyment of activities that you previously enjoyed. Frequent crying spells. Talk with your health care provider if you think that you are experiencing any of these symptoms. General instructions See your health  care provider for regular wellness exams and vaccines. This may include: Scheduling regular health, dental, and eye exams. Getting and maintaining your vaccines. These include: Influenza vaccine. Get this vaccine each year before the flu season begins. Pneumonia vaccine. Shingles vaccine. Tetanus, diphtheria, and pertussis (Tdap) booster vaccine. Your health care provider may also recommend other immunizations. Tell your health care provider if you have ever been abused or do not feel safe at home. Summary Menopause is a normal process in which your ability to get pregnant comes to an end. This condition causes hot flashes, night sweats, decreased interest in sex, mood swings, headaches, or lack of sleep. Treatment for this condition may include hormone replacement therapy. Take actions to keep yourself healthy, including exercising regularly, eating a healthy diet, watching your weight, and checking your blood pressure and blood sugar levels. Get screened for cancer and depression. Make sure that you are up to date with all your vaccines. This information is not intended to replace advice given to you by your health care provider. Make sure you discuss any questions you have with your health care provider. Document Revised: 01/29/2021 Document Reviewed: 01/29/2021 Elsevier Patient Education  [place].

## 2022-11-25 NOTE — Assessment & Plan Note (Signed)
Clinical breast exam performed today, Pap smear obtained.  Patient declines Tdap vaccine, pneumonia vaccine.  She is following with CT lung cancer screening program.  Advised AAA screening due to history of smoking.  Discussed previous CT chest 7/23 which showed abdominal wall thickening.  Advised dedicated follow-up gastroenterology in this regard.  I placed a referral to [facility] and notated abdominal thickening.  I also printed report for patient to take with her to appointment.  She be due for endoscopy due to longstanding GERD, colonoscopy.

## 2022-11-25 NOTE — Assessment & Plan Note (Signed)
Chronic, symptomatically stable Aortic atherosclerosis as seen CT chest 03/29/22 LAD coronary artery calcification.  She has had a consult with Dr Charlestine Night 05/31/2022 will order echocardiogram.  Advised continuing Lipitor 20 mg daily.  He started her on aspirin 81 mg.

## 2022-11-29 ENCOUNTER — Telehealth: Payer: Self-pay | Admitting: *Deleted

## 2022-11-29 ENCOUNTER — Other Ambulatory Visit: Payer: Self-pay | Admitting: *Deleted

## 2022-11-29 DIAGNOSIS — Z1211 Encounter for screening for malignant neoplasm of colon: Secondary | ICD-10-CM

## 2022-11-29 MED ORDER — PEG 3350-KCL-NABCB-NACL-NASULF 236 G PO SOLR: 4000.0000 mL | Freq: Once | ORAL | 0 refills | Status: AC

## 2022-11-29 NOTE — Telephone Encounter (Signed)
Patient canceled her colonoscopy on 06/15/2023 due to getting new insurance.  Patient will be schedule to 12/30/2022. New order and new instructions will be sent. [facility] in [place], [place]

## 2022-12-03 LAB — CYTOLOGY - PAP
Comment: NEGATIVE
Diagnosis: NEGATIVE
High risk HPV: POSITIVE — AB

## 2022-12-06 ENCOUNTER — Encounter: Payer: Self-pay | Admitting: Family

## 2022-12-06 ENCOUNTER — Other Ambulatory Visit: Payer: Self-pay | Admitting: Family

## 2022-12-06 DIAGNOSIS — R87810 Cervical high risk human papillomavirus (HPV) DNA test positive: Secondary | ICD-10-CM

## 2022-12-11 ENCOUNTER — Telehealth: Payer: Self-pay

## 2022-12-11 NOTE — Telephone Encounter (Signed)
LVM   to call back to go over results 

## 2022-12-11 NOTE — Telephone Encounter (Signed)
Patient states she is returning a call from Jenate [place], [place].  I spoke with Jenate and transferred the call to her.

## 2022-12-12 DIAGNOSIS — Z880 Allergy status to penicillin: Secondary | ICD-10-CM | POA: Diagnosis not present

## 2022-12-12 DIAGNOSIS — Z833 Family history of diabetes mellitus: Secondary | ICD-10-CM | POA: Diagnosis not present

## 2022-12-12 DIAGNOSIS — Z8249 Family history of ischemic heart disease and other diseases of the circulatory system: Secondary | ICD-10-CM | POA: Diagnosis not present

## 2022-12-12 DIAGNOSIS — M1990 Unspecified osteoarthritis, unspecified site: Secondary | ICD-10-CM | POA: Diagnosis not present

## 2022-12-12 DIAGNOSIS — Z823 Family history of stroke: Secondary | ICD-10-CM | POA: Diagnosis not present

## 2022-12-12 DIAGNOSIS — Z803 Family history of malignant neoplasm of breast: Secondary | ICD-10-CM | POA: Diagnosis not present

## 2022-12-12 DIAGNOSIS — I2510 Atherosclerotic heart disease of native coronary artery without angina pectoris: Secondary | ICD-10-CM | POA: Diagnosis not present

## 2022-12-12 DIAGNOSIS — I10 Essential (primary) hypertension: Secondary | ICD-10-CM | POA: Diagnosis not present

## 2022-12-12 DIAGNOSIS — Z885 Allergy status to narcotic agent status: Secondary | ICD-10-CM | POA: Diagnosis not present

## 2022-12-12 DIAGNOSIS — E785 Hyperlipidemia, unspecified: Secondary | ICD-10-CM | POA: Diagnosis not present

## 2022-12-12 DIAGNOSIS — Z87891 Personal history of nicotine dependence: Secondary | ICD-10-CM | POA: Diagnosis not present

## 2022-12-12 DIAGNOSIS — K219 Gastro-esophageal reflux disease without esophagitis: Secondary | ICD-10-CM | POA: Diagnosis not present

## 2022-12-13 ENCOUNTER — Ambulatory Visit
Admission: RE | Admit: 2022-12-13 | Discharge: 2022-12-13 | Disposition: A | Discharge status: Home / Self Care | Payer: 59 | Source: Ambulatory Visit | Attending: Family | Admitting: Family

## 2022-12-13 ENCOUNTER — Ambulatory Visit: Payer: 59

## 2022-12-13 DIAGNOSIS — Z1231 Encounter for screening mammogram for malignant neoplasm of breast: Secondary | ICD-10-CM | POA: Insufficient documentation

## 2022-12-23 ENCOUNTER — Other Ambulatory Visit: Payer: Self-pay

## 2022-12-23 ENCOUNTER — Observation Stay
Admission: EM | Admit: 2022-12-23 | Discharge: 2022-12-24 | Disposition: A | Discharge status: Home / Self Care | Payer: 59 | Attending: Internal Medicine | Admitting: Internal Medicine

## 2022-12-23 ENCOUNTER — Emergency Department: Payer: 59

## 2022-12-23 DIAGNOSIS — R7989 Other specified abnormal findings of blood chemistry: Secondary | ICD-10-CM | POA: Diagnosis not present

## 2022-12-23 DIAGNOSIS — Z79899 Other long term (current) drug therapy: Secondary | ICD-10-CM | POA: Insufficient documentation

## 2022-12-23 DIAGNOSIS — F17210 Nicotine dependence, cigarettes, uncomplicated: Secondary | ICD-10-CM | POA: Insufficient documentation

## 2022-12-23 DIAGNOSIS — J449 Chronic obstructive pulmonary disease, unspecified: Secondary | ICD-10-CM | POA: Insufficient documentation

## 2022-12-23 DIAGNOSIS — Z7982 Long term (current) use of aspirin: Secondary | ICD-10-CM | POA: Diagnosis not present

## 2022-12-23 DIAGNOSIS — G459 Transient cerebral ischemic attack, unspecified: Secondary | ICD-10-CM | POA: Diagnosis not present

## 2022-12-23 DIAGNOSIS — R200 Anesthesia of skin: Principal | ICD-10-CM | POA: Diagnosis present

## 2022-12-23 DIAGNOSIS — I671 Cerebral aneurysm, nonruptured: Secondary | ICD-10-CM | POA: Diagnosis not present

## 2022-12-23 DIAGNOSIS — R202 Paresthesia of skin: Secondary | ICD-10-CM | POA: Insufficient documentation

## 2022-12-23 DIAGNOSIS — I10 Essential (primary) hypertension: Secondary | ICD-10-CM | POA: Insufficient documentation

## 2022-12-23 DIAGNOSIS — R748 Abnormal levels of other serum enzymes: Secondary | ICD-10-CM | POA: Diagnosis present

## 2022-12-23 LAB — CBC WITH DIFFERENTIAL/PLATELET
Abs Immature Granulocytes: 0.01 10*3/uL (ref 0.00–0.07)
Basophils Absolute: 0 10*3/uL (ref 0.0–0.1)
Basophils Relative: 1 %
Eosinophils Absolute: 0 10*3/uL (ref 0.0–0.5)
Eosinophils Relative: 1 %
HCT: 45.6 % (ref 36.0–46.0)
Hemoglobin: 14.7 g/dL (ref 12.0–15.0)
Immature Granulocytes: 0 %
Lymphocytes Relative: 38 %
Lymphs Abs: 2 10*3/uL (ref 0.7–4.0)
MCH: 30.3 pg (ref 26.0–34.0)
MCHC: 32.2 g/dL (ref 30.0–36.0)
MCV: 94 fL (ref 80.0–100.0)
Monocytes Absolute: 0.3 10*3/uL (ref 0.1–1.0)
Monocytes Relative: 6 %
Neutro Abs: 2.9 10*3/uL (ref 1.7–7.7)
Neutrophils Relative %: 54 %
Platelets: 317 10*3/uL (ref 150–400)
RBC: 4.85 MIL/uL (ref 3.87–5.11)
RDW: 13.5 % (ref 11.5–15.5)
WBC: 5.3 10*3/uL (ref 4.0–10.5)
nRBC: 0 % (ref 0.0–0.2)

## 2022-12-23 LAB — BASIC METABOLIC PANEL
Anion gap: 10 (ref 5–15)
BUN: 11 mg/dL (ref 8–23)
CO2: 25 mmol/L (ref 22–32)
Calcium: 9.8 mg/dL (ref 8.9–10.3)
Chloride: 101 mmol/L (ref 98–111)
Creatinine, Ser: 0.62 mg/dL (ref 0.44–1.00)
GFR, Estimated: >60 mL/min (ref >60)
Glucose, Bld: 125 mg/dL — ABNORMAL HIGH (ref 70–99)
Potassium: 4.5 mmol/L (ref 3.5–5.1)
Sodium: 136 mmol/L (ref 135–145)

## 2022-12-23 LAB — HIV ANTIBODY (ROUTINE TESTING W REFLEX): HIV Screen 4th Generation wRfx: NONREACTIVE

## 2022-12-23 LAB — CBG MONITORING, ED: Glucose-Capillary: 98 mg/dL (ref 70–99)

## 2022-12-23 LAB — MAGNESIUM: Magnesium: 1.9 mg/dL (ref 1.7–2.4)

## 2022-12-23 LAB — VITAMIN B12: Vitamin B-12: 3452 pg/mL — ABNORMAL HIGH (ref 180–914)

## 2022-12-23 MED ORDER — ASPIRIN 81 MG PO CHEW
324.0000 mg | CHEWABLE_TABLET | Freq: Once | ORAL | Status: AC
  Administered 2022-12-23: 243 mg via ORAL
  Filled 2022-12-23: qty 4

## 2022-12-23 MED ORDER — FLUTICASONE PROPIONATE 50 MCG/ACT NA SUSP: 2.0000 | Freq: Every day | NASAL | Status: DC | PRN

## 2022-12-23 MED ORDER — ALBUTEROL SULFATE (2.5 MG/3ML) 0.083% IN NEBU: 2.5000 mg | INHALATION_SOLUTION | Freq: Four times a day (QID) | RESPIRATORY_TRACT | Status: DC | PRN

## 2022-12-23 MED ORDER — SODIUM CHLORIDE 0.9 % IV BOLUS
500.0000 mL | Freq: Once | INTRAVENOUS | Status: AC
  Administered 2022-12-23: 500 mL via INTRAVENOUS

## 2022-12-23 MED ORDER — PANTOPRAZOLE SODIUM 40 MG PO TBEC
40.0000 mg | DELAYED_RELEASE_TABLET | Freq: Every day | ORAL | Status: DC
  Administered 2022-12-24: 40 mg via ORAL
  Filled 2022-12-23: qty 1

## 2022-12-23 MED ORDER — LOSARTAN POTASSIUM 50 MG PO TABS
100.0000 mg | ORAL_TABLET | Freq: Every day | ORAL | Status: DC
  Administered 2022-12-24: 100 mg via ORAL
  Filled 2022-12-23: qty 2

## 2022-12-23 MED ORDER — UMECLIDINIUM-VILANTEROL 62.5-25 MCG/ACT IN AEPB
1.0000 | INHALATION_SPRAY | Freq: Every day | RESPIRATORY_TRACT | Status: DC
  Administered 2022-12-24: 1 via RESPIRATORY_TRACT
  Filled 2022-12-23: qty 14

## 2022-12-23 MED ORDER — ENOXAPARIN SODIUM 40 MG/0.4ML IJ SOSY
40.0000 mg | PREFILLED_SYRINGE | INTRAMUSCULAR | Status: DC
  Administered 2022-12-23: 40 mg via SUBCUTANEOUS
  Filled 2022-12-23 (×2): qty 0.4

## 2022-12-23 MED ORDER — ACETAMINOPHEN 650 MG RE SUPP: 650.0000 mg | RECTAL | Status: DC | PRN

## 2022-12-23 MED ORDER — METOPROLOL TARTRATE 25 MG PO TABS
25.0000 mg | ORAL_TABLET | Freq: Two times a day (BID) | ORAL | Status: DC
  Administered 2022-12-23 – 2022-12-24 (×2): 25 mg via ORAL
  Filled 2022-12-23 (×2): qty 1

## 2022-12-23 MED ORDER — SENNOSIDES-DOCUSATE SODIUM 8.6-50 MG PO TABS: 1.0000 | ORAL_TABLET | Freq: Every evening | ORAL | Status: DC | PRN

## 2022-12-23 MED ORDER — ACETAMINOPHEN 160 MG/5ML PO SOLN: 650.0000 mg | ORAL | Status: DC | PRN

## 2022-12-23 MED ORDER — AMLODIPINE BESYLATE 5 MG PO TABS
5.0000 mg | ORAL_TABLET | Freq: Every day | ORAL | Status: DC
  Administered 2022-12-24: 5 mg via ORAL
  Filled 2022-12-23: qty 1

## 2022-12-23 MED ORDER — ASPIRIN 81 MG PO TBEC
81.0000 mg | DELAYED_RELEASE_TABLET | Freq: Every day | ORAL | Status: DC
  Administered 2022-12-24: 81 mg via ORAL
  Filled 2022-12-23: qty 1

## 2022-12-23 MED ORDER — STROKE: EARLY STAGES OF RECOVERY BOOK: Freq: Once | Status: AC

## 2022-12-23 MED ORDER — IOHEXOL 350 MG/ML SOLN
75.0000 mL | Freq: Once | INTRAVENOUS | Status: AC | PRN
  Administered 2022-12-23: 75 mL via INTRAVENOUS

## 2022-12-23 MED ORDER — ACETAMINOPHEN 325 MG PO TABS: 650.0000 mg | ORAL_TABLET | ORAL | Status: DC | PRN

## 2022-12-23 NOTE — ED Provider Notes (Signed)
[facility] Provider Note    Event Date/Time   First MD Initiated Contact with Patient 12/23/22 [phone]     (approximate)   History   Numbness (Left side facial numbness )   HPI  Brooke Murphy is a 62 y.o. female past medical history significant for hypertension, hyperlipidemia, history of cerebral aneurysm status post clipping in 1999, rheumatoid arthritis, presents to the emergency department with paresthesias to the left side of her face.  Patient states that yesterday starting around 3:00 had some tightness in her head and then numbness and tingling to the left side of her face.  States that she has had mild improvement but ongoing symptoms.  States that whenever she had her aneurysm that ruptured she later had paresthesias to the left side of her face which brought her into the emergency department.  No prior history of a CVA.  No recent falls or trauma.  Denies any change in vision.  No jaw claudication.  No chest pain or shortness of breath.  Denies any extremity numbness or weakness.  No slurring of speech or trouble swallowing.  States that she had migraine headaches prior to her brain aneurysm in 1999.  Not on blood thinners.     Physical Exam   Triage Vital Signs: ED Triage Vitals  Enc Vitals Group     BP      Pulse      Resp      Temp      Temp src      SpO2      Weight      Height      Head Circumference      Peak Flow      Pain Score      Pain Loc      Pain Edu?      Excl. in [place]?     Most recent vital signs: Vitals:   12/23/22 0900 12/23/22 1030  BP: (!) 163/91 (!) 166/87  Pulse: 80 82  Resp: 18 20  Temp:    SpO2: 100% 98%    Physical Exam Constitutional:      Appearance: She is well-developed.  HENT:     Head: Atraumatic.  Eyes:     Conjunctiva/sclera: Conjunctivae normal.  Cardiovascular:     Rate and Rhythm: Regular rhythm.  Pulmonary:     Effort: No respiratory distress.  Abdominal:     General: There is no  distension.  Musculoskeletal:        General: Normal range of motion.     Cervical back: Normal range of motion.  Skin:    General: Skin is warm.  Neurological:     Mental Status: She is alert. Mental status is at baseline.     GCS: GCS eye subscore is 4. GCS verbal subscore is 5. GCS motor subscore is 6.     Cranial Nerves: Cranial nerves 2-12 are intact.     Sensory: Sensory deficit (Decree sensation to the left face) present.     Motor: Motor function is intact.     Coordination: Coordination is intact.     IMPRESSION / MDM / ASSESSMENT AND PLAN / ED COURSE  I reviewed the triage vital signs and the nursing notes.  On chart review patient had a history of a cerebral aneurysm that ruptured status post clipping 1999 at [facility]  Differential diagnosis including migraine headache, intracranial aneurysm leak, CVA, electrolyte abnormality, dehydration.  Low suspicion for giant cell arteritis, no change  in vision or jaw claudication.  EKG  I, Nathaniel Man, the attending physician, personally viewed and interpreted this ECG.   Rate: Normal  Rhythm: Normal sinus  Axis: Normal  Intervals: Normal  ST&T Change: None  No tachycardic or bradycardic dysrhythmias while on cardiac telemetry.  RADIOLOGY I independently reviewed imaging, my interpretation of imaging: CTA head and neck to evaluate for aneurysm rupture or leak.  Onset of symptoms more than 6 hours prior to arrival.  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  No signs of a rupture or clip leak, no signs of CVA.  No signs of intracranial hemorrhage.  LABS (all labs ordered are listed, but only abnormal results are displayed) Labs interpreted as -    Labs Reviewed  BASIC METABOLIC PANEL - Abnormal; Notable for the following components:      Result Value   Glucose, Bld 125 (*)    All other components within normal limits  CBC WITH DIFFERENTIAL/PLATELET  MAGNESIUM  CBG MONITORING, ED     MDM  Patient  CTA with findings concerning for new aneurysm to the left MCA.  This does not fit with the patient's left-sided facial paresthesias.  Consulted and discussed the patient's case with neurosurgery on-call Dr. Johnney Killian, reviewed images and recommended outpatient referral/follow-up with vascular neurosurgery at [facility] as an outpatient.  Patient has been evaluated in the past but has not followed up recently given the distant past of her aneurysm.  Recommended discussing with neurology given concern for possible TIA/CVA given her left-sided paresthesias.  Discussed the patient's case with neurology, Dr. Rory Percy, unable to get an MRI given that the patient has had prior clipping.  ABCD 2 score of 4.  Recommended admission to the hospitalist for further TIA/CVA workup.     PROCEDURES:  Critical Care performed: No  Procedures  Patient's presentation is most consistent with acute presentation with potential threat to life or bodily function.   MEDICATIONS ORDERED IN ED: Medications  aspirin chewable tablet 324 mg (has no administration in time range)  sodium chloride 0.9 % bolus 500 mL (500 mLs Intravenous New Bag/Given 12/23/22 1001)  iohexol (OMNIPAQUE) 350 MG/ML injection 75 mL (75 mLs Intravenous Contrast Given 12/23/22 1014)    FINAL CLINICAL IMPRESSION(S) / ED DIAGNOSES   Final diagnoses:  Paresthesia  TIA (transient ischemic attack)  Cerebral aneurysm without rupture     Rx / DC Orders   ED Discharge Orders     None        Note:  This document was prepared using Dragon voice recognition software and may include unintentional dictation errors.   Nathaniel Man, MD 12/23/22 1332

## 2022-12-23 NOTE — ED Notes (Signed)
Meal tray given 

## 2022-12-23 NOTE — Consult Note (Addendum)
Neurology Consultation  Reason for Consult: Left facial numbness Referring Physician: Dr. Namon Cirri  CC: Left face numbness, headache  History is obtained from: Patient, chart  HPI: Brooke Murphy is a 62 y.o. female past medical history of brain aneurysm status post left craniotomy for clipping for ruptured aneurysm in 1999 treated at [facility], hypertension, hyperlipidemia, hypothyroidism, rheumatoid arthritis, presented to the emergency room for evaluation of almost a day worth of headache as well as left-sided facial numbness.  She describes that she used to get headaches at the time that she had an aneurysm rupture and repaired but those headaches has since subsided.  This episode started somewhere around 3 PM yesterday which is her last known well.  She has been having discomfort/feelings of tingling on the left face which is rather new and abrupt in onset.  Does not complain of much of a headache but has a mild feeling of nagging in the head.  Denies any arm or leg numbness or weakness.  Denies any visual symptoms.  Denies any speech difficulties. Unable to get an MRI due to clips from the aneurysm clipping. Describes the feeling on the face more of numbness followed by tingling and not as pain.  Does not have any aggravating or relieving factors/the feeling has been constant.   LKW: 3 PM on 12/22/2022 IV thrombolysis given?: no, outside the window Premorbid modified Rankin scale (mRS): 0  ROS: Full ROS was performed and is negative except as noted in the HPI.   Past Medical History:  Diagnosis Date   AAA (abdominal aortic aneurysm)    Brain aneurysm 1999   [facility]   GERD (gastroesophageal reflux disease)    Hyperlipidemia    Hypertension    Hyperthyroidism    Rheumatoid arthritis 2002   s/p methotrexate, humira and embrel   Thyroid nodule    Ulcer of abdomen wall    Vitamin D deficiency      Family History  Problem Relation Age of Onset   Hypertension Mother    Aneurysm Mother 1        died   Hyperlipidemia Sister    Hypertension Sister    Cancer Father 19       throat cancer   Diabetes Maternal Aunt    Breast cancer Maternal Aunt 65   Hypertension Maternal Grandmother    Stroke Maternal Grandmother    Colon cancer Neg Hx      Social History:   reports that she has been smoking cigarettes. She has a 37.00 pack-year smoking history. She has never used smokeless tobacco. She reports that she does not drink alcohol and does not use drugs.  Medications No current facility-administered medications for this encounter.  Current Outpatient Medications:    albuterol (VENTOLIN HFA) 108 (90 Base) MCG/ACT inhaler, INHALE 2 PUFFS INTO THE LUNGS EVERY 6 (SIX) HOURS AS NEEDED FOR WHEEZING OR SHORTNESS OF BREATH (OR COUGH)., Disp: 18 each, Rfl: 2   amLODipine (NORVASC) 5 MG tablet, Take 1 tablet (5 mg total) by mouth daily., Disp: 90 tablet, Rfl: 3   aspirin EC 81 MG tablet, Take 1 tablet (81 mg total) by mouth daily. Swallow whole., Disp: 90 tablet, Rfl: 3   atorvastatin (LIPITOR) 20 MG tablet, Take 1 tablet (20 mg total) by mouth daily., Disp: 90 tablet, Rfl: 3   cyclobenzaprine (FLEXERIL) 5 MG tablet, TAKE 1 TABLET BY MOUTH AT BEDTIME AS NEEDED FOR MUSCLE SPASMS., Disp: 30 tablet, Rfl: 1   fluticasone (FLONASE) 50 MCG/ACT nasal spray, Place  2 sprays into both nostrils daily. (Patient taking differently: Place 2 sprays into both nostrils daily as needed for allergies.), Disp: 16 g, Rfl: 6   gabapentin (NEURONTIN) 100 MG capsule, TAKE 1 CAPSULE (100 MG TOTAL) BY MOUTH 3 (THREE) TIMES DAILY AS NEEDED (FOR HEADACHE/NERVE PAIN)., Disp: 90 capsule, Rfl: 0   ipratropium-albuterol (DUONEB) 0.5-2.5 (3) MG/3ML SOLN, Take 3 mLs by nebulization every 4 (four) hours as needed., Disp: 360 mL, Rfl: 1   losartan (COZAAR) 100 MG tablet, Take 1 tablet (100 mg total) by mouth daily., Disp: 90 tablet, Rfl: 3   metoprolol tartrate (LOPRESSOR) 25 MG tablet, Take 1 tablet (25 mg total) by mouth 2 (two)  times daily., Disp: 180 tablet, Rfl: 3   omeprazole (PRILOSEC) 20 MG capsule, TAKE 1 CAPSULE BY MOUTH EVERY DAY, Disp: 90 capsule, Rfl: 3   triamcinolone (KENALOG) 0.025 % cream, Apply 1 application topically 2 (two) times daily., Disp: 15 g, Rfl: 2   umeclidinium-vilanterol (ANORO ELLIPTA) 62.5-25 MCG/ACT AEPB, Inhale 1 puff into the lungs daily., Disp: 60 each, Rfl: 0   Exam: Current vital signs: BP (!) 171/87   Pulse 73   Temp 97.8 F (36.6 C) (Oral)   Resp 15   Ht 5\' 5"  (1.651 m)   Wt 61.2 kg   SpO2 100%   BMI 22.47 kg/m  Vital signs in last 24 hours: Temp:  [97.8 F (36.6 C)] 97.8 F (36.6 C) (04/01 0827) Pulse Rate:  [73-82] 73 (04/01 1348) Resp:  [15-20] 15 (04/01 1348) BP: (163-178)/(87-91) 171/87 (04/01 1348) SpO2:  [98 %-100 %] 100 % (04/01 1348) Weight:  [61.2 kg] 61.2 kg (04/01 0815) General: Awake alert in no distress HEENT: Normocephalic, atraumatic, no palpable tenderness of the face, palpable temporal pulses. CVS: Regular rhythm Abdomen nondistended nontender Extremities warm well-perfused Neurological exam Awake alert oriented x 3.  No dysarthria.  No aphasia. Cranial nerves: Pupils equal round react light, extraocular movements intact, visual fields full, facial sensation diminished to light touch on the left face, face symmetric, tongue and palate midline. Motor examination with no drift Sensation: Other than the facial asymmetry and sensation, no other deficits. Coordination with no dysmetria NIH stroke scale-1 for sensory  Labs I have reviewed labs in epic and the results pertinent to this consultation are:  CBC    Component Value Date/Time   WBC 5.3 12/23/2022 0844   RBC 4.85 12/23/2022 0844   HGB 14.7 12/23/2022 0844   HGB 15.5 10/11/2014 1354   HCT 45.6 12/23/2022 0844   HCT 48.1 (H) 10/11/2014 1354   PLT 317 12/23/2022 0844   PLT 324 10/11/2014 1354   MCV 94.0 12/23/2022 0844   MCV 92 10/11/2014 1354   MCH 30.3 12/23/2022 0844   MCHC  32.2 12/23/2022 0844   RDW 13.5 12/23/2022 0844   RDW 14.4 10/11/2014 1354   LYMPHSABS 2.0 12/23/2022 0844   LYMPHSABS 2.9 10/11/2014 1354   MONOABS 0.3 12/23/2022 0844   MONOABS 0.5 10/11/2014 1354   EOSABS 0.0 12/23/2022 0844   EOSABS 0.0 10/11/2014 1354   BASOSABS 0.0 12/23/2022 0844   BASOSABS 0.1 10/11/2014 1354    CMP     Component Value Date/Time   NA 136 12/23/2022 0844   NA 135 (L) 10/11/2014 1354   K 4.5 12/23/2022 0844   K 3.9 10/11/2014 1354   CL 101 12/23/2022 0844   CL 98 10/11/2014 1354   CO2 25 12/23/2022 0844   CO2 32 10/11/2014 1354   GLUCOSE 125 (  H) 12/23/2022 0844   GLUCOSE 88 10/11/2014 1354   BUN 11 12/23/2022 0844   BUN 8 10/11/2014 1354   CREATININE 0.62 12/23/2022 0844   CREATININE 0.84 10/11/2014 1354   CALCIUM 9.8 12/23/2022 0844   CALCIUM 9.6 10/11/2014 1354   PROT 7.1 11/25/2022 0947   PROT 8.6 (H) 07/13/2013 2330   ALBUMIN 4.4 11/25/2022 0947   ALBUMIN 4.3 07/13/2013 2330   AST 19 11/25/2022 0947   AST 17 07/13/2013 2330   ALT 12 11/25/2022 0947   ALT 13 07/13/2013 2330   ALKPHOS 71 11/25/2022 0947   ALKPHOS 109 07/13/2013 2330   BILITOT 0.9 11/25/2022 0947   BILITOT 0.3 07/13/2013 2330   GFRNONAA >60 12/23/2022 0844   GFRNONAA >60 10/11/2014 1354   GFRNONAA >60 07/13/2013 2330   GFRAA >60 11/04/2018 1230   GFRAA >60 10/11/2014 1354   GFRAA >60 07/13/2013 2330    Lipid Panel     Component Value Date/Time   CHOL 155 11/25/2022 0947   TRIG 151.0 (H) 11/25/2022 0947   HDL 66.10 11/25/2022 0947   CHOLHDL 2 11/25/2022 0947   VLDL 30.2 11/25/2022 0947   LDLCALC 58 11/25/2022 0947     Imaging I have reviewed the images obtained:  CT-head, CT angiography head and neck: There is a new 3 x 3 x 3 mm saccular aneurysm arising from the left MCA bifurcation.  Postsurgical changes from prior left supraclinoid ICA aneurysm clipping.  No evidence of recurrent aneurysm in that region.  No acute abnormality.  No hemodynamically significant  stenosis in the neck.  Assessment:  62 year old past history of brain aneurysm status post left craniotomy for clipping for the ruptured aneurysm in the left MCA in 1999 treated at [facility], hypertension, hyperlipidemia, hypothyroidism, rheumatoid arthritis presenting for nearly a days worth of left-sided facial numbness followed by tingling.  No palpable tenderness.  No complaints of tenderness. Unable to get an MRI due to the aneurysm clips. Will evaluate for atypical face paresthesias versus small thalamic stroke versus thalamic TIA.  Recommendations: Admit to hospitalist Frequent neurochecks Telemetry On aspirin at home-will continue that for now.  Will avoid dual antiplatelets given prior history of ruptured aneurysm as well as not a fully convincing story for TIA although TIA remains on the differential. 2D echo A1c Lipid panel.  High intensity statin for LDL greater than 70. Repeat CT head in 24 hours to look for any evidence of evolving infarct PT OT Speech therapy Blood pressure parameters: Will allow for permissive hypertension but given prior history of ruptured aneurysm status post clipping and a possible new saccular aneurysm in the left MCA, the ceiling of permissive hypertension would be lower than usual.  I would recommend going no higher than systolic blood pressure [id number]. I will continue to follow with you Plan relayed to Dr. Mal Misty    -- Amie Portland, MD Neurologist [facility] Pager: [phone]

## 2022-12-23 NOTE — Progress Notes (Signed)
Neurosurgery brief note Was contacted by the emergency room regarding this patient.  She is apparently a 62 year old right-handed female past medical history significant for a left craniotomy for aneurysm clipping after a prior ruptured aneurysm in 1999 which was treated at [facility] per her report who presented to the emergency room with left sided total facial numbness that per the report started at 3 PM yesterday.  She denies any headaches. 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  No obvious right vascular tree lesion. Per the report she is otherwise awake fluent and appropriate and neurologically intact.  It is odd to me that that she is experiencing left-sided facial numbness when her previous aneurysm clipping as well as newly diagnosed small left MCA aneurysm identification is also on the left-hand side.  Additional causes need to be ruled out.  I recommended the patient have an MRI image study of the brain to better evaluate for potential underlying TIA or stroke.  I also recommended neurology be consulted.  I also recommended blood pressure control as the patient systolic blood pressures are apparently slightly above 160 and likely needs some adjustment.  But I defer that to the primary team.  Ongoing workup likely per neurology with the need ultimately for the patient to have follow-up with [facility] for follow-up for her previously clipped aneurysm as well as his newly diagnosed MCA aneurysm.  Luciano Cutter. Johnney Killian, M.D. Neurosurgery

## 2022-12-23 NOTE — H&P (Addendum)
History and Physical:    Brooke Murphy   [id number] DOB: 05-17-1961 DOA: 12/23/2022  Referring MD/provider: Nathaniel Man, MD PCP: Burnard Hawthorne, FNP   Patient coming from: Home  Chief Complaint: Tingling and numbness on the left side of the face  History of Present Illness:   Brooke Murphy is a 62 y.o. female with medical history significant for abdominal aortic aneurysm, prior ruptured aneurysm in 1999 s/p left craniotomy for aneurysm clipping, COPD/emphysema, hypertension, hyperlipidemia, prediabetes, hypothyroidism, rheumatoid arthritis in remission, thyroid nodule, vitamin D deficiency, who presented to the hospital with tingling and numbness on the left side of her face.  Symptoms started yesterday (on 12/22/2022 around 3:30 PM).  She also noticed that squinting of the left eye causes some pressure tenderness on the left side of her face in the area lateral to the left eye.  No other complaints.  No numbness, tingling or weakness in the upper or lower extremities, no changes in speech or vision, no chest pain, shortness of breath, palpitations, cough wheezing, vomiting or diarrhea.   ED Course:  The patient was given chewable aspirin and normal saline bolus in the emergency department.  ROS:   ROS systems reviewed were negative  Past Medical History:   Past Medical History:  Diagnosis Date   AAA (abdominal aortic aneurysm)    Brain aneurysm 1999   [facility]   GERD (gastroesophageal reflux disease)    Hyperlipidemia    Hypertension    Hyperthyroidism    Rheumatoid arthritis 2002   s/p methotrexate, humira and embrel   Thyroid nodule    Ulcer of abdomen wall    Vitamin D deficiency     Past Surgical History:   Past Surgical History:  Procedure Laterality Date   ABDOMINAL HYSTERECTOMY  2005   ovaries remain; WITH CERVIX   BRAIN SURGERY Left 11/27/1997   aneurysm   COLONOSCOPY     LAPAROSCOPIC BILATERAL SALPINGO OOPHERECTOMY  11/11/2018   Procedure: LAPAROSCOPIC  BILATERAL SALPINGO OOPHORECTOMY;  Surgeon: Benjaman Kindler, MD;  Location: [facility] ORS;  Service: Gynecology;;    Social History:   Social History   Socioeconomic History   Marital status: Single    Spouse name: Not on file   Number of children: 5   Years of education: Not on file   Highest education level: Not on file  Occupational History   Not on file  Tobacco Use   Smoking status: Every Day    Packs/day: 1.00    Years: 37.00    Additional pack years: 0.00    Total pack years: 37.00    Types: Cigarettes   Smokeless tobacco: Never   Tobacco comments:    1 pack will last 1.5wk---04/04/2022  Vaping Use   Vaping Use: Never used  Substance and Sexual Activity   Alcohol use: No    Alcohol/week: 0.0 standard drinks of alcohol   Drug use: No   Sexual activity: Not Currently  Other Topics Concern   Not on file  Social History Narrative   [employer]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[employer]; Pharmacologist   Lives with Aunt      Social Determinants of Health   Financial Resource Strain: Not on file  Food Insecurity: Not on file  Transportation Needs: Not on file  Physical Activity: Not on file  Stress: Not on file  Social Connections: Not on file  Intimate Partner Violence: Not on file    Allergies   Celebrex [celecoxib], Lodine [etodolac], and Penicillins  Family history:  Family History  Problem Relation Age of Onset   Hypertension Mother    Aneurysm Mother 59       died   Hyperlipidemia Sister    Hypertension Sister    Cancer Father 72       throat cancer   Diabetes Maternal Aunt    Breast cancer Maternal Aunt 65   Hypertension Maternal Grandmother    Stroke Maternal Grandmother    Colon cancer Neg Hx     Current Medications:   Prior to Admission medications   Medication Sig Start Date End Date Taking? Authorizing Provider  albuterol (VENTOLIN HFA) 108 (90 Base) MCG/ACT inhaler INHALE 2 PUFFS INTO THE LUNGS EVERY 6 (SIX) HOURS AS NEEDED FOR WHEEZING OR SHORTNESS OF  BREATH (OR COUGH). 07/05/22   Tyler Pita, MD  amLODipine (NORVASC) 5 MG tablet Take 1 tablet (5 mg total) by mouth daily. 11/25/22   Burnard Hawthorne, FNP  aspirin EC 81 MG tablet Take 1 tablet (81 mg total) by mouth daily. Swallow whole. 05/31/22   Kate Sable, MD  atorvastatin (LIPITOR) 20 MG tablet Take 1 tablet (20 mg total) by mouth daily. 11/25/22   Burnard Hawthorne, FNP  cyclobenzaprine (FLEXERIL) 5 MG tablet TAKE 1 TABLET BY MOUTH AT BEDTIME AS NEEDED FOR MUSCLE SPASMS. 05/31/22   Burnard Hawthorne, FNP  fluticasone (FLONASE) 50 MCG/ACT nasal spray Place 2 sprays into both nostrils daily. Patient taking differently: Place 2 sprays into both nostrils daily as needed for allergies. 04/26/14   Rey, Latina Craver, NP  gabapentin (NEURONTIN) 100 MG capsule TAKE 1 CAPSULE (100 MG TOTAL) BY MOUTH 3 (THREE) TIMES DAILY AS NEEDED (FOR HEADACHE/NERVE PAIN). 08/12/18   Burnard Hawthorne, FNP  ipratropium-albuterol (DUONEB) 0.5-2.5 (3) MG/3ML SOLN Take 3 mLs by nebulization every 4 (four) hours as needed. 09/20/18   Caryn Section Linden Dolin, PA-C  losartan (COZAAR) 100 MG tablet Take 1 tablet (100 mg total) by mouth daily. 11/25/22   Burnard Hawthorne, FNP  metoprolol tartrate (LOPRESSOR) 25 MG tablet Take 1 tablet (25 mg total) by mouth 2 (two) times daily. 11/25/22   Burnard Hawthorne, FNP  omeprazole (PRILOSEC) 20 MG capsule TAKE 1 CAPSULE BY MOUTH EVERY DAY 11/25/22   Burnard Hawthorne, FNP  triamcinolone (KENALOG) 0.025 % cream Apply 1 application topically 2 (two) times daily. 02/05/21   Burnard Hawthorne, FNP  umeclidinium-vilanterol (ANORO ELLIPTA) 62.5-25 MCG/ACT AEPB Inhale 1 puff into the lungs daily. 04/04/22   Tyler Pita, MD    Physical Exam:   Vitals:   12/23/22 0827 12/23/22 0900 12/23/22 1030 12/23/22 1348  BP:  (!) 163/91 (!) 166/87 (!) 171/87  Pulse:  80 82 73  Resp:  18 20 15   Temp: 97.8 F (36.6 C)     TempSrc: Oral     SpO2:  100% 98% 100%  Weight:      Height:          Physical Exam: Blood pressure (!) 171/87, pulse 73, temperature 97.8 F (36.6 C), temperature source Oral, resp. rate 15, height 5\' 5"  (1.651 m), weight 61.2 kg, SpO2 100 %. Gen: No acute distress. Head: Normocephalic, atraumatic. Eyes: Pupils equal, round and reactive to light. Extraocular movements intact.  Sclerae nonicteric.  Mouth: Moist mucous membranes Neck: Supple, no thyromegaly, no lymphadenopathy, no jugular venous distention. Chest: Lungs are clear to auscultation with good air movement. No rales, rhonchi or wheezes.  CV: Heart sounds are regular with an S1, S2. No  murmurs, rubs or gallops.  Abdomen: Soft, nontender, nondistended with normal active bowel sounds. No palpable masses. Extremities: Extremities are without clubbing, or cyanosis. No edema. Pedal pulses 2+.  Skin: Warm and dry. No rashes, lesions or wounds Neuro: Alert and oriented times 3; grossly nonfocal.  Psych: Insight is good and judgment is appropriate. Mood and affect normal.   Data Review:    Labs: Basic Metabolic Panel: Recent Labs  Lab 12/23/22 0844  NA 136  K 4.5  CL 101  CO2 25  GLUCOSE 125*  BUN 11  CREATININE 0.62  CALCIUM 9.8  MG 1.9   Liver Function Tests: No results for input(s): "AST", "ALT", "ALKPHOS", "BILITOT", "PROT", "ALBUMIN" in the last 168 hours. No results for input(s): "LIPASE", "AMYLASE" in the last 168 hours. No results for input(s): "AMMONIA" in the last 168 hours. CBC: Recent Labs  Lab 12/23/22 0844  WBC 5.3  NEUTROABS 2.9  HGB 14.7  HCT 45.6  MCV 94.0  PLT 317   Cardiac Enzymes: No results for input(s): "CKTOTAL", "CKMB", "CKMBINDEX", "TROPONINI" in the last 168 hours.  BNP (last 3 results) No results for input(s): "PROBNP" in the last 8760 hours. CBG: Recent Labs  Lab 12/23/22 0824  GLUCAP 98    Urinalysis    Component Value Date/Time   COLORURINE STRAW (A) 10/02/2018 0844   APPEARANCEUR HAZY (A) 10/02/2018 0844   APPEARANCEUR Clear  10/11/2014 1354   LABSPEC 1.006 10/02/2018 0844   LABSPEC 1.003 10/11/2014 1354   PHURINE 6.0 10/02/2018 0844   GLUCOSEU NEGATIVE 10/02/2018 0844   GLUCOSEU Negative 10/11/2014 1354   HGBUR NEGATIVE 10/02/2018 0844   BILIRUBINUR 1+ 10/09/2018 0816   BILIRUBINUR Negative 10/11/2014 [place] 10/02/2018 0844   PROTEINUR Positive (A) 10/09/2018 0816   PROTEINUR NEGATIVE 10/02/2018 0844   UROBILINOGEN 1.0 10/09/2018 0816   NITRITE neg 10/09/2018 0816   NITRITE NEGATIVE 10/02/2018 0844   LEUKOCYTESUR Negative 10/09/2018 0816   LEUKOCYTESUR Negative 10/11/2014 1354      Radiographic Studies: CT Angio Head Neck W WO CM  Result Date: 12/23/2022 CLINICAL DATA:  TIA. Left-sided numbness. History of aneurysm rupture. EXAM: CT ANGIOGRAPHY HEAD AND NECK TECHNIQUE: Multidetector CT imaging of the head and neck was performed using the standard protocol during bolus administration of intravenous contrast. Multiplanar CT image reconstructions and MIPs were obtained to evaluate the vascular anatomy. Carotid stenosis measurements (when applicable) are obtained utilizing NASCET criteria, using the distal internal carotid diameter as the denominator. RADIATION DOSE REDUCTION: This exam was performed according to the departmental dose-optimization program which includes automated exposure control, adjustment of the mA and/or kV according to patient size and/or use of iterative reconstruction technique. CONTRAST:  72mL OMNIPAQUE IOHEXOL 350 MG/ML SOLN COMPARISON:  CT Angio Neck 01/01/18. FINDINGS: CT HEAD FINDINGS Brain: No evidence of acute infarction, hemorrhage, hydrocephalus, extra-axial collection or mass lesion/mass effect. Vascular: Postsurgical changes from prior left supraclinoid ICA aneurysm clipping. Skull: Status post left pterional craniotomy. Sinuses/Orbits: No acute finding. Other: None. Review of the MIP images confirms the above findings CTA NECK FINDINGS Aortic arch: Standard  branching. Imaged portion shows no evidence of aneurysm or dissection. No significant stenosis of the major arch vessel origins. Right carotid system: No evidence of dissection, stenosis (50% or greater), or occlusion. Left carotid system: No evidence of dissection, stenosis (50% or greater), or occlusion. Previously seen plaque of the left carotid bifurcation has resolved. Vertebral arteries: Left dominant. No evidence of dissection, stenosis (50% or greater), or occlusion.  Skeleton: Negative. Other neck: Negative. Upper chest: Small volume tracheal secretions are present. Severe centrilobular and paraseptal emphysema with a large biapical bulla. No focal pulmonary nodule is visualized. 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 Posterior circulation: No significant stenosis, proximal occlusion, aneurysm, or vascular malformation. Venous sinuses: As permitted by contrast timing, patent. Anatomic variants: Fetal PCA on the right. Review of the MIP images confirms the above findings IMPRESSION: 1. 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 2. Postsurgical changes from prior left supraclinoid ICA aneurysm clipping. No evidence of recurrent aneurysm in this region. 3. No acute intracranial abnormality. 4. No hemodynamically significant stenosis in the neck. 5. Emphysema. Emphysema (ICD10-J43.9). Electronically Signed   By: Marin Roberts M.D.   On: 12/23/2022 10:41    EKG: Independently reviewed by me showed normal sinus rhythm.    Assessment/Plan:   Principal Problem:   Left facial numbness    Body mass index is 22.47 kg/m.   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  Monitor on telemetry.  Recommendations from Dr. Johnney Killian, neurosurgeon, and Dr. Rory Percy, neurologist, have been noted.  Neurochecks, obtain 2D echo for further evaluation.  Repeat CT head in 24 hours. PT, OT and speech therapy consult   Hypertension: Resume home antihypertensives.   Prediabetes: Hemoglobin A1c was 6.2 on 11/25/2022.  No need repeat hemoglobin A1c at this time.   Hyperlipidemia: Lipid panel showed cholesterol 155, triglycerides 151, HDL 66, LDL 58 on 11/25/2022.  No need to repeat a lipid panel at this time.   COPD/emphysema: Compensated.  Continue bronchodilators    Other information:   DVT prophylaxis: enoxaparin (LOVENOX) injection 40 mg Start: 12/23/22 1445  Code Status: DNR.  CODE STATUS was discussed with the patient interpreter Family Communication: None  Disposition Plan: Plan to discharge home tomorrow Consults called: Neurosurgeon, neurologist Admission status: Observation     Joslyn Ramos [facility] Pager: Please check [email or web address]   How to contact the [facility] Attending or Consulting provider [place] or covering provider during after hours [place], for this patient?   Check the care team in [facility] and look for a) attending/consulting [facility] provider listed and b) the [facility] team listed Log into [email or web address] and use [place]'s universal password to access. If you do not have the password, please contact the hospital operator. Locate the [facility] provider you are looking for under [facility] and page to a number that you can be directly reached. If you still have difficulty reaching the provider, please page the [employer] (Director on Call) for the Hospitalists listed on amion for assistance.  12/23/2022, 2:39 PM

## 2022-12-23 NOTE — ED Notes (Signed)
Pt ambulatory to bathroom independently with steady gait.

## 2022-12-23 NOTE — ED Triage Notes (Signed)
Pt to ED via POV from home. Pt reports left sided facial numbness that started yesterday at 3pm. Pt denies hx of stroke and not currently on blood thinners. Pt reports hx of aortic aneurysm. Pt reports numbness has improved but still present.

## 2022-12-24 ENCOUNTER — Observation Stay: Payer: 59

## 2022-12-24 ENCOUNTER — Observation Stay (HOSPITAL_BASED_OUTPATIENT_CLINIC_OR_DEPARTMENT_OTHER)
Admit: 2022-12-24 | Discharge: 2022-12-24 | Disposition: A | Discharge status: Home / Self Care | Payer: 59 | Attending: Internal Medicine | Admitting: Internal Medicine

## 2022-12-24 DIAGNOSIS — I671 Cerebral aneurysm, nonruptured: Secondary | ICD-10-CM

## 2022-12-24 DIAGNOSIS — R748 Abnormal levels of other serum enzymes: Secondary | ICD-10-CM | POA: Diagnosis not present

## 2022-12-24 DIAGNOSIS — R202 Paresthesia of skin: Secondary | ICD-10-CM | POA: Diagnosis not present

## 2022-12-24 DIAGNOSIS — G459 Transient cerebral ischemic attack, unspecified: Secondary | ICD-10-CM

## 2022-12-24 DIAGNOSIS — R200 Anesthesia of skin: Secondary | ICD-10-CM | POA: Diagnosis not present

## 2022-12-24 LAB — ECHOCARDIOGRAM COMPLETE
AR max vel: 2.11 cm2
AV Area VTI: 2.43 cm2
AV Area mean vel: 2.12 cm2
AV Mean grad: 4 mmHg
AV Peak grad: 9.2 mmHg
Ao pk vel: 1.52 m/s
Area-P 1/2: 2.39 cm2
Height: 65 in
MV VTI: 2.6 cm2
S' Lateral: 2 cm
Weight: 2160 oz

## 2022-12-24 NOTE — Progress Notes (Signed)
*  PRELIMINARY RESULTS* Echocardiogram 2D Echocardiogram has been performed.  Brooke Murphy 12/24/2022, 12:52 PM

## 2022-12-24 NOTE — Progress Notes (Signed)
SLP Cancellation Note  Patient Details Name: Brooke Murphy MRN: [id number] DOB: 28-Feb-1961   Cancelled treatment:       Reason Eval/Treat Not Completed: SLP screened, no needs identified, will sign off. Consult received and appreciated. Met w/ pt in room. Pt laying in bed speaking on the phone upon ST arrival. Pt readily conversed w/ ST regarding her status and expressed she has no difficulties w/ speech, language, nor swallowing. Pt easily expressed wants/needs. Noted normal vocal quality and pragmatic language. Pt encouraged to reach out to PCP if she's notices any changes after d/c upon return to ADLs. Pt appreciative and agreed.  No acute ST needs at this time. ST will s/o. MD to reconsult if any new need arise during this admission.   Randall Hiss Graduate Clinician [place], Speech Pathology   Randall Hiss 12/24/2022, 11:42 AM

## 2022-12-24 NOTE — Progress Notes (Signed)
Patient transferred to CT in stable condition.

## 2022-12-24 NOTE — Progress Notes (Signed)
Patient received from CT in stable condition.

## 2022-12-24 NOTE — Discharge Summary (Incomplete)
Physician Discharge Summary   Patient: Brooke Murphy MRN: [id number] DOB: 09/30/1960  Admit date:     12/23/2022  Discharge date: 12/24/22  Discharge Physician: Jennye Boroughs   PCP: Burnard Hawthorne, FNP   Recommendations at discharge:  {Tip this will not be part of the note when signed- Example include specific recommendations for outpatient follow-up, pending tests to follow-up on. (Optional):26781} Follow-up with PCP in 1 to 2 weeks Avoid vitamin B12 supplement because of very high levels of vitamin B12. Follow-up with Dr. Dorethea Clan, neurosurgeon, at [facility] as scheduled on Wednesday, 01/01/2023. Follow-up with your neurologist at [facility] clinic in 8 to 12 weeks.  Discharge Diagnoses: Principal Problem:   Left facial numbness Active Problems:   Elevated vitamin B12 level  Resolved Problems:   * No resolved hospital problems. 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  Monitor on telemetry.  Recommendations from Dr. Johnney Killian, neurosurgeon, and Dr. Rory Percy, neurologist, have been noted.  Neurochecks, obtain 2D echo for further evaluation.  Repeat CT head in 24 hours. PT, OT and speech therapy consult     Hypertension: Resume home antihypertensives.   Elevated vitamin B12 level: B12 was 3452.  Patient said she takes vitamin B12 supplement although she does not have a history of vitamin B12 deficiency.  She has been advised to discontinue vitamin B12 supplement.     Prediabetes: Hemoglobin A1c was 6.2 on 11/25/2022.  No need repeat hemoglobin A1c at this time.  Educated on the importance of dietary discretion and healthy lifestyle     Hyperlipidemia: Lipid panel showed cholesterol 155, triglycerides 151, HDL 66, LDL 58 on  11/25/2022.  No need to repeat a lipid panel at this time.     COPD/emphysema: Compensated.  Continue bronchodilators          {Tip this will not be part of the note when signed Body mass index is 22.47 kg/m. , ,  (Optional):26781}  {(NOTE) Pain control PDMP Statment (Optional):26782} Consultants: *** Procedures performed: ***  Disposition: {Plan; Disposition:26390} Diet recommendation:  {Diet_Plan:26776} DISCHARGE MEDICATION: Allergies as of 12/24/2022       Reactions   Celebrex [celecoxib] Rash   Rash. Upset stomach   Lodine [etodolac] Rash   Rash. Upset stomach   Penicillins Rash   Has patient had a PCN reaction causing immediate rash, facial/tongue/throat swelling, SOB or lightheadedness with hypotension: Yes Has patient had a PCN reaction causing severe rash involving mucus membranes or skin necrosis: No Has patient had a PCN reaction that required hospitalization: No Has patient had a PCN reaction occurring within the last 10 years: No If all of the above answers are "NO", then may proceed with Cephalosporin use.     Med Rec must be completed prior to using this [facility]***       Discharge Exam: Filed Weights   12/23/22 0815  Weight: 61.2 kg   ***  Condition at discharge: {DC Condition:26389}  The results of significant diagnostics from this hospitalization (including imaging, microbiology, ancillary and laboratory) are listed below for reference.   Imaging Studies: CT HEAD WO CONTRAST (5MM)  Result Date: 12/24/2022 CLINICAL DATA:  Left facial tingling and EXAM: CT HEAD WITHOUT CONTRAST TECHNIQUE: Contiguous axial images were obtained from the base of  the skull through the vertex without intravenous contrast. RADIATION DOSE REDUCTION: This exam was performed according to the departmental dose-optimization program which includes automated exposure control, adjustment of the mA and/or kV according to patient size and/or use of iterative reconstruction  technique. COMPARISON:  CT Angio 12/23/22 FINDINGS: Brain: Status post left pterional craniotomy for ICA aneurysm clipping. No hemorrhage. No CT evidence of a new infarct. No hydrocephalus. No extra-axial fluid collection. No mass lesion. Vascular: No hyperdense vessel or unexpected calcification. Skull: Left pterional craniotomy. Negative for fracture or focal lesion. Sinuses/Orbits: No acute finding. Other: None IMPRESSION: No acute intracranial abnormality. Electronically Signed   By: Marin Roberts M.D.   On: 12/24/2022 10:27   CT Angio Head Neck W WO CM  Result Date: 12/23/2022 CLINICAL DATA:  TIA. Left-sided numbness. History of aneurysm rupture. EXAM: CT ANGIOGRAPHY HEAD AND NECK TECHNIQUE: Multidetector CT imaging of the head and neck was performed using the standard protocol during bolus administration of intravenous contrast. Multiplanar CT image reconstructions and MIPs were obtained to evaluate the vascular anatomy. Carotid stenosis measurements (when applicable) are obtained utilizing NASCET criteria, using the distal internal carotid diameter as the denominator. RADIATION DOSE REDUCTION: This exam was performed according to the departmental dose-optimization program which includes automated exposure control, adjustment of the mA and/or kV according to patient size and/or use of iterative reconstruction technique. CONTRAST:  71mL OMNIPAQUE IOHEXOL 350 MG/ML SOLN COMPARISON:  CT Angio Neck 01/01/18. FINDINGS: CT HEAD FINDINGS Brain: No evidence of acute infarction, hemorrhage, hydrocephalus, extra-axial collection or mass lesion/mass effect. Vascular: Postsurgical changes from prior left supraclinoid ICA aneurysm clipping. Skull: Status post left pterional craniotomy. Sinuses/Orbits: No acute finding. Other: None. Review of the MIP images confirms the above findings CTA NECK FINDINGS Aortic arch: Standard branching. Imaged portion shows no evidence of aneurysm or dissection. No significant stenosis of the  major arch vessel origins. Right carotid system: No evidence of dissection, stenosis (50% or greater), or occlusion. Left carotid system: No evidence of dissection, stenosis (50% or greater), or occlusion. Previously seen plaque of the left carotid bifurcation has resolved. Vertebral arteries: Left dominant. No evidence of dissection, stenosis (50% or greater), or occlusion. Skeleton: Negative. Other neck: Negative. Upper chest: Small volume tracheal secretions are present. Severe centrilobular and paraseptal emphysema with a large biapical bulla. No focal pulmonary nodule is visualized. 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 Posterior circulation: No significant stenosis, proximal occlusion, aneurysm, or vascular malformation. Venous sinuses: As permitted by contrast timing, patent. Anatomic variants: Fetal PCA on the right. Review of the MIP images confirms the above findings IMPRESSION: 1. 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 2. Postsurgical changes from prior left supraclinoid ICA aneurysm clipping. No evidence of recurrent aneurysm in this region. 3. No acute intracranial abnormality. 4. No hemodynamically significant stenosis in the neck. 5. Emphysema. Emphysema (ICD10-J43.9). Electronically Signed   By: Marin Roberts M.D.   On: 12/23/2022 10:41   MM 3D SCREEN BREAST BILATERAL  Result Date: 12/16/2022 CLINICAL DATA:  Screening. EXAM: DIGITAL SCREENING BILATERAL MAMMOGRAM WITH TOMOSYNTHESIS AND CAD TECHNIQUE: Bilateral screening digital craniocaudal and mediolateral oblique mammograms were obtained. Bilateral screening digital breast tomosynthesis was performed. The images were evaluated with computer-aided detection. COMPARISON:  Previous exam(s). ACR Breast Density  Category b: There are  scattered areas of fibroglandular density. FINDINGS: There are no findings suspicious for malignancy. IMPRESSION: No mammographic evidence of malignancy. A result letter of this screening mammogram will be mailed directly to the patient. RECOMMENDATION: Screening mammogram in one year. (Code:[id number]-01Y) BI-RADS CATEGORY  1: Negative. Electronically Signed   By: Lajean Manes M.D.   On: 12/16/2022 15:28    Microbiology: Results for orders placed or performed in visit on 10/09/18  Urine Culture     Status: None   Collection Time: 10/09/18  8:39 AM   Specimen: Urine  Result Value Ref Range Status   MICRO NUMBER: [id number]  Final   SPECIMEN QUALITY: Adequate  Final   Sample Source URINE  Final   STATUS: FINAL  Final   Result: No Growth  Final    Labs: CBC: Recent Labs  Lab 12/23/22 0844  WBC 5.3  NEUTROABS 2.9  HGB 14.7  HCT 45.6  MCV 94.0  PLT [id number]   Basic Metabolic Panel: Recent Labs  Lab 12/23/22 0844  NA 136  K 4.5  CL 101  CO2 25  GLUCOSE 125*  BUN 11  CREATININE 0.62  CALCIUM 9.8  MG 1.9   Liver Function Tests: No results for input(s): "AST", "ALT", "ALKPHOS", "BILITOT", "PROT", "ALBUMIN" in the last 168 hours. CBG: Recent Labs  Lab 12/23/22 0824  GLUCAP 98    Discharge time spent: {LESS THAN/GREATER THAN:26388} 30 minutes.  Signed: Jennye Boroughs, MD [facility] 12/24/2022

## 2022-12-24 NOTE — Progress Notes (Signed)
Dr. Johnney Killian has communicated with Dr. Dorethea Clan at [facility] and arranged outpatient follow up regarding her aneurysm.  His contact information has been added to her discharge paperwork.  Cooper Render PA-C

## 2022-12-24 NOTE — TOC CM/SW Note (Signed)
  Transition of Care [facility]) Screening Note   Patient Details  Name: Brooke Murphy Date of Birth: Jul 01, 1961   Transition of Care [facility]) CM/SW Contact:    Gerilyn Pilgrim, LCSW Phone Number: 12/24/2022, 9:08 AM    Transition of Care Department [facility]) has reviewed patient and no TOC needs have been identified at this time. We will continue to monitor patient advancement through interdisciplinary progression rounds. If new patient transition needs arise, please place a TOC consult.

## 2022-12-24 NOTE — Progress Notes (Addendum)
Neurology Progress Note   S:// Seen and examined.  Facial numbness appears better   O:// Current vital signs: BP (!) 143/88 (BP Location: Left Arm)   Pulse 69   Temp 98.2 F (36.8 C) (Oral)   Resp 16   Ht 5\' 5"  (1.651 m)   Wt 61.2 kg   SpO2 98%   BMI 22.47 kg/m  Vital signs in last 24 hours: Temp:  [98 F (36.7 C)-98.2 F (36.8 C)] 98.2 F (36.8 C) (04/02 0747) Pulse Rate:  [69-89] 69 (04/02 0747) Resp:  [15-21] 16 (04/02 0747) BP: (132-171)/(80-94) 143/88 (04/02 0747) SpO2:  [93 %-100 %] 98 % (04/02 0747) General: Awake alert in no distress HEENT: Normocephalic atraumatic Lungs: Clear Cardiovascular: Regular rhythm Neurologic exam Awake alert oriented x 3.  No dysarthria.  No aphasia.  Cranial nerves II to XII intact.  Facial tingling on the left side is improved.  Motor examination with no drift.  Sensory exam unremarkable.  Coordination exam with no dysmetria. NIH stroke scale-0  Medications  Current Facility-Administered Medications:    acetaminophen (TYLENOL) tablet 650 mg, 650 mg, Oral, Q4H PRN **OR** acetaminophen (TYLENOL) 160 MG/5ML solution 650 mg, 650 mg, Per Tube, Q4H PRN **OR** acetaminophen (TYLENOL) suppository 650 mg, 650 mg, Rectal, Q4H PRN, Jennye Boroughs, MD   albuterol (PROVENTIL) (2.5 MG/3ML) 0.083% nebulizer solution 2.5 mg, 2.5 mg, Inhalation, Q6H PRN, Jennye Boroughs, MD   amLODipine (NORVASC) tablet 5 mg, 5 mg, Oral, Daily, Jennye Boroughs, MD, 5 mg at 12/24/22 0813   aspirin EC tablet 81 mg, 81 mg, Oral, Daily, Jennye Boroughs, MD, 81 mg at 12/24/22 0814   enoxaparin (LOVENOX) injection 40 mg, 40 mg, Subcutaneous, Q24H, Jennye Boroughs, MD, 40 mg at 12/23/22 1614   fluticasone (FLONASE) 50 MCG/ACT nasal spray 2 spray, 2 spray, Each Nare, Daily PRN, Jennye Boroughs, MD   losartan (COZAAR) tablet 100 mg, 100 mg, Oral, Daily, Jennye Boroughs, MD, 100 mg at 12/24/22 0814   metoprolol tartrate (LOPRESSOR) tablet 25 mg, 25 mg, Oral, BID, Jennye Boroughs, MD,  25 mg at 12/24/22 0814   pantoprazole (PROTONIX) EC tablet 40 mg, 40 mg, Oral, Daily, Jennye Boroughs, MD, 40 mg at 12/24/22 0813   senna-docusate (Senokot-S) tablet 1 tablet, 1 tablet, Oral, QHS PRN, Jennye Boroughs, MD   umeclidinium-vilanterol [facility] ELLIPTA) 62.5-25 MCG/ACT 1 puff, 1 puff, Inhalation, Daily, Jennye Boroughs, MD, 1 puff at 12/24/22 0819 Labs CBC    Component Value Date/Time   WBC 5.3 12/23/2022 0844   RBC 4.85 12/23/2022 0844   HGB 14.7 12/23/2022 0844   HGB 15.5 10/11/2014 1354   HCT 45.6 12/23/2022 0844   HCT 48.1 (H) 10/11/2014 1354   PLT 317 12/23/2022 0844   PLT 324 10/11/2014 1354   MCV 94.0 12/23/2022 0844   MCV 92 10/11/2014 1354   MCH 30.3 12/23/2022 0844   MCHC 32.2 12/23/2022 0844   RDW 13.5 12/23/2022 0844   RDW 14.4 10/11/2014 1354   LYMPHSABS 2.0 12/23/2022 0844   LYMPHSABS 2.9 10/11/2014 1354   MONOABS 0.3 12/23/2022 0844   MONOABS 0.5 10/11/2014 1354   EOSABS 0.0 12/23/2022 0844   EOSABS 0.0 10/11/2014 1354   BASOSABS 0.0 12/23/2022 0844   BASOSABS 0.1 10/11/2014 1354    CMP     Component Value Date/Time   NA 136 12/23/2022 0844   NA 135 (L) 10/11/2014 1354   K 4.5 12/23/2022 0844   K 3.9 10/11/2014 1354   CL 101 12/23/2022 0844   CL 98  10/11/2014 1354   CO2 25 12/23/2022 0844   CO2 32 10/11/2014 1354   GLUCOSE 125 (H) 12/23/2022 0844   GLUCOSE 88 10/11/2014 1354   BUN 11 12/23/2022 0844   BUN 8 10/11/2014 1354   CREATININE 0.62 12/23/2022 0844   CREATININE 0.84 10/11/2014 1354   CALCIUM 9.8 12/23/2022 0844   CALCIUM 9.6 10/11/2014 1354   PROT 7.1 11/25/2022 0947   PROT 8.6 (H) 07/13/2013 2330   ALBUMIN 4.4 11/25/2022 0947   ALBUMIN 4.3 07/13/2013 2330   AST 19 11/25/2022 0947   AST 17 07/13/2013 2330   ALT 12 11/25/2022 0947   ALT 13 07/13/2013 2330   ALKPHOS 71 11/25/2022 0947   ALKPHOS 109 07/13/2013 2330   BILITOT 0.9 11/25/2022 0947   BILITOT 0.3 07/13/2013 2330   GFRNONAA >60 12/23/2022 0844   GFRNONAA >60  10/11/2014 1354   GFRNONAA >60 07/13/2013 2330   GFRAA >60 11/04/2018 1230   GFRAA >60 10/11/2014 1354   GFRAA >60 07/13/2013 2330     Lipid Panel     Component Value Date/Time   CHOL 155 11/25/2022 0947   TRIG 151.0 (H) 11/25/2022 0947   HDL 66.10 11/25/2022 0947   CHOLHDL 2 11/25/2022 0947   VLDL 30.2 11/25/2022 0947   LDLCALC 58 11/25/2022 0947   Hemoglobin A1c-11/25/2022-6.2  2D echo-PENDING  Imaging I have reviewed images in epic and the results pertinent to this consultation are: CT head reviewed yesterday. 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 Postsurgical changes from prior left supraclinoid ICA aneurysm clipping. No evidence of recurrent aneurysm in that region. No acute abnormality. No hemodynamically significant stenosis in the neck.   Assessment: 62 year old with past medical history of brain aneurysm status post left craniotomy for clipping for ruptured aneurysm in the left MCA in 1999 treated at [facility], hypertension, hyperlipidemia, hypothyroidism, rheumatoid arthritis presenting for 1 day worth of left-sided facial tingling and numbness.  There is no palpable tenderness.  No complaints of vision issues.  Unable to get MRIs due to aneurysm clips.  Symptoms have been resolving. Atypical facial paresthesias versus small thalamic stroke versus thalamic TIA being worked up as differentials. Has completed the stroke/TIA risk factor workup.  Recommendations: Aspirin 81 Repeat head CT pending-will review and update recommendations if there is any abnormality Follow Echo No need for high intensity statin-LDL at goal without statin Follow PT OT recommendations Follow-up with outpatient neurology- [facility]y in 8 to 12 weeks.  Patient would like to keep her care in the [facility] system to ensure continued of care.  Plan was relayed to Dr.  Mal Misty   -- Amie Portland, MD Neurologist [facility] Pager: [phone]  [facility] with no acute changes. No new recs  -- Amie Portland, MD Neurologist [facility] Pager: [phone]

## 2022-12-24 NOTE — Progress Notes (Signed)
PT Cancellation Note  Patient Details Name: Brooke Murphy MRN: [id number] DOB: 08/20/1961   Cancelled Treatment:    Reason Eval/Treat Not Completed: PT screened, no needs identified, will sign off. Orders received and chart reviewed. Pt independent with bed mobility, transfers, gait (completes > 200' reciprocal step through pattern) and asc/desc stairs (completes 12 stairs alternating pattern). Pt at baseline level of functioning with no acute PT needs. Reports performing her toileting independently as well. PT to sign off.    Salem Caster. Fairly IV, PT, DPT Physical Therapist- [facility]  12/24/2022, 9:25 AM

## 2022-12-24 NOTE — Progress Notes (Signed)
OT Cancellation Note  Patient Details Name: Brooke Murphy MRN: [id number] DOB: 02-02-1961   Cancelled Treatment:    Reason Eval/Treat Not Completed: OT screened, no needs identified, will sign off. Order received, chart reviewed. Per conversation with PT, pt back to baseline functional independence. No skilled OT needs identified. Will sign off. Please re-consult if additional needs arise.   Dessie Coma, M.S. OTR/L  12/24/22, 9:17 AM  ascom [phone]

## 2022-12-25 ENCOUNTER — Telehealth: Payer: Self-pay

## 2022-12-25 DIAGNOSIS — I671 Cerebral aneurysm, nonruptured: Secondary | ICD-10-CM | POA: Diagnosis present

## 2022-12-25 NOTE — Telephone Encounter (Signed)
Left detail message for patient that I went a head and called the endo unit. I have cancel the colonoscopy as requested.

## 2022-12-25 NOTE — Transitions of Care (Post Inpatient/ED Visit) (Signed)
   12/25/2022  Name: Brooke Murphy MRN: [id number] DOB: September 02, 1961  Today's TOC FU Call Status: Today's TOC FU Call Status:: Successful TOC FU Call Competed TOC FU Call Complete Date: 12/25/22  Transition Care Management Follow-up Telephone Call Date of Discharge: 12/24/22 Discharge Facility: [facility]) Type of Discharge: Inpatient Admission Primary Inpatient Discharge Diagnosis:: paresthesia of skin How have you been since you were released from the hospital?: Better Any questions or concerns?: No  Items Reviewed: Did you receive and understand the discharge instructions provided?: Yes Medications obtained and verified?: Yes (Medications Reviewed) Any new allergies since your discharge?: No Dietary orders reviewed?: Yes Do you have support at home?: Yes People in Home: grandchild(ren)  Home Care and Equipment/Supplies: [place] Ordered?: NA Any new equipment or medical supplies ordered?: NA  Functional Questionnaire: Do you need assistance with bathing/showering or dressing?: No Do you need assistance with meal preparation?: No Do you need assistance with eating?: No Do you need assistance with getting out of bed/getting out of a chair/moving?: No Do you have difficulty managing or taking your medications?: No  Follow up appointments reviewed: PCP Follow-up appointment confirmed?: [facility] Follow-up appointment confirmed?: Yes Date of Specialist follow-up appointment?: 01/01/23 Follow-Up Specialty Provider:: Dr Avis Epley Do you need transportation to your follow-up appointment?: No Do you understand care options if your condition(s) worsen?: Yes-patient verbalized understanding    [place], [place] Nurse Health Advisor Direct Dial [phone]

## 2022-12-25 NOTE — Telephone Encounter (Signed)
Pt would like to cancel procedure on 12/30/2022 was released from hospital on 12/24/2022  will call back and reschedule

## 2022-12-30 ENCOUNTER — Encounter: Admission: RE | Payer: Self-pay | Source: Home / Self Care

## 2022-12-30 ENCOUNTER — Ambulatory Visit: Admission: RE | Admit: 2022-12-30 | Payer: 59 | Source: Home / Self Care | Admitting: Gastroenterology

## 2022-12-30 SURGERY — COLONOSCOPY WITH PROPOFOL
Anesthesia: General

## 2023-01-01 DIAGNOSIS — I671 Cerebral aneurysm, nonruptured: Secondary | ICD-10-CM | POA: Diagnosis not present

## 2023-01-02 ENCOUNTER — Encounter: Payer: Self-pay | Admitting: Pulmonary Disease

## 2023-01-02 ENCOUNTER — Ambulatory Visit (INDEPENDENT_AMBULATORY_CARE_PROVIDER_SITE_OTHER): Payer: 59 | Admitting: Pulmonary Disease

## 2023-01-02 VITALS — BP 148/86 | HR 73 | Temp 97.1°F | Ht 65.0 in | Wt 132.2 lb

## 2023-01-02 DIAGNOSIS — J449 Chronic obstructive pulmonary disease, unspecified: Secondary | ICD-10-CM | POA: Diagnosis not present

## 2023-01-02 DIAGNOSIS — R911 Solitary pulmonary nodule: Secondary | ICD-10-CM

## 2023-01-02 DIAGNOSIS — Z87891 Personal history of nicotine dependence: Secondary | ICD-10-CM

## 2023-01-02 MED ORDER — TRELEGY ELLIPTA 100-62.5-25 MCG/ACT IN AEPB: 1.0000 | INHALATION_SPRAY | Freq: Every day | RESPIRATORY_TRACT | 0 refills | Status: DC

## 2023-01-02 MED ORDER — ALBUTEROL SULFATE HFA 108 (90 BASE) MCG/ACT IN AERS: 2.0000 | INHALATION_SPRAY | Freq: Four times a day (QID) | RESPIRATORY_TRACT | 2 refills | Status: DC | PRN

## 2023-01-02 NOTE — Patient Instructions (Addendum)
We are giving you a trial of an inhaler called Trelegy Ellipta.  This is the "big brother" to Anoro.  This will replace the Anoro.  It is 1 puff daily, make sure you rinse your mouth well after you use it.  Let us know how you do with this inhaler so we can call in the prescription for you.  We are sending in a prescription for your rescue inhaler.  We will schedule breathing tests when you come back to see [place].  I want to make sure you do okay from your coiling of your aneurysm before we order these tests.  We will see you in follow-up in 3 months time, call sooner should any new problems arise.

## 2023-01-02 NOTE — Progress Notes (Signed)
Subjective:    Patient ID: Brooke Murphy, female    DOB: [birth year], 62 y.o.   MRN: [id number] Patient Care Team: Allegra Grana, FNP as PCP - General (Family Medicine) Benita Gutter, RN as Registered Nurse Salena Saner, MD as Consulting Physician (Pulmonary Disease)  Chief Complaint  Patient presents with   Follow-up    No SOB. Wheezing at night. Cough with yellow sputum.    HPI The patient is a 62 year old former smoker (quit July 2023, 37 PY) who presents for follow-up on COPD on the basis of clinical grounds. She also is being followed for a lung nodule seen on CT.  We last saw her on 04 April 2022 at that time we had given her a trial of Anoro Ellipta and had reordered pulmonary function testing which she had missed previously.  For evaluation of her pulmonary nodule she had a PET/CT performed on 21 March 2021 that did not show FDG avidity on the nodule in question.  She had a low-dose CT chest for lung cancer screening purposes performed on 29 March 2022 and this showed that the nodule of interest on the prior exam had decreased in volume and regressed to 6 mm in diameter.  Other upper lobe nodules were unchanged.  The patient was then enrolled in lung cancer screening program to continue follow-up.  CT of the chest also showed significant emphysema.  To date she has not had the PFTs previously ordered.  The patient did quit smoking after her last visit here in July 2023.  She has not relapsed.  She notes that the Anoro does help her with shortness of breath however she does note that towards the end of the evening she developed some bronchospasm and notes some wheezing.  She has chronic cough mostly in the mornings productive of yellowish sputum.  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  Recently she had a CT angio head and neck due to a transient ischemic attack with left-sided numbness.  She has had prior history of aneurysm rupture.  She was noted to have another  aneurysm and will need coiling for this.  I discussed with the patient that because of this we best to hold off on PFTs on till this issue is addressed.  Review of Systems A 10 point review of systems was performed and it is as noted above otherwise negative.  Patient Active Problem List   Diagnosis Date Noted   Cerebral aneurysm without rupture 12/25/2022   Elevated vitamin B12 level 12/24/2022   Left facial numbness 12/23/2022   Cervical high risk HPV (human papillomavirus) test positive 12/06/2022   Skin rash 11/07/2020   Cyst of right ovary 10/28/2018   Suprapubic pressure 10/09/2018   Prediabetes 10/05/2018   Adnexal mass 10/05/2018   Constipation 10/05/2018   Hypokalemia 09/23/2018   COPD with acute exacerbation 09/23/2018   Aortic atherosclerosis 09/09/2018   Smoker 07/20/2018   Goiter 07/20/2018   Right arm pain 07/20/2018   Encounter for screening for malignant neoplasm of respiratory organs 07/20/2018   Headache 11/17/2017   Sinus pressure 11/17/2017   Hyperthyroidism 04/29/2017   Routine physical examination 02/12/2017   GERD (gastroesophageal reflux disease) 08/27/2016   HLD (hyperlipidemia) 08/27/2016   Depression 08/27/2016   Right shoulder pain 06/15/2015   Arthritis 04/08/2015   HTN (hypertension) 12/10/2014   Allergic rhinitis 12/10/2014   Thoracic back pain 12/10/2014   Social History   Tobacco Use   Smoking status: Former  Packs/day: 1.00    Years: 37.00    Additional pack years: 0.00    Total pack years: 37.00    Types: Cigarettes    Quit date: 03/2022    Years since quitting: 0.7   Smokeless tobacco: Never  Substance Use Topics   Alcohol use: No    Alcohol/week: 0.0 standard drinks of alcohol   Allergies  Allergen Reactions   Celebrex [Celecoxib] Rash    Rash. Upset stomach   Lodine [Etodolac] Rash    Rash. Upset stomach   Penicillins Rash    Has patient had a PCN reaction causing immediate rash, facial/tongue/throat swelling, SOB or  lightheadedness with hypotension: Yes Has patient had a PCN reaction causing severe rash involving mucus membranes or skin necrosis: No Has patient had a PCN reaction that required hospitalization: No Has patient had a PCN reaction occurring within the last 10 years: No If all of the above answers are "NO", then may proceed with Cephalosporin use.   Current Meds  Medication Sig   albuterol (VENTOLIN HFA) 108 (90 Base) MCG/ACT inhaler INHALE 2 PUFFS INTO THE LUNGS EVERY 6 (SIX) HOURS AS NEEDED FOR WHEEZING OR SHORTNESS OF BREATH (OR COUGH).   amLODipine (NORVASC) 5 MG tablet Take 1 tablet (5 mg total) by mouth daily.   aspirin EC 81 MG tablet Take 1 tablet (81 mg total) by mouth daily. Swallow whole.   atorvastatin (LIPITOR) 20 MG tablet Take 1 tablet (20 mg total) by mouth daily.   cyclobenzaprine (FLEXERIL) 5 MG tablet TAKE 1 TABLET BY MOUTH AT BEDTIME AS NEEDED FOR MUSCLE SPASMS.   fluticasone (FLONASE) 50 MCG/ACT nasal spray Place 2 sprays into both nostrils daily. (Patient taking differently: Place 2 sprays into both nostrils daily as needed for allergies.)   Fluticasone-Umeclidin-Vilant (TRELEGY ELLIPTA) 100-62.5-25 MCG/ACT AEPB Inhale 1 puff into the lungs daily.   gabapentin (NEURONTIN) 100 MG capsule TAKE 1 CAPSULE (100 MG TOTAL) BY MOUTH 3 (THREE) TIMES DAILY AS NEEDED (FOR HEADACHE/NERVE PAIN).   losartan (COZAAR) 100 MG tablet Take 1 tablet (100 mg total) by mouth daily.   metoprolol tartrate (LOPRESSOR) 25 MG tablet Take 1 tablet (25 mg total) by mouth 2 (two) times daily.   omeprazole (PRILOSEC) 20 MG capsule TAKE 1 CAPSULE BY MOUTH EVERY DAY   [DISCONTINUED] umeclidinium-vilanterol (ANORO ELLIPTA) 62.5-25 MCG/ACT AEPB Inhale 1 puff into the lungs daily.   Immunization History  Administered Date(s) Administered   PFIZER(Purple Top)SARS-COV-2 Vaccination 05/24/2020, 06/15/2020       Objective:   Physical Exam BP (!) 148/86 (BP Location: Left Arm, Cuff Size: Normal)   Pulse  73   Temp (!) 97.1 F (36.2 C)   Ht  (1.651 m)   Wt 132 lb 3.2 oz (60 kg)   SpO2 98%   BMI 22.00 kg/m   SpO2: 98 % O2 Device: None (Room air)  GENERAL: Well-developed well-nourished woman, no acute distress.  Fully ambulatory.  No conversational dyspnea.  Occasional clearing of the throat.  Occasional dry cough noted. HEAD: Normocephalic, atraumatic.  EYES: Pupils equal, round, reactive to light.  No scleral icterus.  MOUTH: Teeth show areas of chipping.  Oral mucosa moist.  No thrush. NECK: Supple. No thyromegaly. Trachea midline. No JVD.  No adenopathy. PULMONARY: Good air entry bilaterally.  No adventitious sounds. CARDIOVASCULAR: S1 and S2. Regular rate and rhythm.  No rubs, murmurs or gallops heard. ABDOMEN: Benign. MUSCULOSKELETAL: No joint deformity, no clubbing, no edema.  NEUROLOGIC: No focal deficit, no gait disturbance,  speech is fluent. SKIN: Intact,warm,dry. PSYCH: Mood and behavior normal.      Assessment & Plan:     ICD-10-CM   1. COPD suggested by initial evaluation  J44.9    Noted some bronchospasm in the evening Change Anoro to Trelegy Ellipta 100 Provided to the patient PFTs once recovered from aneurysm coiling    2. Lung nodule  R91.1    Regressing by last measure Continue lung cancer screening    3. Former cigarette smoker  Z87.891    No evidence of relapse Patient commended on discontinuation of smoking     Meds ordered this encounter  Medications   Fluticasone-Umeclidin-Vilant (TRELEGY ELLIPTA) 100-62.5-25 MCG/ACT AEPB    Sig: Inhale 1 puff into the lungs daily.    Dispense:  28 each    Refill:  0    Order Specific Question:   Lot Number?    Answer:   [id number]    Order Specific Question:   Expiration Date?    Answer:   04/23/2024    Order Specific Question:   Quantity    Answer:   2   albuterol (VENTOLIN HFA) 108 (90 Base) MCG/ACT inhaler    Sig: Inhale 2 puffs into the lungs every 6 (six) hours as needed for wheezing or shortness of  breath (or cough).    Dispense:  18 each    Refill:  2   Will see the patient in follow-up in 3 months time she is to contact us prior to that time should any new difficulties arise.  Gailen Shelter. Laura Laney Bagshaw, MD Advanced Bronchoscopy PCCM [facility]    *This note was dictated using voice recognition software/Dragon.  Despite best efforts to proofread, errors can occur which can change the meaning. Any transcriptional errors that result from this process are unintentional and may not be fully corrected at the time of dictation.

## 2023-01-06 ENCOUNTER — Telehealth: Payer: Self-pay | Admitting: Pulmonary Disease

## 2023-01-06 ENCOUNTER — Other Ambulatory Visit (HOSPITAL_COMMUNITY): Payer: Self-pay

## 2023-01-06 ENCOUNTER — Encounter: Payer: Self-pay | Admitting: Pulmonary Disease

## 2023-01-06 MED ORDER — ANORO ELLIPTA 62.5-25 MCG/ACT IN AEPB: 1.0000 | INHALATION_SPRAY | Freq: Every day | RESPIRATORY_TRACT | 5 refills | Status: DC

## 2023-01-06 NOTE — Telephone Encounter (Signed)
PT saw Dr. Reece Agar recently and she RX's Trelgy. PT states it is raising her BP too high.   Pls call to advise possible alternative. TY  Her # is [phone]

## 2023-01-06 NOTE — Telephone Encounter (Signed)
Do you want her to take the Anoro or the Spiriva? In the MyChart message from today you wanted her to go back on the Anoro.

## 2023-01-06 NOTE — Telephone Encounter (Signed)
Can we run a ticket on patients insurance to see what else is preferred?   Route message to [facility] Triage

## 2023-01-06 NOTE — Telephone Encounter (Signed)
Preferred alternatives per test claims to complete triple therapy are as follows:   ICS/LABA Wixela-$40.00 Symbicort-$40.00 Breo-$125.39  LAMA Spiriva Respimat-$203.06 Spiriva Handihaler-$40.00  *patient does seems to have a remaining deductible which is contributing to co-pays

## 2023-01-06 NOTE — Telephone Encounter (Signed)
I notified the patient. She needed a refill of the Anoro sent to [facility] in [place].  I have sent in the refill and the patient is aware.  Nothing further needed.

## 2023-01-06 NOTE — Telephone Encounter (Signed)
Her BP was elevated when she came to see [place]. I am not sure that this is the Trelegy. But if she fells better, lets go back to the Anoro, 1 puff daily.

## 2023-01-06 NOTE — Telephone Encounter (Signed)
Her BP was elevated before she tried Trelegy. I am concerned LABA?ICS will increase BP further. Lets try Spiriva Handihaler one cap inhaled daily to see how she does.

## 2023-01-06 NOTE — Telephone Encounter (Signed)
If the Anoro is covered she can go back to the Anoro.  This second message confuses me as it appeared that she wanted something cheaper.

## 2023-01-06 NOTE — Telephone Encounter (Signed)
Preferred alternatives per test claims to complete triple therapy are as follows:   ICS/LABA Wixela-$40.00 Symbicort-$40.00 Breo-$125.39  LAMA Spiriva Respimat-$203.06 Spiriva Handihaler-$40.00  *patient does seems to have a remaining deductible which is contributing to co-pays 

## 2023-01-08 ENCOUNTER — Encounter: Payer: Self-pay | Admitting: Pulmonary Disease

## 2023-01-08 MED ORDER — TIOTROPIUM BROMIDE MONOHYDRATE 18 MCG IN CAPS: 18.0000 ug | ORAL_CAPSULE | Freq: Every day | RESPIRATORY_TRACT | 11 refills | Status: DC

## 2023-01-08 NOTE — Telephone Encounter (Signed)
Per her last MyChart message, the Trelegy caused her BP to run high and the Anoro was to expensive. She was asked to contact her insurance about a cheaper alternative to the Anoro.

## 2023-01-08 NOTE — Telephone Encounter (Signed)
I am afraid that the Symbicort will also elevate her blood pressure.  I think on a prior message Spiriva was covered for her particularly on the HandiHaler form which would cause her I think approximately $40 per month.  We can send prescription for Spiriva HandiHaler 1 capsule inhaled daily.  See how she does with that.  I suspect she is going to have to be on blood pressure medication.

## 2023-01-16 DIAGNOSIS — I671 Cerebral aneurysm, nonruptured: Secondary | ICD-10-CM | POA: Diagnosis not present

## 2023-01-21 DIAGNOSIS — E039 Hypothyroidism, unspecified: Secondary | ICD-10-CM | POA: Diagnosis not present

## 2023-01-21 DIAGNOSIS — I671 Cerebral aneurysm, nonruptured: Secondary | ICD-10-CM | POA: Diagnosis not present

## 2023-01-21 DIAGNOSIS — R930 Abnormal findings on diagnostic imaging of skull and head, not elsewhere classified: Secondary | ICD-10-CM | POA: Diagnosis not present

## 2023-01-21 DIAGNOSIS — E785 Hyperlipidemia, unspecified: Secondary | ICD-10-CM | POA: Diagnosis not present

## 2023-01-21 DIAGNOSIS — J302 Other seasonal allergic rhinitis: Secondary | ICD-10-CM | POA: Diagnosis not present

## 2023-01-21 DIAGNOSIS — K5900 Constipation, unspecified: Secondary | ICD-10-CM | POA: Diagnosis not present

## 2023-01-21 DIAGNOSIS — Z888 Allergy status to other drugs, medicaments and biological substances status: Secondary | ICD-10-CM | POA: Diagnosis not present

## 2023-01-21 DIAGNOSIS — J449 Chronic obstructive pulmonary disease, unspecified: Secondary | ICD-10-CM | POA: Diagnosis not present

## 2023-01-21 DIAGNOSIS — Z79899 Other long term (current) drug therapy: Secondary | ICD-10-CM | POA: Diagnosis not present

## 2023-01-21 DIAGNOSIS — Z7902 Long term (current) use of antithrombotics/antiplatelets: Secondary | ICD-10-CM | POA: Diagnosis not present

## 2023-01-21 DIAGNOSIS — M069 Rheumatoid arthritis, unspecified: Secondary | ICD-10-CM | POA: Diagnosis not present

## 2023-01-21 DIAGNOSIS — R29810 Facial weakness: Secondary | ICD-10-CM | POA: Diagnosis not present

## 2023-01-21 DIAGNOSIS — Z7982 Long term (current) use of aspirin: Secondary | ICD-10-CM | POA: Diagnosis not present

## 2023-01-21 DIAGNOSIS — I10 Essential (primary) hypertension: Secondary | ICD-10-CM | POA: Diagnosis not present

## 2023-01-21 DIAGNOSIS — Z880 Allergy status to penicillin: Secondary | ICD-10-CM | POA: Diagnosis not present

## 2023-01-21 DIAGNOSIS — K219 Gastro-esophageal reflux disease without esophagitis: Secondary | ICD-10-CM | POA: Diagnosis not present

## 2023-01-21 DIAGNOSIS — Z87891 Personal history of nicotine dependence: Secondary | ICD-10-CM | POA: Diagnosis not present

## 2023-01-22 DIAGNOSIS — K5900 Constipation, unspecified: Secondary | ICD-10-CM | POA: Diagnosis not present

## 2023-01-22 DIAGNOSIS — K219 Gastro-esophageal reflux disease without esophagitis: Secondary | ICD-10-CM | POA: Diagnosis not present

## 2023-01-22 DIAGNOSIS — Z7902 Long term (current) use of antithrombotics/antiplatelets: Secondary | ICD-10-CM | POA: Diagnosis not present

## 2023-01-22 DIAGNOSIS — I10 Essential (primary) hypertension: Secondary | ICD-10-CM | POA: Diagnosis not present

## 2023-01-22 DIAGNOSIS — Z880 Allergy status to penicillin: Secondary | ICD-10-CM | POA: Diagnosis not present

## 2023-01-22 DIAGNOSIS — Z888 Allergy status to other drugs, medicaments and biological substances status: Secondary | ICD-10-CM | POA: Diagnosis not present

## 2023-01-22 DIAGNOSIS — I671 Cerebral aneurysm, nonruptured: Secondary | ICD-10-CM | POA: Diagnosis not present

## 2023-01-22 DIAGNOSIS — J302 Other seasonal allergic rhinitis: Secondary | ICD-10-CM | POA: Diagnosis not present

## 2023-01-22 DIAGNOSIS — J449 Chronic obstructive pulmonary disease, unspecified: Secondary | ICD-10-CM | POA: Diagnosis not present

## 2023-01-22 DIAGNOSIS — Z79899 Other long term (current) drug therapy: Secondary | ICD-10-CM | POA: Diagnosis not present

## 2023-01-22 DIAGNOSIS — M069 Rheumatoid arthritis, unspecified: Secondary | ICD-10-CM | POA: Diagnosis not present

## 2023-01-22 DIAGNOSIS — Z7982 Long term (current) use of aspirin: Secondary | ICD-10-CM | POA: Diagnosis not present

## 2023-01-22 DIAGNOSIS — E039 Hypothyroidism, unspecified: Secondary | ICD-10-CM | POA: Diagnosis not present

## 2023-01-22 DIAGNOSIS — E785 Hyperlipidemia, unspecified: Secondary | ICD-10-CM | POA: Diagnosis not present

## 2023-01-22 DIAGNOSIS — R29810 Facial weakness: Secondary | ICD-10-CM | POA: Diagnosis not present

## 2023-01-22 DIAGNOSIS — Z87891 Personal history of nicotine dependence: Secondary | ICD-10-CM | POA: Diagnosis not present

## 2023-01-23 ENCOUNTER — Telehealth: Payer: Self-pay

## 2023-01-23 NOTE — Transitions of Care (Post Inpatient/ED Visit) (Signed)
01/23/2023  Name: Brooke Murphy MRN: [id number] DOB: March 30, 1961  Today's TOC FU Call Status: Today's TOC FU Call Status:: Successful TOC FU Call Competed TOC FU Call Complete Date: 01/22/23  Transition Care Management Follow-up Telephone Call Discharge Facility: Other ([facility]) Name of Other ([facility]) Discharge Facility: [facility] Type of Discharge: Inpatient Admission Primary Inpatient Discharge Diagnosis:: cerebral aneurysm How have you been since you were released from the hospital?: Better Any questions or concerns?: No  Items Reviewed: Did you receive and understand the discharge instructions provided?: Yes Medications obtained,verified, and reconciled?: Yes (Medications Reviewed) Any new allergies since your discharge?: No Dietary orders reviewed?: Yes Do you have support at home?: Yes People in Home: child(ren), adult  Medications Reviewed Today: Medications Reviewed Today     Reviewed by Karena Addison, LPN (Licensed Practical Nurse) on 01/23/23 at [phone]  Med List Status: <None>   Medication Order Taking? Sig Documenting Provider Last Dose Status Informant  albuterol (VENTOLIN HFA) 108 (90 Base) MCG/ACT inhaler [id number] Yes Inhale 2 puffs into the lungs every 6 (six) hours as needed for wheezing or shortness of breath (or cough). Salena Saner, MD Taking Active   amLODipine (NORVASC) 5 MG tablet [id number] Yes Take 1 tablet (5 mg total) by mouth daily. Allegra Grana, FNP Taking Active Self  aspirin EC 81 MG tablet [id number] Yes Take 1 tablet (81 mg total) by mouth daily. Swallow whole. Debbe Odea, MD Taking Active Self  atorvastatin (LIPITOR) 20 MG tablet [id number] Yes Take 1 tablet (20 mg total) by mouth daily. Allegra Grana, FNP Taking Active Self  cyclobenzaprine (FLEXERIL) 5 MG tablet [id number] Yes TAKE 1 TABLET BY MOUTH AT BEDTIME AS NEEDED FOR MUSCLE SPASMS. Allegra Grana, FNP Taking Active Self  fluticasone (FLONASE) 50 MCG/ACT  nasal spray [id number] Yes Place 2 sprays into both nostrils daily.  Patient taking differently: Place 2 sprays into both nostrils daily as needed for allergies.   Rey, Richarda Overlie, NP Taking Active Self  gabapentin (NEURONTIN) 100 MG capsule [id number] Yes TAKE 1 CAPSULE (100 MG TOTAL) BY MOUTH 3 (THREE) TIMES DAILY AS NEEDED (FOR HEADACHE/NERVE PAIN). Allegra Grana, FNP Taking Active Self  ipratropium-albuterol (DUONEB) 0.5-2.5 (3) MG/3ML SOLN [id number] Yes Take 3 mLs by nebulization every 4 (four) hours as needed. Faythe Ghee, PA-C Taking Active Self  losartan (COZAAR) 100 MG tablet [id number] Yes Take 1 tablet (100 mg total) by mouth daily. Allegra Grana, FNP Taking Active Self  metoprolol tartrate (LOPRESSOR) 25 MG tablet [id number] Yes Take 1 tablet (25 mg total) by mouth 2 (two) times daily. Allegra Grana, FNP Taking Active Self  omeprazole (PRILOSEC) 20 MG capsule [id number] Yes TAKE 1 CAPSULE BY MOUTH EVERY DAY Allegra Grana, FNP Taking Active Self  tiotropium (SPIRIVA HANDIHALER) 18 MCG inhalation capsule [id number] Yes Place 1 capsule (18 mcg total) into inhaler and inhale daily. Salena Saner, MD Taking Active             Home Care and Equipment/Supplies: Were Home Health Services Ordered?: NA Any new equipment or medical supplies ordered?: NA  Functional Questionnaire: Do you need assistance with bathing/showering or dressing?: No Do you need assistance with meal preparation?: No Do you need assistance with eating?: No Do you have difficulty maintaining continence: No Do you need assistance with getting out of bed/getting out of a chair/moving?: No Do you have difficulty managing or taking your medications?: No  Follow up appointments reviewed: PCP Follow-up appointment confirmed?: Yes Follow-up  Provider: Rosina Lowenstein Follow-up appointment confirmed?: No Follow-Up Specialty Provider:: [facility] Follow-Up Not  Confirmed: Patient has Specialist Provider Number and will Call for Appointment Do you need transportation to your follow-up appointment?: No Do you understand care options if your condition(s) worsen?: Yes-patient verbalized understanding    SIGNATURE Karena Addison, LPN [facility] Nurse Health Advisor Direct Dial [phone]

## 2023-01-29 DIAGNOSIS — I671 Cerebral aneurysm, nonruptured: Secondary | ICD-10-CM | POA: Diagnosis not present

## 2023-01-31 ENCOUNTER — Ambulatory Visit: Payer: 59 | Admitting: Family

## 2023-01-31 ENCOUNTER — Encounter: Payer: Self-pay | Admitting: Family

## 2023-01-31 VITALS — BP 130/80 | HR 86 | Temp 97.9°F | Ht 65.0 in | Wt 133.4 lb

## 2023-01-31 DIAGNOSIS — I10 Essential (primary) hypertension: Secondary | ICD-10-CM

## 2023-01-31 DIAGNOSIS — I671 Cerebral aneurysm, nonruptured: Secondary | ICD-10-CM | POA: Diagnosis not present

## 2023-01-31 NOTE — Patient Instructions (Addendum)
Please reach out to Dr Allegra Lai to schedule colonoscopy and upper endoscopy   Nice to see you!

## 2023-01-31 NOTE — Assessment & Plan Note (Addendum)
Initial consult with [facility] 01/01/23 for evaluation of cerebral aneurysm, Wyline Mood NP and Dr Wendie Chess embolization of MCA aneurysm 01/21/23 .  Discussed headache at follow-up 01/29/2023 with Dr Marolyn Haller.  Plan to repeat CTA in 6 months.   Reviewed neurosurgery notes. Will follow.

## 2023-01-31 NOTE — Progress Notes (Signed)
Assessment & Plan:  Cerebral aneurysm without rupture Assessment & Plan: Initial consult with [facility] 01/01/23 for evaluation of cerebral aneurysm, Wyline Mood NP and Dr Wendie Chess embolization of MCA aneurysm 01/21/23 .  Discussed headache at follow-up 01/29/2023 with Dr Marolyn Haller.  Plan to repeat CTA in 6 months.   Reviewed neurosurgery notes. Will follow.     Primary hypertension Assessment & Plan: Chronic, stable.  Continue Toprol tartrate 25 mg twice daily, losartan 100 mg daily, amlodipine 5 mg daily.       Return precautions given.   Risks, benefits, and alternatives of the medications and treatment plan prescribed today were discussed, and patient expressed understanding.   Education regarding symptom management and diagnosis given to patient on AVS either electronically or printed.  Return in about 3 months (around 05/03/2023).  Rennie Plowman, FNP  Subjective:    Patient ID: Brooke Murphy, female    DOB: 10/06/1960, 62 y.o.   MRN: [id number]  CC: Brooke Murphy is a 62 y.o. female who presents today for follow up.   HPI: Feels well today HA and 'fogginess' are improved.     Initial consult with [facility] 01/01/23 for evaluation of cerebral aneurysm, Wyline Mood, Dr Wendie Chess embolization of MCA aneurysm 01/21/23 .  Discussed headache at follow-up 01/29/2023 with Dr Marolyn Haller.  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 18 each 2   amLODipine (NORVASC) 5 MG tablet Take 1 tablet (5 mg total) by mouth daily. 90 tablet 3   aspirin EC 81 MG tablet Take 1 tablet (81 mg total) by mouth daily. Swallow whole. 90 tablet 3   atorvastatin (LIPITOR) 20 MG tablet Take 1 tablet (20 mg total) by mouth daily. 90 tablet 3   fluticasone (FLONASE) 50 MCG/ACT nasal spray Place 2 sprays into both nostrils daily. (Patient taking differently: Place 2 sprays into both nostrils daily as needed for allergies.) 16 g 6   ipratropium-albuterol (DUONEB) 0.5-2.5 (3) MG/3ML SOLN Take 3 mLs by nebulization every 4 (four) hours as needed. 360 mL 1   losartan (COZAAR) 100 MG tablet Take 1 tablet (100 mg total) by mouth daily. 90 tablet 3   metoprolol tartrate (LOPRESSOR) 25 MG tablet Take 1 tablet (25 mg total) by mouth 2 (two) times daily. 180 tablet 3   omeprazole (PRILOSEC) 20 MG capsule TAKE 1 CAPSULE BY MOUTH EVERY DAY 90 capsule 3   tiotropium (SPIRIVA HANDIHALER) 18 MCG inhalation capsule Place 1 capsule (18 mcg total) into inhaler and inhale daily. 30 capsule 11   No current facility-administered medications on file prior to visit.    Review of Systems  Constitutional:  Negative for chills and fever.  Respiratory:  Negative for cough.   Cardiovascular:  Negative for chest pain and palpitations.  Gastrointestinal:  Negative for nausea and vomiting.      Objective:  BP 130/80   Pulse 86   Temp 97.9 F (36.6 C) (Oral)   Ht 5\' 5"  (1.651 m)   Wt 133 lb 6.4 oz (60.5 kg)   SpO2 96%   BMI 22.20 kg/m  BP Readings from Last 3 Encounters:  01/31/23 130/80  01/02/23 (!) 148/86  12/24/22 137/77  01/29/23 01/29/23  Wt Readings from Last 3 Encounters:  01/31/23 133 lb 6.4 oz (60.5 kg)  01/02/23 132 lb 3.2 oz (60 kg)  12/23/22 135 lb (61.2 kg)    Physical Exam Vitals reviewed.  Constitutional:      Appearance: She is well-developed.   Eyes:     Conjunctiva/sclera: Conjunctivae normal.  Cardiovascular:     Rate and Rhythm: Normal rate and regular rhythm.     Pulses: Normal pulses.     Heart sounds: Normal heart sounds.  Pulmonary:     Effort: Pulmonary effort is normal.     Breath sounds: Normal breath sounds. No wheezing, rhonchi or rales.  Skin:    General: Skin is warm and dry.  Neurological:     Mental Status: She is alert.  Psychiatric:        Speech: Speech normal.        Behavior: Behavior normal.        Thought Content: Thought content normal.

## 2023-01-31 NOTE — Assessment & Plan Note (Signed)
Chronic, stable.  Continue Toprol tartrate 25 mg twice daily, losartan 100 mg daily, amlodipine 5 mg daily.  

## 2023-02-07 ENCOUNTER — Ambulatory Visit
Admission: RE | Admit: 2023-02-07 | Discharge: 2023-02-07 | Disposition: A | Discharge status: Home / Self Care | Payer: 59 | Source: Ambulatory Visit | Attending: Emergency Medicine | Admitting: Emergency Medicine

## 2023-02-07 VITALS — BP 150/80 | HR 78 | Temp 97.9°F | Resp 18

## 2023-02-07 DIAGNOSIS — R300 Dysuria: Secondary | ICD-10-CM | POA: Insufficient documentation

## 2023-02-07 LAB — POCT URINALYSIS DIP (MANUAL ENTRY)
Bilirubin, UA: NEGATIVE
Glucose, UA: NEGATIVE mg/dL
Nitrite, UA: NEGATIVE
Protein Ur, POC: >=300 mg/dL — AB
Spec Grav, UA: >=1.03 — AB (ref 1.010–1.025)
Urobilinogen, UA: 1 E.U./dL
pH, UA: 6 (ref 5.0–8.0)

## 2023-02-07 MED ORDER — NITROFURANTOIN MONOHYD MACRO 100 MG PO CAPS: 100.0000 mg | ORAL_CAPSULE | Freq: Two times a day (BID) | ORAL | 0 refills | Status: DC

## 2023-02-07 NOTE — ED Provider Notes (Signed)
Renaldo Fiddler    CSN: [id number] Arrival date & time: 02/07/23  1231      History   Chief Complaint Chief Complaint  Patient presents with   Urinary Frequency    UTI - Entered by patient    HPI Brooke Murphy is a 62 y.o. female.  Patient presents with dysuria, urinary frequency, bladder pressure x 2 days.  No treatment at home.  No fever, chills, abdominal pain, hematuria, vaginal discharge, pelvic pain, flank pain, or other symptoms.  Her medical history includes abdominal aortic aneurysm, cerebral aneurysm, hypertension, hyperlipidemia, prediabetes, hypothyroidism, COPD, rheumatoid arthritis.  The history is provided by the patient and medical records.    Past Medical History:  Diagnosis Date   AAA (abdominal aortic aneurysm) (HCC)    Brain aneurysm 1999   [facility]   GERD (gastroesophageal reflux disease)    Hyperlipidemia    Hypertension    Hyperthyroidism    Rheumatoid arthritis (HCC) 2002   s/p methotrexate, humira and embrel   Thyroid nodule    Ulcer of abdomen wall (HCC)    Vitamin D deficiency     Patient Active Problem List   Diagnosis Date Noted   Cerebral aneurysm without rupture 12/25/2022   Elevated vitamin B12 level 12/24/2022   Left facial numbness 12/23/2022   Cervical high risk HPV (human papillomavirus) test positive 12/06/2022   Skin rash 11/07/2020   Cyst of right ovary 10/28/2018   Suprapubic pressure 10/09/2018   Prediabetes 10/05/2018   Adnexal mass 10/05/2018   Constipation 10/05/2018   Hypokalemia 09/23/2018   COPD with acute exacerbation (HCC) 09/23/2018   Aortic atherosclerosis (HCC) 09/09/2018   Smoker 07/20/2018   Goiter 07/20/2018   Right arm pain 07/20/2018   Encounter for screening for malignant neoplasm of respiratory organs 07/20/2018   Headache 11/17/2017   Sinus pressure 11/17/2017   Hyperthyroidism 04/29/2017   Routine physical examination 02/12/2017   GERD (gastroesophageal reflux disease) 08/27/2016   HLD  (hyperlipidemia) 08/27/2016   Depression 08/27/2016   Right shoulder pain 06/15/2015   Arthritis 04/08/2015   HTN (hypertension) 12/10/2014   Allergic rhinitis 12/10/2014   Thoracic back pain 12/10/2014    Past Surgical History:  Procedure Laterality Date   ABDOMINAL HYSTERECTOMY  2005   ovaries remain; WITH CERVIX   BRAIN SURGERY Left 11/27/1997   aneurysm   coil embolization     COLONOSCOPY     LAPAROSCOPIC BILATERAL SALPINGO OOPHERECTOMY  11/11/2018   Procedure: LAPAROSCOPIC BILATERAL SALPINGO OOPHORECTOMY;  Surgeon: Christeen Douglas, MD;  Location: [facility] ORS;  Service: Gynecology;;    OB History   No obstetric history on file.      Home Medications    Prior to Admission medications   Medication Sig Start Date End Date Taking? Authorizing Provider  nitrofurantoin, macrocrystal-monohydrate, (MACROBID) 100 MG capsule Take 1 capsule (100 mg total) by mouth 2 (two) times daily. 02/07/23  Yes Mickie Bail, NP  albuterol (VENTOLIN HFA) 108 (90 Base) MCG/ACT inhaler Inhale 2 puffs into the lungs every 6 (six) hours as needed for wheezing or shortness of breath (or cough). 01/02/23   Salena Saner, MD  amLODipine (NORVASC) 5 MG tablet Take 1 tablet (5 mg total) by mouth daily. 11/25/22   Allegra Grana, FNP  aspirin EC 81 MG tablet Take 1 tablet (81 mg total) by mouth daily. Swallow whole. 05/31/22   Debbe Odea, MD  atorvastatin (LIPITOR) 20 MG tablet Take 1 tablet (20 mg total) by mouth daily. 11/25/22  Allegra Grana, FNP  fluticasone (FLONASE) 50 MCG/ACT nasal spray Place 2 sprays into both nostrils daily. Patient taking differently: Place 2 sprays into both nostrils daily as needed for allergies. 04/26/14   Rey, Raquel M, NP  ipratropium-albuterol (DUONEB) 0.5-2.5 (3) MG/3ML SOLN Take 3 mLs by nebulization every 4 (four) hours as needed. 09/20/18   Sherrie Mustache Roselyn Bering, PA-C  losartan (COZAAR) 100 MG tablet Take 1 tablet (100 mg total) by mouth daily. 11/25/22   Allegra Grana, FNP  metoprolol tartrate (LOPRESSOR) 25 MG tablet Take 1 tablet (25 mg total) by mouth 2 (two) times daily. 11/25/22   Allegra Grana, FNP  omeprazole (PRILOSEC) 20 MG capsule TAKE 1 CAPSULE BY MOUTH EVERY DAY 11/25/22   Allegra Grana, FNP  tiotropium (SPIRIVA HANDIHALER) 18 MCG inhalation capsule Place 1 capsule (18 mcg total) into inhaler and inhale daily. 01/08/23 01/08/24  Salena Saner, MD    Family History Family History  Problem Relation Age of Onset   Hypertension Mother    Aneurysm Mother 75       died   Hyperlipidemia Sister    Hypertension Sister    Cancer Father 69       throat cancer   Diabetes Maternal Aunt    Breast cancer Maternal Aunt 65   Hypertension Maternal Grandmother    Stroke Maternal Grandmother    Colon cancer Neg Hx     Social History Social History   Tobacco Use   Smoking status: Former    Packs/day: 1.00    Years: 37.00    Additional pack years: 0.00    Total pack years: 37.00    Types: Cigarettes    Quit date: 03/2022    Years since quitting: 0.8   Smokeless tobacco: Never  Vaping Use   Vaping Use: Never used  Substance Use Topics   Alcohol use: No    Alcohol/week: 0.0 standard drinks of alcohol   Drug use: No     Allergies   Celebrex [celecoxib], Lodine [etodolac], and Penicillins   Review of Systems Review of Systems  Constitutional:  Negative for chills and fever.  Gastrointestinal:  Negative for abdominal pain, constipation, diarrhea, nausea and vomiting.  Genitourinary:  Positive for dysuria and frequency. Negative for flank pain, hematuria, pelvic pain and vaginal discharge.     Physical Exam Triage Vital Signs ED Triage Vitals  Enc Vitals Group     BP      Pulse      Resp      Temp      Temp src      SpO2      Weight      Height      Head Circumference      Peak Flow      Pain Score      Pain Loc      Pain Edu?      Excl. in GC?    No data found.  Updated Vital Signs BP (!) 150/80    Pulse 78   Temp 97.9 F (36.6 C)   Resp 18   SpO2 96%   Visual Acuity Right Eye Distance:   Left Eye Distance:   Bilateral Distance:    Right Eye Near:   Left Eye Near:    Bilateral Near:     Physical Exam Vitals and nursing note reviewed.  Constitutional:      General: She is not in acute distress.    Appearance: Normal  appearance. She is well-developed. She is not ill-appearing.  HENT:     Mouth/Throat:     Mouth: Mucous membranes are moist.  Cardiovascular:     Rate and Rhythm: Normal rate and regular rhythm.     Heart sounds: Normal heart sounds.  Pulmonary:     Effort: Pulmonary effort is normal. No respiratory distress.     Breath sounds: Normal breath sounds.  Abdominal:     General: Bowel sounds are normal.     Palpations: Abdomen is soft.     Tenderness: There is no abdominal tenderness. There is no right CVA tenderness, left CVA tenderness, guarding or rebound.  Musculoskeletal:     Cervical back: Neck supple.  Skin:    General: Skin is warm and dry.  Neurological:     Mental Status: She is alert.  Psychiatric:        Mood and Affect: Mood normal.        Behavior: Behavior normal.      UC Treatments / Results  Labs (all labs ordered are listed, but only abnormal results are displayed) Labs Reviewed  POCT URINALYSIS DIP (MANUAL ENTRY) - Abnormal; Notable for the following components:      Result Value   Color, UA straw (*)    Clarity, UA cloudy (*)    Ketones, POC UA trace (5) (*)    Spec Grav, UA >=1.030 (*)    Blood, UA large (*)    Protein Ur, POC >=300 (*)    Leukocytes, UA Large (3+) (*)    All other components within normal limits  URINE CULTURE    EKG   Radiology No results found.  Procedures Procedures (including critical care time)  Medications Ordered in UC Medications - No data to display  Initial Impression / Assessment and Plan / UC Course  I have reviewed the triage vital signs and the nursing notes.  Pertinent  labs & imaging results that were available during my care of the patient were reviewed by me and considered in my medical decision making (see chart for details).    Dysuria.  Treating with Macrobid. Urine culture pending. Discussed with patient that we will call her if the urine culture shows the need to change or discontinue the antibiotic. Instructed her to follow-up with her PCP if her symptoms are not improving. Patient agrees to plan of care.     Final Clinical Impressions(s) / UC Diagnoses   Final diagnoses:  Dysuria     Discharge Instructions      Take the antibiotic as directed.  The urine culture is pending.  We will call you if it shows the need to change or discontinue your antibiotic.    Follow up with your primary care provider if your symptoms are not improving.        ED Prescriptions     Medication Sig Dispense Auth. Provider   nitrofurantoin, macrocrystal-monohydrate, (MACROBID) 100 MG capsule Take 1 capsule (100 mg total) by mouth 2 (two) times daily. 10 capsule Mickie Bail, NP      PDMP not reviewed this encounter.   Mickie Bail, NP 02/07/23 1257

## 2023-02-07 NOTE — ED Triage Notes (Signed)
Patient to Urgent Care with complaints of urinary frequency/ bladder pressure that started on Wednesday.   Denies any known fevers. No vaginal discharge.

## 2023-02-07 NOTE — Discharge Instructions (Signed)
Take the antibiotic as directed.  The urine culture is pending.  We will call you if it shows the need to change or discontinue your antibiotic.    Follow up with your primary care provider if your symptoms are not improving.    

## 2023-02-09 LAB — URINE CULTURE: Culture: 40000 — AB

## 2023-02-11 ENCOUNTER — Telehealth: Payer: Self-pay | Admitting: Family

## 2023-02-11 NOTE — Telephone Encounter (Signed)
Patient went to urgent care last Friday, the 17th of May. They gave her medication to take for 5 days. She has 2 more days remaining and she still feels pressure. Pressure was going away a litle, but now coming back. She is drinking a lot of water and eating right. She also states that the nitrofurantoin, macrocrystal-monohydrate, (MACROBID) 100 MG capsule that was prescribed is messing up her stomach and also giving her acid reflex.

## 2023-02-12 ENCOUNTER — Telehealth: Payer: Self-pay

## 2023-02-12 NOTE — Telephone Encounter (Signed)
Spoke to pt and scheduled her an appt on 02/13/23

## 2023-02-12 NOTE — Telephone Encounter (Signed)
Spoke to pt and scheduled her an appt for 02/13/23 with Mallie Snooks

## 2023-02-12 NOTE — Telephone Encounter (Signed)
Patient had sent a message to [place] regarding scheduling an appointment with Rennie Plowman, FNP, regarding an AWV.  I spoke with patient for clarification.  Patient states she received a message in her MyChart letting her know she needs to schedule an AWV.  In talking with patient we discovered that we have Medicare [employer] and [employer] listed as her insurances, but we do not have a copy of her Medicare card.  Patient states she is not on Medicare, only [employer].  I asked patient if she still needed to schedule an appointment with Rennie Plowman, FNP.  Patient states she called yesterday and is waiting to hear back from [place], so she may need an appointment related to the call.  I let her know that the message has been sent to Rennie Plowman, FNP, and we are waiting to hear back from her.  We will not schedule an appointment at this time.

## 2023-02-13 ENCOUNTER — Encounter: Payer: Self-pay | Admitting: Nurse Practitioner

## 2023-02-13 ENCOUNTER — Ambulatory Visit (INDEPENDENT_AMBULATORY_CARE_PROVIDER_SITE_OTHER): Payer: 59 | Admitting: Nurse Practitioner

## 2023-02-13 VITALS — BP 132/78 | HR 67 | Temp 97.9°F | Ht 65.0 in | Wt 134.6 lb

## 2023-02-13 DIAGNOSIS — R399 Unspecified symptoms and signs involving the genitourinary system: Secondary | ICD-10-CM | POA: Diagnosis not present

## 2023-02-13 DIAGNOSIS — R350 Frequency of micturition: Secondary | ICD-10-CM | POA: Diagnosis not present

## 2023-02-13 LAB — URINALYSIS, ROUTINE W REFLEX MICROSCOPIC
Bilirubin Urine: NEGATIVE
Hgb urine dipstick: NEGATIVE
Ketones, ur: NEGATIVE
Leukocytes,Ua: NEGATIVE
Nitrite: NEGATIVE
RBC / HPF: NONE SEEN (ref 0–?)
Specific Gravity, Urine: 1.02 (ref 1.000–1.030)
Urine Glucose: NEGATIVE
Urobilinogen, UA: 0.2 (ref 0.0–1.0)
pH: 6.5 (ref 5.0–8.0)

## 2023-02-13 NOTE — Progress Notes (Signed)
Established Patient Office Visit  Subjective:  Patient ID: Brooke Murphy, female    DOB: 21-May-1961  Age: 62 y.o. MRN: [id number]  CC:  Chief Complaint  Patient presents with   Urinary Tract Infection    HPI  Brooke Murphy presents for Stomach pain, bloating nad constipation after taking Macrobid for UTI. She took 4 day course and did not take the mediation on the last day,  She reports that the frequency and urgency is still there.  She is drinking 6-8 bottles of water. Have some abdominal pressure but no pain.   HPI   Past Medical History:  Diagnosis Date   AAA (abdominal aortic aneurysm) (HCC)    Brain aneurysm 1999   [facility]   GERD (gastroesophageal reflux disease)    Hyperlipidemia    Hypertension    Hyperthyroidism    Rheumatoid arthritis (HCC) 2002   s/p methotrexate, humira and embrel   Thyroid nodule    Ulcer of abdomen wall (HCC)    Vitamin D deficiency     Past Surgical History:  Procedure Laterality Date   ABDOMINAL HYSTERECTOMY  2005   ovaries remain; WITH CERVIX   BRAIN SURGERY Left 11/27/1997   aneurysm   coil embolization     COLONOSCOPY     LAPAROSCOPIC BILATERAL SALPINGO OOPHERECTOMY  11/11/2018   Procedure: LAPAROSCOPIC BILATERAL SALPINGO OOPHORECTOMY;  Surgeon: Christeen Douglas, MD;  Location: [facility] ORS;  Service: Gynecology;;    Family History  Problem Relation Age of Onset   Hypertension Mother    Aneurysm Mother 18       died   Hyperlipidemia Sister    Hypertension Sister    Cancer Father 62       throat cancer   Diabetes Maternal Aunt    Breast cancer Maternal Aunt 65   Hypertension Maternal Grandmother    Stroke Maternal Grandmother    Colon cancer Neg Hx     Social History   Socioeconomic History   Marital status: Single    Spouse name: Not on file   Number of children: 5   Years of education: Not on file   Highest education level: Not on file  Occupational History   Not on file  Tobacco Use   Smoking status: Former     Packs/day: 1.00    Years: 37.00    Additional pack years: 0.00    Total pack years: 37.00    Types: Cigarettes    Quit date: 03/2022    Years since quitting: 0.9   Smokeless tobacco: Never  Vaping Use   Vaping Use: Never used  Substance and Sexual Activity   Alcohol use: No    Alcohol/week: 0.0 standard drinks of alcohol   Drug use: No   Sexual activity: Not Currently  Other Topics Concern   Not on file  Social History Narrative   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; Building services engineer   Lives with Aunt      Social Determinants of Health   Financial Resource Strain: Not on file  Food Insecurity: No Food Insecurity (12/23/2022)   Hunger Vital Sign    Worried About Running Out of Food in the Last Year: Never true    Ran Out of Food in the Last Year: Never true  Transportation Needs: No Transportation Needs (12/23/2022)   PRAPARE - Administrator, Civil Service (Medical): No    Lack of Transportation (Non-Medical): No  Physical Activity: Not on file  Stress: Not on file  Social Connections:  Not on file  Intimate Partner Violence: Not At Risk (12/23/2022)   Humiliation, Afraid, Rape, and Kick questionnaire    Fear of Current or Ex-Partner: No    Emotionally Abused: No    Physically Abused: No    Sexually Abused: No     Outpatient Medications Prior to Visit  Medication Sig Dispense Refill   albuterol (VENTOLIN HFA) 108 (90 Base) MCG/ACT inhaler Inhale 2 puffs into the lungs every 6 (six) hours as needed for wheezing or shortness of breath (or cough). 18 each 2   amLODipine (NORVASC) 5 MG tablet Take 1 tablet (5 mg total) by mouth daily. 90 tablet 3   aspirin EC 81 MG tablet Take 1 tablet (81 mg total) by mouth daily. Swallow whole. 90 tablet 3   atorvastatin (LIPITOR) 20 MG tablet Take 1 tablet (20 mg total) by mouth daily. 90 tablet 3   losartan (COZAAR) 100 MG tablet Take 1 tablet (100 mg total) by mouth daily. 90 tablet 3   metoprolol tartrate (LOPRESSOR) 25 MG tablet  Take 1 tablet (25 mg total) by mouth 2 (two) times daily. 180 tablet 3   nitrofurantoin, macrocrystal-monohydrate, (MACROBID) 100 MG capsule Take 1 capsule (100 mg total) by mouth 2 (two) times daily. 10 capsule 0   omeprazole (PRILOSEC) 20 MG capsule TAKE 1 CAPSULE BY MOUTH EVERY DAY 90 capsule 3   tiotropium (SPIRIVA HANDIHALER) 18 MCG inhalation capsule Place 1 capsule (18 mcg total) into inhaler and inhale daily. 30 capsule 11   fluticasone (FLONASE) 50 MCG/ACT nasal spray Place 2 sprays into both nostrils daily. (Patient not taking: Reported on 02/13/2023) 16 g 6   ipratropium-albuterol (DUONEB) 0.5-2.5 (3) MG/3ML SOLN Take 3 mLs by nebulization every 4 (four) hours as needed. (Patient not taking: Reported on 02/13/2023) 360 mL 1   No facility-administered medications prior to visit.    Allergies  Allergen Reactions   Celebrex [Celecoxib] Rash    Rash. Upset stomach   Lodine [Etodolac] Rash    Rash. Upset stomach   Penicillins Rash    Has patient had a PCN reaction causing immediate rash, facial/tongue/throat swelling, SOB or lightheadedness with hypotension: Yes Has patient had a PCN reaction causing severe rash involving mucus membranes or skin necrosis: No Has patient had a PCN reaction that required hospitalization: No Has patient had a PCN reaction occurring within the last 10 years: No If all of the above answers are "NO", then may proceed with Cephalosporin use.    ROS Review of Systems  Constitutional: Negative.   Respiratory: Negative.    Cardiovascular: Negative.   Genitourinary:  Positive for frequency and urgency.  Neurological: Negative.   Psychiatric/Behavioral: Negative.        Objective:    Physical Exam Constitutional:      Appearance: Normal appearance.  Cardiovascular:     Rate and Rhythm: Normal rate and regular rhythm.     Pulses: Normal pulses.     Heart sounds: Normal heart sounds. No murmur heard. Pulmonary:     Effort: Pulmonary effort is  normal.     Breath sounds: No stridor. No wheezing.  Abdominal:     Tenderness: There is no abdominal tenderness. There is no right CVA tenderness or left CVA tenderness.  Skin:    Capillary Refill: Capillary refill takes less than 2 seconds.  Neurological:     General: No focal deficit present.     Mental Status: She is alert and oriented to person, place, and time. Mental status  is at baseline.  Psychiatric:        Mood and Affect: Mood normal.        Behavior: Behavior normal.        Thought Content: Thought content normal.        Judgment: Judgment normal.     BP 132/78   Pulse 67   Temp 97.9 F (36.6 C) (Oral)   Ht 5\' 5"  (1.651 m)   Wt 134 lb 9.6 oz (61.1 kg)   SpO2 97%   BMI 22.40 kg/m  Wt Readings from Last 3 Encounters:  02/13/23 134 lb 9.6 oz (61.1 kg)  01/31/23 133 lb 6.4 oz (60.5 kg)  01/02/23 132 lb 3.2 oz (60 kg)     Health Maintenance  Topic Date Due   Colonoscopy  10/10/2016   COVID-19 Vaccine (3 - 2023-24 season) 05/24/2022   Lung Cancer Screening  03/30/2023   INFLUENZA VACCINE  04/24/2023   MAMMOGRAM  12/12/2024   PAP SMEAR-Modifier  11/24/2025   Hepatitis C Screening  Completed   HIV Screening  Completed   HPV VACCINES  Aged Out   DTaP/Tdap/Td  Discontinued   Zoster Vaccines- Shingrix  Discontinued    There are no preventive care reminders to display for this patient.  Lab Results  Component Value Date   TSH 0.40 11/25/2022   Lab Results  Component Value Date   WBC 5.3 12/23/2022   HGB 14.7 12/23/2022   HCT 45.6 12/23/2022   MCV 94.0 12/23/2022   PLT 317 12/23/2022   Lab Results  Component Value Date   NA 136 12/23/2022   K 4.5 12/23/2022   CO2 25 12/23/2022   GLUCOSE 125 (H) 12/23/2022   BUN 11 12/23/2022   CREATININE 0.62 12/23/2022   BILITOT 0.9 11/25/2022   ALKPHOS 71 11/25/2022   AST 19 11/25/2022   ALT 12 11/25/2022   PROT 7.1 11/25/2022   ALBUMIN 4.4 11/25/2022   CALCIUM 9.8 12/23/2022   ANIONGAP 10 12/23/2022    GFR 84.78 11/25/2022   Lab Results  Component Value Date   CHOL 155 11/25/2022   Lab Results  Component Value Date   HDL 66.10 11/25/2022   Lab Results  Component Value Date   LDLCALC 58 11/25/2022   Lab Results  Component Value Date   TRIG 151.0 (H) 11/25/2022   Lab Results  Component Value Date   CHOLHDL 2 11/25/2022   Lab Results  Component Value Date   HGBA1C 6.2 11/25/2022      Assessment & Plan:  Urinary frequency Assessment & Plan: She has completed 4 days of antibiotic course. Urine culture pending. Will treat if urine culture comes positive. Advised patient to increase fluid intake.    UTI symptoms -     Urinalysis, Routine w reflex microscopic -     Urine Culture    Follow-up: No follow-ups on file.   Kara Dies, NP

## 2023-02-13 NOTE — Telephone Encounter (Signed)
Is this message concerning the ob/gyn referral? Please advise and thank you!

## 2023-02-13 NOTE — Patient Instructions (Signed)
Will do a urine microscopy and culture. Take OTC AZO for pain. Will call with the culture result and treat accordingly.

## 2023-02-14 LAB — URINE CULTURE
MICRO NUMBER:: 14995833
Result:: NO GROWTH
SPECIMEN QUALITY:: ADEQUATE

## 2023-02-14 NOTE — Telephone Encounter (Signed)
Im actually not sure how you were included on note initially.   However I dont see that she has been seen by GYN.   Is she scheduled?

## 2023-02-18 NOTE — Telephone Encounter (Signed)
noted 

## 2023-02-22 DIAGNOSIS — R350 Frequency of micturition: Secondary | ICD-10-CM | POA: Insufficient documentation

## 2023-02-22 NOTE — Assessment & Plan Note (Signed)
She has completed 4 days of antibiotic course. Urine culture pending. Will treat if urine culture comes positive. Advised patient to increase fluid intake.

## 2023-03-25 ENCOUNTER — Encounter: Payer: Self-pay | Admitting: Family

## 2023-03-25 DIAGNOSIS — N898 Other specified noninflammatory disorders of vagina: Secondary | ICD-10-CM | POA: Diagnosis not present

## 2023-03-25 DIAGNOSIS — R87618 Other abnormal cytological findings on specimens from cervix uteri: Secondary | ICD-10-CM | POA: Diagnosis not present

## 2023-03-26 NOTE — Telephone Encounter (Signed)
Pt called in regarding previous message. Pt stated she needs PA for med omeprazole (PRILOSEC) 20 MG capsule.

## 2023-03-28 ENCOUNTER — Telehealth: Payer: Self-pay

## 2023-03-28 NOTE — Telephone Encounter (Signed)
Approval letter attached to charts   

## 2023-03-28 NOTE — Telephone Encounter (Signed)
noted 

## 2023-03-28 NOTE — Telephone Encounter (Signed)
*  Primary  PA request received for Omeprazole 20MG  dr capsules  PA submitted to [employer] via CMM and is pending additional questions/determination  Key: [id number]

## 2023-03-31 ENCOUNTER — Ambulatory Visit: Payer: 59

## 2023-04-02 ENCOUNTER — Other Ambulatory Visit: Payer: Self-pay | Admitting: Family

## 2023-04-02 DIAGNOSIS — M546 Pain in thoracic spine: Secondary | ICD-10-CM

## 2023-04-24 ENCOUNTER — Encounter: Payer: Self-pay | Admitting: Pulmonary Disease

## 2023-04-24 ENCOUNTER — Ambulatory Visit (INDEPENDENT_AMBULATORY_CARE_PROVIDER_SITE_OTHER): Payer: 59 | Admitting: Pulmonary Disease

## 2023-04-24 VITALS — BP 138/80 | HR 60 | Temp 97.7°F | Ht 65.0 in | Wt 138.4 lb

## 2023-04-24 DIAGNOSIS — J449 Chronic obstructive pulmonary disease, unspecified: Secondary | ICD-10-CM

## 2023-04-24 DIAGNOSIS — R911 Solitary pulmonary nodule: Secondary | ICD-10-CM

## 2023-04-24 DIAGNOSIS — Z87891 Personal history of nicotine dependence: Secondary | ICD-10-CM | POA: Diagnosis not present

## 2023-04-24 NOTE — Patient Instructions (Signed)
We will have the lung cancer screening program reach out to you to get your screening CT scheduled.  Continue using your Spiriva as you are doing.  Will see you in follow-up in 4 to 6 months time call sooner should any new problems arise.

## 2023-04-24 NOTE — Progress Notes (Signed)
Subjective:    Patient ID: Brooke Murphy, female    DOB: [birth year], 62 y.o.   MRN: [id number]  Patient Care Team: Allegra Grana, FNP as PCP - General (Family Medicine) Benita Gutter, RN as Registered Nurse Salena Saner, MD as Consulting Physician (Pulmonary Disease)  Chief Complaint  Patient presents with   Follow-up    Better! No SOB. Occasional wheezing. Cough at night with light green sputum.     HPI Brooke Murphy is a 62 year old former smoker (quit July 2023, 37 PY) who presents for follow-up on COPD on the basis of clinical grounds. She also is being followed for a lung nodule seen on CT.  We last saw her on 02 January 2023 at that time we had given her a trial of Anoro Ellipta and had reordered pulmonary function testing which she had missed previously.  For evaluation of her pulmonary nodule she had a PET/CT performed on 21 March 2021 that did not show FDG avidity on the nodule in question.  She had a low-dose CT chest for lung cancer screening purposes performed on 29 March 2022 and this showed that the nodule of interest on the prior exam had decreased in volume and regressed to 6 mm in diameter.  She missed most recent lung cancer screening CT due to location change, she was not notified of this and went to the wrong location for her CT.  This will need to be rescheduled.  Prior CT of the chest also showed significant emphysema.  To date she has not had the PFTs previously ordered due to need for cerebral aneurysm coiling, she would like to wait a year prior to undergoing PFTs, she has a coiling procedure performed at [facility] on 21 January 2023.  She has done well after that.   The patient quit smoking in July 2023.  She has not relapsed.  Previously she was on [employer], this was switched to [employer] however the patient felt that this was driving her blood pressure too high.  Because of insurance coverage ultimately she was switched to Spiriva HandiHaler and actually has been  doing very well with this inhaler.  She notes no shortness of breath, has rare occasional wheezing that responds to as needed albuterol, very rare and occasional cough productive of yellowish sputum.  She has chronic cough mostly in the mornings productive of yellowish sputum.  She does not endorse any other symptomatology.  Overall she feels well and looks well.   Review of Systems A 10 point review of systems was performed and it is as noted above otherwise negative.   Patient Active Problem List   Diagnosis Date Noted   Urinary frequency 02/22/2023   Cerebral aneurysm without rupture 12/25/2022   Elevated vitamin B12 level 12/24/2022   Left facial numbness 12/23/2022   Cervical high risk HPV (human papillomavirus) test positive 12/06/2022   Skin rash 11/07/2020   Cyst of right ovary 10/28/2018   Suprapubic pressure 10/09/2018   Prediabetes 10/05/2018   Adnexal mass 10/05/2018   Constipation 10/05/2018   Hypokalemia 09/23/2018   COPD with acute exacerbation (HCC) 09/23/2018   Aortic atherosclerosis (HCC) 09/09/2018   Smoker 07/20/2018   Goiter 07/20/2018   Right arm pain 07/20/2018   Encounter for screening for malignant neoplasm of respiratory organs 07/20/2018   Headache 11/17/2017   Sinus pressure 11/17/2017   Hyperthyroidism 04/29/2017   Routine physical examination 02/12/2017   GERD (gastroesophageal reflux disease) 08/27/2016   HLD (hyperlipidemia) 08/27/2016  Depression 08/27/2016   Right shoulder pain 06/15/2015   Arthritis 04/08/2015   HTN (hypertension) 12/10/2014   Allergic rhinitis 12/10/2014   Thoracic back pain 12/10/2014   Past Surgical History:  Procedure Laterality Date   ABDOMINAL HYSTERECTOMY  2005   ovaries remain; WITH CERVIX   BRAIN SURGERY Left 11/27/1997   aneurysm   coil embolization     COLONOSCOPY     LAPAROSCOPIC BILATERAL SALPINGO OOPHERECTOMY  11/11/2018   Procedure: LAPAROSCOPIC BILATERAL SALPINGO OOPHORECTOMY;  Surgeon: Christeen Douglas, MD;  Location: [facility] ORS;  Service: Gynecology;;   Social History   Tobacco Use   Smoking status: Former    Current packs/day: 0.00    Average packs/day: 1 pack/day for 37.0 years (37.0 ttl pk-yrs)    Types: Cigarettes    Start date: 03/1985    Quit date: 03/2022    Years since quitting: 1.0   Smokeless tobacco: Never  Substance Use Topics   Alcohol use: No    Alcohol/week: 0.0 standard drinks of alcohol    Allergies  Allergen Reactions   Celebrex [Celecoxib] Rash    Rash. Upset stomach   Lodine [Etodolac] Rash    Rash. Upset stomach   Penicillins Rash    Has patient had a PCN reaction causing immediate rash, facial/tongue/throat swelling, SOB or lightheadedness with hypotension: Yes Has patient had a PCN reaction causing severe rash involving mucus membranes or skin necrosis: No Has patient had a PCN reaction that required hospitalization: No Has patient had a PCN reaction occurring within the last 10 years: No If all of the above answers are "NO", then may proceed with Cephalosporin use.    Current Meds  Medication Sig   albuterol (VENTOLIN HFA) 108 (90 Base) MCG/ACT inhaler Inhale 2 puffs into the lungs every 6 (six) hours as needed for wheezing or shortness of breath (or cough).   amLODipine (NORVASC) 5 MG tablet Take 1 tablet (5 mg total) by mouth daily.   aspirin EC 81 MG tablet Take 1 tablet (81 mg total) by mouth daily. Swallow whole.   atorvastatin (LIPITOR) 20 MG tablet Take 1 tablet (20 mg total) by mouth daily.   losartan (COZAAR) 100 MG tablet Take 1 tablet (100 mg total) by mouth daily.   metoprolol tartrate (LOPRESSOR) 25 MG tablet Take 1 tablet (25 mg total) by mouth 2 (two) times daily.   omeprazole (PRILOSEC) 20 MG capsule TAKE 1 CAPSULE BY MOUTH EVERY DAY   tiotropium (SPIRIVA HANDIHALER) 18 MCG inhalation capsule Place 1 capsule (18 mcg total) into inhaler and inhale daily.    Immunization History  Administered Date(s) Administered    PFIZER(Purple Top)SARS-COV-2 Vaccination 05/24/2020, 06/15/2020        Objective:   BP 138/80 (BP Location: Left Arm, Cuff Size: Normal)   Pulse 60   Temp 97.7 F (36.5 C)   Ht 5\' 5"  (1.651 m)   Wt 138 lb 6.4 oz (62.8 kg)   SpO2 100%   BMI 23.03 kg/m   SpO2: 100 % O2 Device: None (Room air)  GENERAL: Well-developed well-nourished woman, no acute distress.  Fully ambulatory.  No conversational dyspnea. HEAD: Normocephalic, atraumatic.  EYES: Pupils equal, round, reactive to light.  No scleral icterus.  MOUTH: Teeth show areas of chipping.  Oral mucosa moist.  No thrush. NECK: Supple. No thyromegaly. Trachea midline. No JVD.  No adenopathy. PULMONARY: Good air entry bilaterally.  No adventitious sounds. CARDIOVASCULAR: S1 and S2. Regular rate and rhythm.  No rubs, murmurs or gallops  heard. ABDOMEN: Benign. MUSCULOSKELETAL: No joint deformity, no clubbing, no edema.  NEUROLOGIC: No focal deficit, no gait disturbance, speech is fluent. SKIN: Intact,warm,dry. PSYCH: Mood and behavior normal.    Assessment & Plan:     ICD-10-CM   1. COPD suggested by initial evaluation (HCC)  J44.9    Continue Spiriva Continue as needed albuterol PFTs with failed due to aneurysm coiling 12/2022    2. Lung nodule  R91.1    Decreasing by last LDCT Needs to reinstate in lung cancer screening    3. Former cigarette smoker  Z87.891    No evidence of relapse     Brooke Murphy appears to be doing well.  Will continue the current course of therapy.  We will see her in follow-up in 4 to 6 months time she is to contact us prior to that time should any new problems arise.   Brooke Shelter, MD Advanced Bronchoscopy PCCM [facility]    *This note was dictated using voice recognition software/Dragon.  Despite best efforts to proofread, errors can occur which can change the meaning. Any transcriptional errors that result from this process are unintentional and may not be fully  corrected at the time of dictation.

## 2023-04-25 ENCOUNTER — Other Ambulatory Visit: Payer: Self-pay | Admitting: *Deleted

## 2023-04-25 DIAGNOSIS — Z87891 Personal history of nicotine dependence: Secondary | ICD-10-CM

## 2023-04-25 DIAGNOSIS — Z122 Encounter for screening for malignant neoplasm of respiratory organs: Secondary | ICD-10-CM

## 2023-04-25 DIAGNOSIS — F17210 Nicotine dependence, cigarettes, uncomplicated: Secondary | ICD-10-CM

## 2023-05-01 ENCOUNTER — Ambulatory Visit: Payer: 59

## 2023-05-08 DIAGNOSIS — H40153 Residual stage of open-angle glaucoma, bilateral: Secondary | ICD-10-CM | POA: Diagnosis not present

## 2023-05-09 ENCOUNTER — Inpatient Hospital Stay: Admission: RE | Admit: 2023-05-09 | Payer: 59 | Source: Ambulatory Visit

## 2023-08-01 DIAGNOSIS — I671 Cerebral aneurysm, nonruptured: Secondary | ICD-10-CM | POA: Diagnosis not present

## 2023-08-06 DIAGNOSIS — I671 Cerebral aneurysm, nonruptured: Secondary | ICD-10-CM | POA: Diagnosis not present

## 2023-08-12 ENCOUNTER — Ambulatory Visit
Admission: RE | Admit: 2023-08-12 | Discharge: 2023-08-12 | Disposition: A | Discharge status: Home / Self Care | Payer: 59 | Source: Ambulatory Visit | Attending: Acute Care | Admitting: Acute Care

## 2023-08-12 ENCOUNTER — Other Ambulatory Visit: Payer: 59

## 2023-08-12 DIAGNOSIS — F17210 Nicotine dependence, cigarettes, uncomplicated: Secondary | ICD-10-CM

## 2023-08-12 DIAGNOSIS — Z87891 Personal history of nicotine dependence: Secondary | ICD-10-CM

## 2023-08-12 DIAGNOSIS — Z122 Encounter for screening for malignant neoplasm of respiratory organs: Secondary | ICD-10-CM

## 2023-09-03 ENCOUNTER — Telehealth: Payer: Self-pay | Admitting: Acute Care

## 2023-09-03 NOTE — Telephone Encounter (Signed)
Call Report  

## 2023-09-08 DIAGNOSIS — H40153 Residual stage of open-angle glaucoma, bilateral: Secondary | ICD-10-CM | POA: Diagnosis not present

## 2023-09-09 ENCOUNTER — Ambulatory Visit (INDEPENDENT_AMBULATORY_CARE_PROVIDER_SITE_OTHER): Payer: 59 | Admitting: Pulmonary Disease

## 2023-09-09 ENCOUNTER — Encounter: Payer: Self-pay | Admitting: Pulmonary Disease

## 2023-09-09 ENCOUNTER — Other Ambulatory Visit
Admission: RE | Admit: 2023-09-09 | Discharge: 2023-09-09 | Disposition: A | Discharge status: Home / Self Care | Payer: 59 | Source: Ambulatory Visit | Attending: Pulmonary Disease | Admitting: Pulmonary Disease

## 2023-09-09 VITALS — BP 128/82 | HR 63 | Temp 97.1°F | Ht 65.0 in | Wt 135.4 lb

## 2023-09-09 DIAGNOSIS — J449 Chronic obstructive pulmonary disease, unspecified: Secondary | ICD-10-CM | POA: Diagnosis not present

## 2023-09-09 DIAGNOSIS — J439 Emphysema, unspecified: Secondary | ICD-10-CM

## 2023-09-09 DIAGNOSIS — R911 Solitary pulmonary nodule: Secondary | ICD-10-CM

## 2023-09-09 DIAGNOSIS — Z87891 Personal history of nicotine dependence: Secondary | ICD-10-CM | POA: Diagnosis not present

## 2023-09-09 NOTE — Progress Notes (Signed)
Subjective:    Patient ID: Brooke Murphy, female    DOB: Aug 10, 1961, 62 y.o.   MRN: [id number]  Patient Care Team: Allegra Grana, FNP as PCP - General (Family Medicine) Benita Gutter, RN as Registered Nurse Salena Saner, MD as Consulting Physician (Pulmonary Disease)  Chief Complaint  Patient presents with   Follow-up    No SOB or wheezing. Occasional cough.    BACKGROUND/INTERVAL:Brooke Murphy is a 62 year old former smoker (quit July 2023, 37 PY) who presents for follow-up on COPD on the basis of clinical grounds. She also is being followed for a lung nodule seen on CT.  We last saw her on 02 January 2023 at that time we had given her a trial of Anoro Ellipta and had reordered pulmonary function testing which she had missed previously.  For evaluation of her pulmonary nodule she had a PET/CT performed on 21 March 2021 that did not show FDG avidity on the nodule in question.  She had a low-dose CT chest for lung cancer screening purposes performed on 29 March 2022 and this showed that the nodule of interest on the prior exam had decreased in volume and regressed to 6 mm in diameter.  Prior CT of the chest also showed significant emphysema.  To date she has not had the PFTs previously ordered due to need for cerebral aneurysm coiling, performed at [facility] on 21 January 2023.  She has done well after that.  She has not relapsed since smoking.  No recent exacerbations.  Lung cancer screening CT performed 12 August 2023.   HPI Discussed the use of AI scribe software for clinical note transcription with the patient, who gave verbal consent to proceed.  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 The congestion was described as feeling like she 'head was stuffed under the bed,' and the wheezing was attributed to the congestion. No fever or chills were reported during this episode.  This  resolved without sequela.  The patient's chronic shortness of breath was reported as manageable, and she confirmed that Spiriva was still effective in managing her symptoms.  The patient expressed concern about her recent lung cancer screening results, which I reviewed independently. The screening revealed a cystic lung nodule in the posterior segment of the right upper lobe.  The CT in question was performed on 12 August 2023 however not interpreted by radiology until 03 September 2023 and the lung cancer screening staff not informed that the patient had a lung RADS 4B scan.  I discussed the films with the patient and showed her the findings in question.  Discussed that the next best step would be PET/CT.  The patient has not smoked for over two years.  The patient has not received a flu seen in about five years due to previous experiences of prolonged illness following the vaccine. She also has not received the pneumonia vaccine and expressed a preference to avoid it. The patient's medication regimen includes Spiriva, and she reported no issues in obtaining it.      Review of Systems A 10 point review of systems was performed and it is as noted above otherwise negative.   Patient Active Problem List   Diagnosis Date Noted   Urinary frequency 02/22/2023   Cerebral aneurysm without rupture 12/25/2022   Elevated vitamin B12 level 12/24/2022   Left facial numbness 12/23/2022   Cervical high risk HPV (  human papillomavirus) test positive 12/06/2022   Skin rash 11/07/2020   Cyst of right ovary 10/28/2018   Suprapubic pressure 10/09/2018   Prediabetes 10/05/2018   Adnexal mass 10/05/2018   Constipation 10/05/2018   Hypokalemia 09/23/2018   COPD with acute exacerbation (HCC) 09/23/2018   Aortic atherosclerosis (HCC) 09/09/2018   Smoker 07/20/2018   Goiter 07/20/2018   Right arm pain 07/20/2018   Encounter for screening for malignant neoplasm of respiratory organs 07/20/2018   Headache  11/17/2017   Sinus pressure 11/17/2017   Hyperthyroidism 04/29/2017   Routine physical examination 02/12/2017   GERD (gastroesophageal reflux disease) 08/27/2016   HLD (hyperlipidemia) 08/27/2016   Depression 08/27/2016   Right shoulder pain 06/15/2015   Arthritis 04/08/2015   HTN (hypertension) 12/10/2014   Allergic rhinitis 12/10/2014   Thoracic back pain 12/10/2014    Social History   Tobacco Use   Smoking status: Former    Current packs/day: 0.00    Average packs/day: 1 pack/day for 37.0 years (37.0 ttl pk-yrs)    Types: Cigarettes    Start date: 03/1985    Quit date: 03/2022    Years since quitting: 1.4   Smokeless tobacco: Never  Substance Use Topics   Alcohol use: No    Alcohol/week: 0.0 standard drinks of alcohol    Allergies  Allergen Reactions   Celebrex [Celecoxib] Rash    Rash. Upset stomach   Lodine [Etodolac] Rash    Rash. Upset stomach   Penicillins Rash    Has patient had a PCN reaction causing immediate rash, facial/tongue/throat swelling, SOB or lightheadedness with hypotension: Yes Has patient had a PCN reaction causing severe rash involving mucus membranes or skin necrosis: No Has patient had a PCN reaction that required hospitalization: No Has patient had a PCN reaction occurring within the last 10 years: No If all of the above answers are "NO", then may proceed with Cephalosporin use.    Current Meds  Medication Sig   albuterol (VENTOLIN HFA) 108 (90 Base) MCG/ACT inhaler Inhale 2 puffs into the lungs every 6 (six) hours as needed for wheezing or shortness of breath (or cough).   amLODipine (NORVASC) 5 MG tablet Take 1 tablet (5 mg total) by mouth daily.   aspirin EC 81 MG tablet Take 1 tablet (81 mg total) by mouth daily. Swallow whole.   atorvastatin (LIPITOR) 20 MG tablet Take 1 tablet (20 mg total) by mouth daily.   losartan (COZAAR) 100 MG tablet Take 1 tablet (100 mg total) by mouth daily.   metoprolol tartrate (LOPRESSOR) 25 MG tablet  Take 1 tablet (25 mg total) by mouth 2 (two) times daily.   omeprazole (PRILOSEC) 20 MG capsule TAKE 1 CAPSULE BY MOUTH EVERY DAY   tiotropium (SPIRIVA HANDIHALER) 18 MCG inhalation capsule Place 1 capsule (18 mcg total) into inhaler and inhale daily.    Immunization History  Administered Date(s) Administered   PFIZER(Purple Top)SARS-COV-2 Vaccination 05/24/2020, 06/15/2020      Objective:     BP 128/82 (BP Location: Right Arm, Cuff Size: Normal)   Pulse 63   Temp (!) 97.1 F (36.2 C)   Ht 5\' 5"  (1.651 m)   Wt 135 lb 6.4 oz (61.4 kg)   SpO2 99%   BMI 22.53 kg/m   SpO2: 99 % O2 Device: None (Room air)  GENERAL: Well-developed well-nourished woman, no acute distress.  Fully ambulatory.  No conversational dyspnea. HEAD: Normocephalic, atraumatic.  EYES: Pupils equal, round, reactive to light.  No scleral icterus.  MOUTH:  Teeth show areas of chipping.  Oral mucosa moist.  No thrush. NECK: Supple. No thyromegaly. Trachea midline. No JVD.  No adenopathy. PULMONARY: Good air entry bilaterally.  No adventitious sounds. CARDIOVASCULAR: S1 and S2. Regular rate and rhythm.  No rubs, murmurs or gallops heard. ABDOMEN: Benign. MUSCULOSKELETAL: No joint deformity, no clubbing, no edema.  NEUROLOGIC: No focal deficit, no gait disturbance, speech is fluent. SKIN: Intact,warm,dry. PSYCH: Mood and behavior normal.    Representative image from CT performed 12 August 2023 showing the complex cystic/part solid lesion on the right upper lobe       Assessment & Plan:     ICD-10-CM   1. COPD suggested by initial evaluation (HCC)  J44.9 Alpha-1 antitrypsin phenotype    Pulmonary Function Test [facility] Only    2. Lung nodule  R91.1 NM PET Image Restage (PS) Skull Base to Thigh (F-18 FDG)    CANCELED: NM PET Image Initial (PI) Skull Base To Thigh (F-18 FDG)    3. Former heavy cigarette smoker (20-39 per day)  Z87.891     4. Pulmonary emphysema, unspecified emphysema type (HCC)  J43.9  Alpha-1 antitrypsin phenotype    Pulmonary Function Test [facility] Only      Orders Placed This Encounter  Procedures   NM PET Image Restage (PS) Skull Base to Thigh (F-18 FDG)    Standing Status:   Future    Expiration Date:   09/08/2024    If indicated for the ordered procedure, I authorize the administration of a radiopharmaceutical per Radiology protocol:   Yes    Preferred imaging location?:   [facility]   Alpha-1 antitrypsin phenotype    Standing Status:   Future    Expiration Date:   09/08/2024   Pulmonary Function Test [facility] Only    Standing Status:   Future    Expiration Date:   09/08/2024    Full PFT: includes the following: basic spirometry, spirometry pre & post bronchodilator, diffusion capacity (DLCO), lung volumes:   Full PFT    This test can only be performed at:   [facility]   Assessment and Plan    Emphysema/COPD Significant emphysema noted on CT. Reports improvement with Spiriva. No recent fevers, chills, or significant dyspnea. No current wheezing. Quit smoking over two years ago. Declined flu and pneumonia vaccinations due to adverse reactions. Will check for alpha-1 antitrypsin deficiency to rule out genetic causes. - Continue Spiriva - Order blood test for alpha-1 antitrypsin deficiency - Order pulmonary function tests (PFTs)  Cystic Lung Nodule Atypical pulmonary cyst in the posterior segment of the right upper lobe with an enlarging nodular component. The lung cancer screening CT in November 2024 showed growth. Differential includes primary bronchogenic carcinoma, infection, or inflammation. Discussed the need for PET CT to assess activity and potential biopsy risks, including pneumothorax. - Order PET CT to assess activity of the nodule - Follow-up appointment after PET CT results  General Health Maintenance Has not received flu or pneumonia vaccinations in the past five years. Avoids vaccinations due to adverse reactions. - Discussed the  importance of flu and pneumonia vaccinations, but declined  Follow-up - Schedule follow-up appointment after PET CT and PFTs are completed.     Advised if symptoms do not improve or worsen, to please contact office for sooner follow up or seek emergency care.    I spent 40  minutes of dedicated to the care of this patient on the date of this encounter to include pre-visit review of records,  face-to-face time with the patient discussing conditions above, post visit ordering of testing, clinical documentation with the electronic health record, making appropriate referrals as documented, and communicating necessary findings to members of the patients care team.   C. Danice Goltz, MD Advanced Bronchoscopy PCCM [facility]    *This note was generated using voice recognition software/Dragon and/or AI transcription program.  Despite best efforts to proofread, errors can occur which can change the meaning. Any transcriptional errors that result from this process are unintentional and may not be fully corrected at the time of dictation.

## 2023-09-09 NOTE — Patient Instructions (Signed)
VISIT SUMMARY:  During today's visit, we discussed your recent sinus congestion and mild wheezing, which you attributed to the congestion. We also reviewed your lung cancer screening results, which showed a cystic lung nodule that has started to grow again. You have not smoked for over two years and are currently managing your COPD symptoms with Spiriva. We also talked about your concerns regarding flu and pneumonia vaccinations.  YOUR PLAN:  -EMPHYSEMA: Emphysema is a type of chronic obstructive pulmonary disease (COPD) that damages the air sacs in the lungs, making it hard to breathe. You are currently managing your symptoms with Spiriva, which you find effective. We will continue with this medication and have ordered a blood test for alpha-1 antitrypsin deficiency to rule out genetic causes. Additionally, we will conduct pulmonary function tests (PFTs) to assess your lung function.  -CYSTIC LUNG NODULE: A cystic lung nodule is an abnormal growth in the lung that can be benign or malignant. Your recent lung cancer screening showed that the nodule in your right upper lobe has started to grow again. To further assess this, we have ordered a PET CT scan to evaluate the activity of the nodule. Depending on the results, a biopsy may be necessary, although it carries some risks. We will discuss the next steps after we have the PET CT results.  -GENERAL HEALTH MAINTENANCE: We discussed the importance of flu and pneumonia vaccinations, especially given your lung condition. However, you have chosen to decline these vaccinations due to previous adverse reactions.  INSTRUCTIONS:  Please schedule a follow-up appointment after completing the PET CT and pulmonary function tests (PFTs).

## 2023-09-10 ENCOUNTER — Telehealth: Payer: Self-pay | Admitting: Acute Care

## 2023-09-10 NOTE — Telephone Encounter (Signed)
I have called the patient with the results of her Low Dose Ct Chest. Her scan was read as a 4B. Dr. Jayme Cloud reviewed the scan with the patient in the office 12/17. She has ordered a PET scan and PFT's. She has follow up with Dr. Reece Agar 1/31 in the office.  Pt understands plan of care and had no further questions. She is aware of dates of follow up imaging and future appointments.   Denise,Denise, Robynn Pane and Revonda Standard, please fax results to PCP and let them know plan id for PET scan, PFT's and follow up with Dr. Jayme Cloud in January to review results and determine plan of care moving forward.

## 2023-09-11 NOTE — Telephone Encounter (Signed)
Results and plan faxed to PCP ?

## 2023-09-13 ENCOUNTER — Telehealth: Payer: Self-pay | Admitting: Family

## 2023-09-13 ENCOUNTER — Encounter: Payer: Self-pay | Admitting: Pulmonary Disease

## 2023-09-13 NOTE — Telephone Encounter (Signed)
-----   Message from Nurse Melchor Amour sent at 09/11/2023  8:42 AM EST ----- Plan is for PET scan 09/22/2023, PFT's and follow up with Dr. Jayme Cloud in January to review results and determine plan of care moving forward.

## 2023-09-13 NOTE — Telephone Encounter (Signed)
Call patient I reviewed CT chest.  She is following closely with pulmonology and has PET scan scheduled for this month In reviewing her chart, she has not had follow-up with cardiology since 05/31/2022.  Please advise her to call [facility] heart care schedule follow-up with Dr Sandie Ano for known coronary artery disease   See phone call from Kandice Robinsons to patient 09/10/23  in regards abnormal CT chest Patient has PET scan scheduled 09/22/2023.  Follow-up with Dr. Jayme Cloud 09/2322  Echocardiogram reviewed 12/24/22 Compliant with Lipitor 20 mg daily, asa 81mg   Lab Results  Component Value Date   LDLCALC 58 11/25/2022

## 2023-09-15 NOTE — Telephone Encounter (Signed)
Spoke to pt and went over results with her per Claris Che, pt verbalized understanding:  I reviewed CT chest.  She is following closely with pulmonology and has PET scan scheduled for this month In reviewing her chart, she has not had follow-up with cardiology since 05/31/2022.  Please advise her to call [facility] heart care schedule follow-up with Dr Sandie Ano for known coronary artery disease     See phone call from Kandice Robinsons to patient 09/10/23  in regards abnormal CT chest Patient has PET scan scheduled 09/22/2023.  Follow-up with Dr. Jayme Cloud 09/2322   Echocardiogram reviewed 12/24/22 Compliant with Lipitor 20 mg daily, asa 81mg 

## 2023-09-21 LAB — ALPHA-1-ANTITRYPSIN PHENOTYP: A-1 Antitrypsin, Ser: 162 mg/dL (ref 101–187)

## 2023-09-22 ENCOUNTER — Ambulatory Visit: Payer: 59

## 2023-09-23 ENCOUNTER — Ambulatory Visit: Payer: 59

## 2023-10-01 ENCOUNTER — Ambulatory Visit
Admission: RE | Admit: 2023-10-01 | Discharge: 2023-10-01 | Disposition: A | Discharge status: Home / Self Care | Payer: 59 | Source: Ambulatory Visit | Attending: Pulmonary Disease | Admitting: Pulmonary Disease

## 2023-10-01 DIAGNOSIS — R918 Other nonspecific abnormal finding of lung field: Secondary | ICD-10-CM | POA: Diagnosis not present

## 2023-10-01 DIAGNOSIS — R911 Solitary pulmonary nodule: Secondary | ICD-10-CM | POA: Diagnosis not present

## 2023-10-01 DIAGNOSIS — J432 Centrilobular emphysema: Secondary | ICD-10-CM | POA: Insufficient documentation

## 2023-10-01 DIAGNOSIS — I517 Cardiomegaly: Secondary | ICD-10-CM | POA: Insufficient documentation

## 2023-10-01 DIAGNOSIS — E041 Nontoxic single thyroid nodule: Secondary | ICD-10-CM | POA: Insufficient documentation

## 2023-10-01 DIAGNOSIS — I700 Atherosclerosis of aorta: Secondary | ICD-10-CM | POA: Insufficient documentation

## 2023-10-01 LAB — GLUCOSE, CAPILLARY: Glucose-Capillary: 81 mg/dL (ref 70–99)

## 2023-10-01 MED ORDER — FLUDEOXYGLUCOSE F - 18 (FDG) INJECTION
7.0000 | Freq: Once | INTRAVENOUS | Status: AC | PRN
  Administered 2023-10-01: 6.96 via INTRAVENOUS

## 2023-10-16 ENCOUNTER — Ambulatory Visit (INDEPENDENT_AMBULATORY_CARE_PROVIDER_SITE_OTHER): Payer: 59 | Admitting: Pulmonary Disease

## 2023-10-16 ENCOUNTER — Encounter: Payer: Self-pay | Admitting: Pulmonary Disease

## 2023-10-16 VITALS — BP 132/82 | HR 66 | Temp 96.9°F | Ht 65.0 in | Wt 135.8 lb

## 2023-10-16 DIAGNOSIS — J449 Chronic obstructive pulmonary disease, unspecified: Secondary | ICD-10-CM | POA: Diagnosis not present

## 2023-10-16 DIAGNOSIS — E041 Nontoxic single thyroid nodule: Secondary | ICD-10-CM

## 2023-10-16 DIAGNOSIS — J439 Emphysema, unspecified: Secondary | ICD-10-CM

## 2023-10-16 DIAGNOSIS — R911 Solitary pulmonary nodule: Secondary | ICD-10-CM | POA: Diagnosis not present

## 2023-10-16 DIAGNOSIS — Z87891 Personal history of nicotine dependence: Secondary | ICD-10-CM

## 2023-10-16 NOTE — Patient Instructions (Addendum)
VISIT SUMMARY:  During today's visit, we discussed your ongoing management of COPD emphysema and reviewed the results of your recent lung cancer screening tests. We also talked about the next steps for monitoring a pulmonary nodule that has shown some changes on your CT scan.  YOUR PLAN:  -PULMONARY NODULE: A pulmonary nodule is a small growth in the lung that can be benign or malignant. Your recent CT scan showed that the nodule has increased in size, and the PET CT scan indicated some activity, which could suggest a slow-growing cancer, infection, or inflammation. Given the risks associated with a biopsy, we have decided to monitor the nodule with a follow-up CT scan in three months. We will review the results and discuss further management options at that time.  - THYROID NODULE: Will obtain ultrasound.  -EMPHYSEMA (COPD): Emphysema is a type of chronic obstructive pulmonary disease (COPD) that damages the air sacs in the lungs, making it hard to breathe. You are currently managing this condition well with Spiriva, which helps to control your symptoms. We will conduct pulmonary function tests to assess the severity of your emphysema.  Pulmonary function testing was previously ordered.  -GENERAL HEALTH MAINTENANCE: To maintain your general health, it is important to stay indoors to avoid breathing difficulties caused by cold weather. Continue taking Spiriva as prescribed to manage your COPD symptoms.  INSTRUCTIONS:  Please schedule a follow-up visit after your CT scan in three months. We will review the results and discuss the next steps for your pulmonary nodule and overall health.

## 2023-10-16 NOTE — Progress Notes (Signed)
Subjective:    Patient ID: Brooke Murphy, female    DOB: [birth year], 63 y.o.   MRN: [id number]  Patient Care Team: Allegra Grana, FNP as PCP - General (Family Medicine) Benita Gutter, RN as Registered Nurse Salena Saner, MD as Consulting Physician (Pulmonary Disease)  Chief Complaint  Patient presents with   Follow-up    No SOB, wheezing, or cough.     BACKGROUND/INTERVAL: Halley is a 63 year old former smoker (quit July 2023, 37 PY) who presents for follow-up on COPD on the basis of clinical grounds/emphysema on imaging. She also is being followed for a lung nodule seen on CT.  We last saw her on 09 September 2023 at that time she was instructed to continue Spiriva and reordered pulmonary function testing which she had missed previously.  For evaluation of her pulmonary nodule she had a PET/CT performed on 21 March 2021 that did not show FDG avidity on the nodule in question.  She had a low-dose CT chest for lung cancer screening purposes performed on 29 March 2022 and this showed that the nodule of interest on the prior exam had decreased in volume and regressed to 6 mm in diameter.  Prior CT of the chest also showed significant emphysema.  To date she has not had the PFTs previously ordered due to need for cerebral aneurysm coiling, performed at [facility] on 21 January 2023.  She has done well after that.  She has not relapsed since quitting smoking.  No recent exacerbations.  Lung cancer screening CT performed 12 August 2023 showed persistence of typical pulmonary cyst in the posterior segment of the right upper lobe with enlarging nodular component.  She underwent PET/CT on 01 October 2023 showing maximum SUV of 1.9 on this lesion which could be inflammatory or due to low-grade adenocarcinoma.  There was another area of low-level activity maximum SUV of 1.8 on the left which was attributed to chronic scarring or atelectasis.   HPI Discussed the use of AI scribe software for clinical note  transcription with the patient, who gave verbal consent to proceed.  History of Present Illness   The patient, diagnosed with COPD emphysema, has been managing her condition with Spiriva, which has been effective in controlling her cough and breathing difficulties. She has not experienced any issues in obtaining her medication.  The patient underwent a lung cancer screening test due to changes observed in her lungs on a CT scan. The scan revealed areas of emphysema, specifically bullae, and a white spot that has grown since the last scan. This spot is located near the division between the upper and lower portions of the right lung, making a biopsy a higher risk procedure due to the potential for lung collapse.  A subsequent PET CT scan was performed, which showed some activity in the area of concern, suggesting a slow-growing cancer, infection, or inflammation. The patient has previously experienced a lung collapse following an aneurysm and has residual scarring from this event.  Options for management of this nodule were provided to the patient.  They include a) ongoing observation with CT scan of the chest in 3 months, b) proceed with biopsy understanding risk of potential higher incidence of pneumothorax from the procedure c) surgical resection, PFTs would be necessary to ensure she could tolerate procedure, or d) empiric SBRT treatment.  Given the waxing and waning behavior of the nodule previously, the patient opts for observational protocol at present.  Will repeat CT chest in 3 months  time.  The patient's breathing has been stable, even in cold weather, as she tends to stay indoors. She has not required any refills on her Spiriva prescription.   Review of Systems A 10 point review of systems was performed and it is as noted above otherwise negative.   Patient Active Problem List   Diagnosis Date Noted   Urinary frequency 02/22/2023   Cerebral aneurysm without rupture 12/25/2022   Elevated  vitamin B12 level 12/24/2022   Left facial numbness 12/23/2022   Cervical high risk HPV (human papillomavirus) test positive 12/06/2022   Skin rash 11/07/2020   Cyst of right ovary 10/28/2018   Suprapubic pressure 10/09/2018   Prediabetes 10/05/2018   Adnexal mass 10/05/2018   Constipation 10/05/2018   Hypokalemia 09/23/2018   COPD with acute exacerbation (HCC) 09/23/2018   Aortic atherosclerosis (HCC) 09/09/2018   Smoker 07/20/2018   Goiter 07/20/2018   Right arm pain 07/20/2018   Encounter for screening for malignant neoplasm of respiratory organs 07/20/2018   Headache 11/17/2017   Sinus pressure 11/17/2017   Hyperthyroidism 04/29/2017   Routine physical examination 02/12/2017   GERD (gastroesophageal reflux disease) 08/27/2016   HLD (hyperlipidemia) 08/27/2016   Depression 08/27/2016   Right shoulder pain 06/15/2015   Arthritis 04/08/2015   HTN (hypertension) 12/10/2014   Allergic rhinitis 12/10/2014   Thoracic back pain 12/10/2014    Social History   Tobacco Use   Smoking status: Former    Current packs/day: 0.00    Average packs/day: 1 pack/day for 37.0 years (37.0 ttl pk-yrs)    Types: Cigarettes    Start date: 03/1985    Quit date: 03/2022    Years since quitting: 1.5   Smokeless tobacco: Never  Substance Use Topics   Alcohol use: No    Alcohol/week: 0.0 standard drinks of alcohol    Allergies  Allergen Reactions   Celebrex [Celecoxib] Rash    Rash. Upset stomach   Lodine [Etodolac] Rash    Rash. Upset stomach   Penicillins Rash    Has patient had a PCN reaction causing immediate rash, facial/tongue/throat swelling, SOB or lightheadedness with hypotension: Yes Has patient had a PCN reaction causing severe rash involving mucus membranes or skin necrosis: No Has patient had a PCN reaction that required hospitalization: No Has patient had a PCN reaction occurring within the last 10 years: No If all of the above answers are "NO", then may proceed with  Cephalosporin use.    Current Meds  Medication Sig   albuterol (VENTOLIN HFA) 108 (90 Base) MCG/ACT inhaler Inhale 2 puffs into the lungs every 6 (six) hours as needed for wheezing or shortness of breath (or cough).   amLODipine (NORVASC) 5 MG tablet Take 1 tablet (5 mg total) by mouth daily.   aspirin EC 81 MG tablet Take 1 tablet (81 mg total) by mouth daily. Swallow whole.   atorvastatin (LIPITOR) 20 MG tablet Take 1 tablet (20 mg total) by mouth daily.   losartan (COZAAR) 100 MG tablet Take 1 tablet (100 mg total) by mouth daily.   metoprolol tartrate (LOPRESSOR) 25 MG tablet Take 1 tablet (25 mg total) by mouth 2 (two) times daily.   omeprazole (PRILOSEC) 20 MG capsule TAKE 1 CAPSULE BY MOUTH EVERY DAY   tiotropium (SPIRIVA HANDIHALER) 18 MCG inhalation capsule Place 1 capsule (18 mcg total) into inhaler and inhale daily.    Immunization History  Administered Date(s) Administered   PFIZER(Purple Top)SARS-COV-2 Vaccination 05/24/2020, 06/15/2020      Objective:  BP 132/82 (BP Location: Right Arm, Cuff Size: Normal)   Pulse 66   Temp (!) 96.9 F (36.1 C)   Ht 5\' 5"  (1.651 m)   Wt 135 lb 12.8 oz (61.6 kg)   SpO2 97%   BMI 22.60 kg/m   SpO2: 97 % O2 Device: None (Room air)  GENERAL: Well-developed well-nourished woman, no acute distress.  Fully ambulatory.  No conversational dyspnea. HEAD: Normocephalic, atraumatic.  EYES: Pupils equal, round, reactive to light.  No scleral icterus.  MOUTH: Teeth show areas of chipping.  Oral mucosa moist.  No thrush. NECK: Supple. No thyromegaly. Trachea midline. No JVD.  No adenopathy. PULMONARY: Good air entry bilaterally.  No adventitious sounds. CARDIOVASCULAR: S1 and S2. Regular rate and rhythm.  No rubs, murmurs or gallops heard. ABDOMEN: Benign. MUSCULOSKELETAL: No joint deformity, no clubbing, no edema.  NEUROLOGIC: No focal deficit, no gait disturbance, speech is fluent. SKIN: Intact,warm,dry. PSYCH: Mood and behavior normal.    Representative image from CT performed 12 August 2023 showing complex cystic/part solid lesion of the right upper lobe:   Representative image from the PET/CT performed 01 October 2023 showing low-level activity on the lesion in question:    Assessment & Plan:     ICD-10-CM   1. Lung nodule  R91.1 CT CHEST WO CONTRAST    2. COPD suggested by initial evaluation (HCC)  J44.9     3. Pulmonary emphysema, unspecified emphysema type (HCC)  J43.9     4. Former heavy cigarette smoker (20-39 per day)  Z87.891       Orders Placed This Encounter  Procedures   CT CHEST WO CONTRAST    Standing Status:   Future    Expected Date:   01/14/2024    Expiration Date:   10/15/2024    Preferred imaging location?:   [facility]   Discussion:    Pulmonary Nodule Pulmonary nodule with increased size noted on CT scan. PET CT shows activity, suggesting a slow-growing malignancy, infection, or inflammation. Discussed risks of lung collapse during biopsy due to nodule's location, manageable with a small tube. Alternative options include monitoring with a follow-up CT scan in three months or treating with five sessions of pinpointed radiation without biopsy. Patient prefers follow-up CT scan due to previous lung collapse experience. - Order follow-up CT scan in three months - Review results and discuss further management options  Hypermetabolic Thyroid Nodule on the Left - Obtain thyroid ultrasound - FNA if suspicious  Emphysema (COPD) COPD with emphysema confirmed by CT. Currently managed on Spiriva, effective for breathing. Severity to be determined by pending pulmonary function tests. - Order pulmonary function tests to assess severity  General Health Maintenance Advised to stay indoors to avoid exacerbations due to cold weather. - Continue Spiriva as prescribed  Follow-up - Schedule follow-up visit after the CT scan in three months.      Advised if symptoms do not improve or worsen,  to please contact office for sooner follow up or seek emergency care.    I spent 40 minutes of dedicated to the care of this patient on the date of this encounter to include pre-visit review of records, face-to-face time with the patient discussing conditions above, post visit ordering of testing, clinical documentation with the electronic health record, making appropriate referrals as documented, and communicating necessary findings to members of the patients care team.     C. Danice Goltz, MD Advanced Bronchoscopy PCCM [facility]    *This note was generated using  voice recognition software/Dragon and/or AI transcription program.  Despite best efforts to proofread, errors can occur which can change the meaning. Any transcriptional errors that result from this process are unintentional and may not be fully corrected at the time of dictation.

## 2023-10-29 ENCOUNTER — Ambulatory Visit
Admission: RE | Admit: 2023-10-29 | Discharge: 2023-10-29 | Disposition: A | Discharge status: Home / Self Care | Payer: 59 | Source: Ambulatory Visit | Attending: Pulmonary Disease | Admitting: Pulmonary Disease

## 2023-10-29 DIAGNOSIS — E042 Nontoxic multinodular goiter: Secondary | ICD-10-CM | POA: Diagnosis not present

## 2023-10-29 DIAGNOSIS — E041 Nontoxic single thyroid nodule: Secondary | ICD-10-CM

## 2023-11-24 ENCOUNTER — Encounter: Payer: Self-pay | Admitting: Pulmonary Disease

## 2023-11-25 ENCOUNTER — Other Ambulatory Visit (HOSPITAL_COMMUNITY): Payer: Self-pay

## 2023-11-25 ENCOUNTER — Telehealth: Payer: Self-pay

## 2023-11-25 NOTE — Telephone Encounter (Signed)
 Test results for Spiriva Handihaler alternatives in the same class are as follows. Patient does have a deductible to meet.   Spiriva Respimat - $35.00 Incruse Ellipta - non formulary/requires pa Tudorza - non formulary/requires pa

## 2023-11-25 NOTE — Telephone Encounter (Signed)
 Pharmacy Patient Advocate Encounter  Test results for Spiriva Handihaler alternatives in the same class are as follows. Patient does have a deductible to meet.   Spiriva Respimat - $35.00 Incruse Ellipta - non formulary/requires pa Tudorza - non formulary/requires pa

## 2023-11-26 MED ORDER — SPIRIVA RESPIMAT 2.5 MCG/ACT IN AERS: 2.0000 | INHALATION_SPRAY | Freq: Every day | RESPIRATORY_TRACT | 5 refills | Status: AC

## 2023-11-26 NOTE — Telephone Encounter (Signed)
 We will substitute with Spiriva Respimat 2 puffs daily.  The company is likely phasing out the HandiHaler in favor of the Respimat.  If she needs to be taught how to use the inhaler we can show her that and give her samples.  The prescription would be for Spiriva Respimat 2.5 mcg, 2 puffs daily.

## 2023-11-30 ENCOUNTER — Other Ambulatory Visit: Payer: Self-pay | Admitting: Family

## 2023-11-30 DIAGNOSIS — I10 Essential (primary) hypertension: Secondary | ICD-10-CM

## 2023-11-30 DIAGNOSIS — E785 Hyperlipidemia, unspecified: Secondary | ICD-10-CM

## 2023-12-01 ENCOUNTER — Other Ambulatory Visit: Payer: Self-pay | Admitting: Family

## 2023-12-01 DIAGNOSIS — I10 Essential (primary) hypertension: Secondary | ICD-10-CM

## 2023-12-01 DIAGNOSIS — K219 Gastro-esophageal reflux disease without esophagitis: Secondary | ICD-10-CM

## 2024-01-14 ENCOUNTER — Ambulatory Visit
Admission: RE | Admit: 2024-01-14 | Discharge: 2024-01-14 | Disposition: A | Discharge status: Home / Self Care | Payer: 59 | Source: Ambulatory Visit | Attending: Pulmonary Disease | Admitting: Pulmonary Disease

## 2024-01-14 DIAGNOSIS — J439 Emphysema, unspecified: Secondary | ICD-10-CM | POA: Diagnosis not present

## 2024-01-14 DIAGNOSIS — R918 Other nonspecific abnormal finding of lung field: Secondary | ICD-10-CM | POA: Diagnosis not present

## 2024-01-14 DIAGNOSIS — I2510 Atherosclerotic heart disease of native coronary artery without angina pectoris: Secondary | ICD-10-CM | POA: Diagnosis not present

## 2024-01-14 DIAGNOSIS — R911 Solitary pulmonary nodule: Secondary | ICD-10-CM

## 2024-01-19 ENCOUNTER — Ambulatory Visit (INDEPENDENT_AMBULATORY_CARE_PROVIDER_SITE_OTHER): Admitting: Family

## 2024-01-19 ENCOUNTER — Encounter: Payer: Self-pay | Admitting: Family

## 2024-01-19 VITALS — BP 130/78 | HR 82 | Temp 98.1°F | Ht 65.0 in | Wt 140.0 lb

## 2024-01-19 DIAGNOSIS — I10 Essential (primary) hypertension: Secondary | ICD-10-CM | POA: Diagnosis not present

## 2024-01-19 DIAGNOSIS — M79641 Pain in right hand: Secondary | ICD-10-CM | POA: Insufficient documentation

## 2024-01-19 DIAGNOSIS — M79642 Pain in left hand: Secondary | ICD-10-CM

## 2024-01-19 DIAGNOSIS — Z1231 Encounter for screening mammogram for malignant neoplasm of breast: Secondary | ICD-10-CM

## 2024-01-19 DIAGNOSIS — I671 Cerebral aneurysm, nonruptured: Secondary | ICD-10-CM

## 2024-01-19 DIAGNOSIS — Z1211 Encounter for screening for malignant neoplasm of colon: Secondary | ICD-10-CM

## 2024-01-19 DIAGNOSIS — I700 Atherosclerosis of aorta: Secondary | ICD-10-CM

## 2024-01-19 LAB — COMPREHENSIVE METABOLIC PANEL WITH GFR
ALT: 16 U/L (ref 0–35)
AST: 25 U/L (ref 0–37)
Albumin: 4.7 g/dL (ref 3.5–5.2)
Alkaline Phosphatase: 70 U/L (ref 39–117)
BUN: 13 mg/dL (ref 6–23)
CO2: 26 meq/L (ref 19–32)
Calcium: 9.8 mg/dL (ref 8.4–10.5)
Chloride: 98 meq/L (ref 96–112)
Creatinine, Ser: 0.67 mg/dL (ref 0.40–1.20)
GFR: 92.33 mL/min (ref >60.00)
Glucose, Bld: 99 mg/dL (ref 70–99)
Potassium: 4.2 meq/L (ref 3.5–5.1)
Sodium: 135 meq/L (ref 135–145)
Total Bilirubin: 0.7 mg/dL (ref 0.2–1.2)
Total Protein: 7.4 g/dL (ref 6.0–8.3)

## 2024-01-19 LAB — CBC WITH DIFFERENTIAL/PLATELET
Basophils Absolute: 0 10*3/uL (ref 0.0–0.1)
Basophils Relative: 0.5 % (ref 0.0–3.0)
Eosinophils Absolute: 0.1 10*3/uL (ref 0.0–0.7)
Eosinophils Relative: 1 % (ref 0.0–5.0)
HCT: 42 % (ref 36.0–46.0)
Hemoglobin: 14 g/dL (ref 12.0–15.0)
Lymphocytes Relative: 33.9 % (ref 12.0–46.0)
Lymphs Abs: 2.1 10*3/uL (ref 0.7–4.0)
MCHC: 33.3 g/dL (ref 30.0–36.0)
MCV: 96.5 fl (ref 78.0–100.0)
Monocytes Absolute: 0.5 10*3/uL (ref 0.1–1.0)
Monocytes Relative: 7.7 % (ref 3.0–12.0)
Neutro Abs: 3.5 10*3/uL (ref 1.4–7.7)
Neutrophils Relative %: 56.9 % (ref 43.0–77.0)
Platelets: 360 10*3/uL (ref 150.0–400.0)
RBC: 4.35 Mil/uL (ref 3.87–5.11)
RDW: 14.4 % (ref 11.5–15.5)
WBC: 6.1 10*3/uL (ref 4.0–10.5)

## 2024-01-19 LAB — LIPID PANEL
Cholesterol: 157 mg/dL (ref 0–200)
HDL: 64.2 mg/dL (ref >39.00)
LDL Cholesterol: 65 mg/dL (ref 0–99)
NonHDL: 93.2
Total CHOL/HDL Ratio: 2
Triglycerides: 141 mg/dL (ref 0.0–149.0)
VLDL: 28.2 mg/dL (ref 0.0–40.0)

## 2024-01-19 LAB — VITAMIN D 25 HYDROXY (VIT D DEFICIENCY, FRACTURES): VITD: 50.09 ng/mL (ref 30.00–100.00)

## 2024-01-19 LAB — HEMOGLOBIN A1C: Hgb A1c MFr Bld: 6.4 % (ref 4.6–6.5)

## 2024-01-19 LAB — TSH: TSH: 0.27 u[IU]/mL — ABNORMAL LOW (ref 0.35–5.50)

## 2024-01-19 NOTE — Assessment & Plan Note (Signed)
Chronic, stable.  Continue Toprol tartrate 25 mg twice daily, losartan 100 mg daily, amlodipine 5 mg daily.  

## 2024-01-19 NOTE — Patient Instructions (Addendum)
 Continue tylenol    May trial over the counter voltaren  gel ( NSAID like ibuprofen) but topical  Referral rheumatology   Let us  know if you dont hear back within a week in regards to an appointment being scheduled.   So that you are aware, if you are [facility] MyChart user , please pay attention to your MyChart messages as you may receive a MyChart message with a phone number to call and schedule this test/appointment own your own from our referral coordinator. This is a new process so I do not want you to miss this message.  If you are not a MyChart user, you will receive a phone call.   Cologuard ordered. Please let us  know if you do not receive.

## 2024-01-19 NOTE — Assessment & Plan Note (Signed)
 Reported history of colon polyps.  No pathology in epic.  Patient declines colonoscopy however I informed her colonoscopy is a better test for history of polyps.  She prefers to move forward with Cologuard.  No family history of colon cancer.  No reported bowel habit changes

## 2024-01-19 NOTE — Progress Notes (Signed)
 Assessment & Plan:  Bilateral hand pain Assessment & Plan: history of rheumatoid arthritis.  Symptoms today would suggest this as well.  Discussed continued use of Tylenol  and advised over-the-counter Voltaren  gel.  Most likely she would benefit from Biologics.  She is open to reconsider this.  Referral placed today.   Orders: -     3D Screening Mammogram, Left and Right; Future -     VITAMIN D  25 Hydroxy (Vit-D Deficiency, Fractures) -     Hemoglobin A1c -     CBC with Differential/Platelet -     Comprehensive metabolic panel with GFR -     Lipid panel -     TSH -     Ambulatory referral to Rheumatology  Cerebral aneurysm without rupture  Encounter for screening mammogram for malignant neoplasm of breast -     3D Screening Mammogram, Left and Right; Future  Screen for colon cancer -     Cologuard  Aortic atherosclerosis (HCC) -     Hemoglobin A1c -     Lipid panel  Primary hypertension Assessment & Plan: Chronic, stable.  Continue Toprol  tartrate 25 mg twice daily, losartan  100 mg daily, amlodipine  5 mg daily.   Orders: -     Comprehensive metabolic panel with GFR -     Lipid panel -     TSH  Colon cancer screening Assessment & Plan: Reported history of colon polyps.  No pathology in epic.  Patient declines colonoscopy however I informed her colonoscopy is a better test for history of polyps.  She prefers to move forward with Cologuard.  No family history of colon cancer.  No reported bowel habit changes      Return precautions given.   Risks, benefits, and alternatives of the medications and treatment plan prescribed today were discussed, and patient expressed understanding.   Education regarding symptom management and diagnosis given to patient on AVS either electronically or printed.  Return for Complete Physical Exam.  Bascom Bossier, FNP  Subjective:    Patient ID: Brooke Murphy, female    DOB: 04/27/1961, 63 y.o.   MRN: [id number]  CC: Brooke Murphy  is a 63 y.o. female who presents today for follow up.   HPI: Complains BL hand pain over knuckles. She has been taking Tylenol  with some relief.  Previously followed with rheumatology  No swelling, redness, abnormal fingernails or rash, wrist pain.   She is exercising regularly and walking    H/o cerebral aneurysm, following with Dr Nelda Balsam; VV 08/06/23;plan for an additional CTA in 1 year and then likely space her follow up imaging out even further   Last colonoscopy 12 years ago; reported polyps ( cannot see pathology) She declines colonoscopy No changes to bowel habits, stool diameter. Denies rectal bleeding No family hx colon cancer     Allergies: Celebrex [celecoxib], Lodine [etodolac], and Penicillins Current Outpatient Medications on File Prior to Visit  Medication Sig Dispense Refill   acetaminophen  (TYLENOL ) 500 MG tablet Take 1,000 mg by mouth every 8 (eight) hours as needed.     albuterol  (VENTOLIN  HFA) 108 (90 Base) MCG/ACT inhaler Inhale 2 puffs into the lungs every 6 (six) hours as needed for wheezing or shortness of breath (or cough). 18 each 2   amLODipine  (NORVASC ) 5 MG tablet TAKE 1 TABLET (5 MG TOTAL) BY MOUTH DAILY. 90 tablet 3   aspirin  EC 81 MG tablet Take 1 tablet (81 mg total) by mouth daily. Swallow whole. 90 tablet 3  atorvastatin  (LIPITOR) 20 MG tablet TAKE 1 TABLET BY MOUTH EVERY DAY 90 tablet 3   fluticasone  (FLONASE ) 50 MCG/ACT nasal spray Place 2 sprays into both nostrils daily. (Patient not taking: Reported on 01/19/2024) 16 g 6   ipratropium-albuterol  (DUONEB) 0.5-2.5 (3) MG/3ML SOLN Take 3 mLs by nebulization every 4 (four) hours as needed. (Patient not taking: Reported on 02/13/2023) 360 mL 1   losartan  (COZAAR ) 100 MG tablet TAKE 1 TABLET BY MOUTH EVERY DAY 90 tablet 3   metoprolol  tartrate (LOPRESSOR ) 25 MG tablet TAKE 1 TABLET BY MOUTH TWICE A DAY 180 tablet 3   nitrofurantoin , macrocrystal-monohydrate, (MACROBID ) 100 MG capsule Take 1 capsule (100  mg total) by mouth 2 (two) times daily. (Patient not taking: Reported on 01/19/2024) 10 capsule 0   omeprazole  (PRILOSEC) 20 MG capsule TAKE 1 CAPSULE BY MOUTH EVERY DAY 90 capsule 3   Tiotropium Bromide  Monohydrate (SPIRIVA  RESPIMAT) 2.5 MCG/ACT AERS Inhale 2 puffs into the lungs daily. 4 g 5   No current facility-administered medications on file prior to visit.    Review of Systems  Constitutional:  Negative for chills and fever.  Respiratory:  Negative for cough.   Cardiovascular:  Negative for chest pain and palpitations.  Gastrointestinal:  Negative for nausea and vomiting.  Musculoskeletal:  Positive for arthralgias.      Objective:    BP 130/78   Pulse 82   Temp 98.1 F (36.7 C) (Oral)   Ht 5\' 5"  (1.651 m)   Wt 140 lb (63.5 kg)   SpO2 97%   BMI 23.30 kg/m  BP Readings from Last 3 Encounters:  01/19/24 130/78  10/16/23 132/82  09/09/23 128/82   Wt Readings from Last 3 Encounters:  01/19/24 140 lb (63.5 kg)  10/16/23 135 lb 12.8 oz (61.6 kg)  09/09/23 135 lb 6.4 oz (61.4 kg)    Physical Exam Vitals reviewed.  Constitutional:      Appearance: She is well-developed.  Eyes:     Conjunctiva/sclera: Conjunctivae normal.  Cardiovascular:     Rate and Rhythm: Normal rate and regular rhythm.     Pulses: Normal pulses.     Heart sounds: Normal heart sounds.  Pulmonary:     Effort: Pulmonary effort is normal.     Breath sounds: Normal breath sounds. No wheezing, rhonchi or rales.  Musculoskeletal:     Comments: Mild tenderness over BL MCP .  Swelling to extremity right fifth metacarpal.  no erythema, edema, rash.    Skin:    General: Skin is warm and dry.  Neurological:     Mental Status: She is alert.  Psychiatric:        Speech: Speech normal.        Behavior: Behavior normal.        Thought Content: Thought content normal.

## 2024-01-19 NOTE — Assessment & Plan Note (Signed)
 history of rheumatoid arthritis.  Symptoms today would suggest this as well.  Discussed continued use of Tylenol  and advised over-the-counter Voltaren  gel.  Most likely she would benefit from Biologics.  She is open to reconsider this.  Referral placed today.

## 2024-01-22 ENCOUNTER — Other Ambulatory Visit: Payer: Self-pay

## 2024-01-22 ENCOUNTER — Ambulatory Visit

## 2024-01-22 ENCOUNTER — Ambulatory Visit (INDEPENDENT_AMBULATORY_CARE_PROVIDER_SITE_OTHER): Admitting: Pulmonary Disease

## 2024-01-22 DIAGNOSIS — J439 Emphysema, unspecified: Secondary | ICD-10-CM

## 2024-01-22 DIAGNOSIS — J449 Chronic obstructive pulmonary disease, unspecified: Secondary | ICD-10-CM

## 2024-01-22 LAB — PULMONARY FUNCTION TEST ARMC ONLY
DL/VA % pred: 52 %
DL/VA: 2.2 ml/min/mmHg/L
DLCO cor % pred: 48 %
DLCO cor: 9.98 ml/min/mmHg
DLCO unc % pred: 49 %
DLCO unc: 10.16 ml/min/mmHg
FEF 25-75 Post: 1.08 L/s
FEF 25-75 Pre: 1.1 L/s
FEF2575-%Change-Post: -1 %
FEF2575-%Pred-Post: 48 %
FEF2575-%Pred-Pre: 49 %
FEV1-%Change-Post: -1 %
FEV1-%Pred-Post: 80 %
FEV1-%Pred-Pre: 81 %
FEV1-Post: 2.04 L
FEV1-Pre: 2.07 L
FEV1FVC-%Change-Post: -1 %
FEV1FVC-%Pred-Pre: 85 %
FEV6-%Change-Post: 0 %
FEV6-%Pred-Post: 97 %
FEV6-%Pred-Pre: 96 %
FEV6-Post: 3.1 L
FEV6-Pre: 3.08 L
FEV6FVC-%Change-Post: 0 %
FEV6FVC-%Pred-Post: 102 %
FEV6FVC-%Pred-Pre: 102 %
FVC-%Change-Post: 0 %
FVC-%Pred-Post: 94 %
FVC-%Pred-Pre: 94 %
FVC-Post: 3.15 L
FVC-Pre: 3.14 L
Post FEV1/FVC ratio: 65 %
Post FEV6/FVC ratio: 98 %
Pre FEV1/FVC ratio: 66 %
Pre FEV6/FVC Ratio: 98 %
RV % pred: 79 %
RV: 1.68 L
TLC % pred: 93 %
TLC: 4.84 L

## 2024-01-22 NOTE — Patient Instructions (Signed)
 Full PFT completed today ? ?

## 2024-01-22 NOTE — Progress Notes (Signed)
 Full PFT completed today ? ?

## 2024-01-25 ENCOUNTER — Telehealth: Payer: Self-pay | Admitting: Family

## 2024-01-25 DIAGNOSIS — M79641 Pain in right hand: Secondary | ICD-10-CM

## 2024-01-25 NOTE — Telephone Encounter (Signed)
 Call patient  I had  send referral to rheumatology due to patient's bilateral hand pain. She reported history of rheumatoid arthritis.  Please ask if she has been formally diagnosed or ever seen by rheumatology. Rheumatology is not quite accepting referral without further information.    I recommend labs and x-rays of bilateral hands.  Please schedule lab to evaluate for rheumatoid arthritis, x-rays of hands with next couple weeks.  The labs are nonfasting

## 2024-01-26 ENCOUNTER — Other Ambulatory Visit: Payer: Self-pay

## 2024-01-26 DIAGNOSIS — R911 Solitary pulmonary nodule: Secondary | ICD-10-CM

## 2024-01-26 NOTE — Telephone Encounter (Signed)
 Does she have any of these rheumatology notes from 2002?  I would like to include with referral

## 2024-01-26 NOTE — Telephone Encounter (Signed)
 Spoke to pt she has seen rheumatology and had diagnoses before in 2002 I did schedule her a lab appt and xrays

## 2024-01-27 NOTE — Telephone Encounter (Signed)
 Spoke to pt she stated that it was at a hospital in [place], does not remember Dr name though

## 2024-01-28 ENCOUNTER — Encounter: Payer: Self-pay | Admitting: Pulmonary Disease

## 2024-01-28 ENCOUNTER — Ambulatory Visit (INDEPENDENT_AMBULATORY_CARE_PROVIDER_SITE_OTHER): Payer: 59 | Admitting: Pulmonary Disease

## 2024-01-28 VITALS — BP 136/86 | HR 57 | Temp 97.6°F | Ht 65.0 in | Wt 136.8 lb

## 2024-01-28 DIAGNOSIS — Z87891 Personal history of nicotine dependence: Secondary | ICD-10-CM | POA: Diagnosis not present

## 2024-01-28 DIAGNOSIS — N179 Acute kidney failure, unspecified: Secondary | ICD-10-CM | POA: Diagnosis not present

## 2024-01-28 DIAGNOSIS — Z8709 Personal history of other diseases of the respiratory system: Secondary | ICD-10-CM

## 2024-01-28 DIAGNOSIS — R918 Other nonspecific abnormal finding of lung field: Secondary | ICD-10-CM | POA: Diagnosis not present

## 2024-01-28 NOTE — Progress Notes (Signed)
 Subjective:    Patient ID: Brooke Murphy, female    DOB: 03/12/1961, 63 y.o.   MRN: [id number]  Patient Care Team: Calista Catching, FNP as PCP - General (Family Medicine) Rochell Chroman, RN as Registered Nurse Marc Senior, MD as Consulting Physician (Pulmonary Disease)  Chief Complaint  Patient presents with   Follow-up    Occasional cough. No shortness of breath or wheezing.    BACKGROUND/INTERVAL:Brooke Murphy is a 63 year old former smoker (quit July 2023, 37 PY) who presents for follow-up on COPD with significant emphysema on imaging. She also is being followed for a lung nodules seen on CT.  We last saw her on 16 October 2023 at that time she was instructed to continue Spiriva  and reordered pulmonary function testing which she had missed previously.  For evaluation of a dominant pulmonary nodule she had a PET/CT performed on 21 March 2021 that did not show FDG avidity on the nodule in question.  She had a low-dose CT chest for lung cancer screening purposes performed on 29 March 2022 and this showed that the nodule of interest on the prior exam had decreased in volume and regressed to 6 mm in diameter.  Prior CT of the chest also showed significant emphysema.  She had PFTs on 22 Jan 2024.  She has not relapsed since quitting smoking.  No recent exacerbations.  Lung cancer screening CT performed 12 August 2023 showed persistence of pulmonary cyst in the posterior segment of the right upper lobe with enlarging nodular component.  She underwent PET/CT on 01 October 2023 showing maximum SUV of 1.9 on this lesion which could be inflammatory or due to low-grade adenocarcinoma.  There was another area of low-level activity maximum SUV of 1.8 on the left which was attributed to chronic scarring or atelectasis.  CT chest 14 January 2024 shows that the mural thickening along the lateral aspect of the left apical bulla is stable in thickness and configuration from prior and is considered now benign after  PET/CT performed in January 2025.  All other nodules are stable.  She has significant emphysema.  HPI Discussed the use of AI scribe software for clinical note transcription with the patient, who gave verbal consent to proceed.  History of Present Illness   Brooke Murphy is a 63 year old female with COPD and lung nodules who presents for follow-up.  She has COPD with emphysema, with lung function tests showing approximately 81% capacity and a FEV1/FVC ratio of 66%, indicating moderate COPD. She uses Spiriva  daily and albuterol  as needed, approximately every seven days, which effectively manages her symptoms. She does not currently smoke.  She has a history of an spontaneous pneumothorax in 1999, close of the left lung. Since then, she occasionally experiences a sensation of tightness on the left side, described as 'the bra tightening up,' which resolves spontaneously. She attributes this sensation to scar tissue from the previous lung collapse and treatment.  She is under the care of a cardiologist and has had an echocardiogram showing good heart function. She mentions the presence of calcium  in her coronary arteries.     DATA 01/14/2024 CT chest without contrast: Nodular mural thickening along the lateral aspect of the left apical bulla stable in thickness and configuration from prior examination of 08/17/2018 and was not metabolically active on PET/CT examination of 10/01/2023 and is safely considered benign.  Rim calcified nodule within the right apex is similarly stable since remote prior CT examination and safely considered benign.  Atypical pulmonary cyst within the posterior right upper lobe is stable in size measuring 1.3 x 3.5 cm with a stable bilobed solid component measuring 10 x 19 mm at this level.  Follow-up lung cancer screening study recommended in 6 months to confirm continued stability.  Moderate multilevel coronary artery calcification.  Severe emphysema. 01/22/2024 PFTs: FEV1 2.07  L or 81% predicted, FVC 3.14 L or 94% predicted, FEV1/FVC 66%, lung volumes normal diffusion capacity moderately to severely decreased.  No bronchodilator response.  Consistent with moderate obstructive airways disease and moderately to severe diffusion defect consistent with emphysema.  Review of Systems A 10 point review of systems was performed and it is as noted above otherwise negative.   Patient Active Problem List   Diagnosis Date Noted   Bilateral hand pain 01/19/2024   Colon cancer screening 01/19/2024   Urinary frequency 02/22/2023   Cerebral aneurysm without rupture 12/25/2022   Elevated vitamin B12 level 12/24/2022   Left facial numbness 12/23/2022   Cervical high risk HPV (human papillomavirus) test positive 12/06/2022   Skin rash 11/07/2020   Suprapubic pressure 10/09/2018   Prediabetes 10/05/2018   Adnexal mass 10/05/2018   Constipation 10/05/2018   Hypokalemia 09/23/2018   COPD with acute exacerbation (HCC) 09/23/2018   Aortic atherosclerosis (HCC) 09/09/2018   Smoker 07/20/2018   Goiter 07/20/2018   Right arm pain 07/20/2018   Encounter for screening for malignant neoplasm of respiratory organs 07/20/2018   Headache 11/17/2017   Sinus pressure 11/17/2017   Hyperthyroidism 04/29/2017   Routine physical examination 02/12/2017   GERD (gastroesophageal reflux disease) 08/27/2016   HLD (hyperlipidemia) 08/27/2016   Depression 08/27/2016   Right shoulder pain 06/15/2015   Arthritis 04/08/2015   HTN (hypertension) 12/10/2014   Allergic rhinitis 12/10/2014   Thoracic back pain 12/10/2014    Social History   Tobacco Use   Smoking status: Former    Current packs/day: 0.00    Average packs/day: 1 pack/day for 37.0 years (37.0 ttl pk-yrs)    Types: Cigarettes    Start date: 03/1985    Quit date: 03/2022    Years since quitting: 1.8   Smokeless tobacco: Never  Substance Use Topics   Alcohol use: No    Alcohol/week: 0.0 standard drinks of alcohol     Allergies  Allergen Reactions   Celebrex [Celecoxib] Rash    Rash. Upset stomach   Lodine [Etodolac] Rash    Rash. Upset stomach   Penicillins Rash    Has patient had a PCN reaction causing immediate rash, facial/tongue/throat swelling, SOB or lightheadedness with hypotension: Yes Has patient had a PCN reaction causing severe rash involving mucus membranes or skin necrosis: No Has patient had a PCN reaction that required hospitalization: No Has patient had a PCN reaction occurring within the last 10 years: No If all of the above answers are "NO", then may proceed with Cephalosporin use.    Current Meds  Medication Sig   acetaminophen  (TYLENOL ) 500 MG tablet Take 1,000 mg by mouth every 8 (eight) hours as needed.   albuterol  (VENTOLIN  HFA) 108 (90 Base) MCG/ACT inhaler Inhale 2 puffs into the lungs every 6 (six) hours as needed for wheezing or shortness of breath (or cough).   amLODipine  (NORVASC ) 5 MG tablet TAKE 1 TABLET (5 MG TOTAL) BY MOUTH DAILY.   aspirin  EC 81 MG tablet Take 1 tablet (81 mg total) by mouth daily. Swallow whole.   atorvastatin  (LIPITOR) 20 MG tablet TAKE 1 TABLET BY MOUTH EVERY DAY  fluticasone  (FLONASE ) 50 MCG/ACT nasal spray Place 2 sprays into both nostrils daily.   ipratropium-albuterol  (DUONEB) 0.5-2.5 (3) MG/3ML SOLN Take 3 mLs by nebulization every 4 (four) hours as needed.   losartan  (COZAAR ) 100 MG tablet TAKE 1 TABLET BY MOUTH EVERY DAY   metoprolol  tartrate (LOPRESSOR ) 25 MG tablet TAKE 1 TABLET BY MOUTH TWICE A DAY   nitrofurantoin , macrocrystal-monohydrate, (MACROBID ) 100 MG capsule Take 1 capsule (100 mg total) by mouth 2 (two) times daily.   omeprazole  (PRILOSEC) 20 MG capsule TAKE 1 CAPSULE BY MOUTH EVERY DAY   Tiotropium Bromide  Monohydrate (SPIRIVA  RESPIMAT) 2.5 MCG/ACT AERS Inhale 2 puffs into the lungs daily.    Immunization History  Administered Date(s) Administered   PFIZER(Purple Top)SARS-COV-2 Vaccination 05/24/2020, 06/15/2020       Objective:   BP 136/86 (BP Location: Left Arm, Patient Position: Sitting, Cuff Size: Normal)   Pulse (!) 57   Temp 97.6 F (36.4 C) (Temporal)   Ht 5\' 5"  (1.651 m)   Wt 136 lb 12.8 oz (62.1 kg)   SpO2 97%   BMI 22.76 kg/m   SpO2: 97 %  GENERAL: Well-developed well-nourished woman, no acute distress.  Fully ambulatory.  No conversational dyspnea. HEAD: Normocephalic, atraumatic.  EYES: Pupils equal, round, reactive to light.  No scleral icterus.  MOUTH: Teeth show areas of chipping.  Oral mucosa moist.  No thrush. NECK: Supple. No thyromegaly. Trachea midline. No JVD.  No adenopathy. PULMONARY: Good air entry bilaterally.  No adventitious sounds. CARDIOVASCULAR: S1 and S2. Regular rate and rhythm.  No rubs, murmurs or gallops heard. ABDOMEN: Benign. MUSCULOSKELETAL: No joint deformity, no clubbing, no edema.  NEUROLOGIC: No focal deficit, no gait disturbance, speech is fluent. SKIN: Intact,warm,dry. PSYCH: Mood and behavior normal.   Discussed PFTs with patient.   Assessment & Plan:     ICD-10-CM   1. Stage 2 acute kidney injury (HCC)  N17.9     2. Lung nodules  R91.8     3. History of pneumothorax  Z87.09     4. Former heavy cigarette smoker (20-39 per day)  Z87.891      Discussion:    Chronic Obstructive Pulmonary Disease (COPD) with emphysema Moderate COPD with emphysema, lung function at 81%, and FEV1/FVC ratio at 66%. Emphysema is present but not causing severe impairment. Lung function is well preserved despite emphysema seen on CT. She is a non-smoker. Effective use of Spiriva  and albuterol .  - Continue Spiriva  daily - Use albuterol  as needed  Lung nodules Lung nodules are well-managed with no activity on PET CT. One nodule identified as a cyst with a scar, and two others are very small. No immediate concern but requires monitoring. - Order follow-up CT scan in 6 months     Advised if symptoms do not improve or worsen, to please contact office for sooner  follow up or seek emergency care.    I spent 33 minutes of dedicated to the care of this patient on the date of this encounter to include pre-visit review of records, face-to-face time with the patient discussing conditions above, post visit ordering of testing, clinical documentation with the electronic health record, making appropriate referrals as documented, and communicating necessary findings to members of the patients care team.     C. Chloe Counter, MD Advanced Bronchoscopy PCCM [facility]    *This note was generated using voice recognition software/Dragon and/or AI transcription program.  Despite best efforts to proofread, errors can occur which can change the meaning. Any  transcriptional errors that result from this process are unintentional and may not be fully corrected at the time of dictation.

## 2024-01-28 NOTE — Telephone Encounter (Signed)
 noted

## 2024-01-28 NOTE — Patient Instructions (Signed)
 VISIT SUMMARY:  Today, we discussed your COPD with emphysema and lung nodules. Your lung function remains stable, and your current medications are effectively managing your symptoms. We also reviewed your history of an aneurysm and the occasional tightness you feel on the left side, which is likely due to scar tissue from your previous lung collapse. Your heart function is good, and there are no immediate concerns regarding your lung nodules, but we will continue to monitor them.  YOUR PLAN:  -CHRONIC OBSTRUCTIVE PULMONARY DISEASE (COPD) WITH EMPHYSEMA: COPD is a chronic lung disease that makes it hard to breathe, and emphysema is a type of COPD that damages the air sacs in the lungs. Your lung function is at 81%, which is considered mild to moderate COPD. You should continue using Spiriva  daily and albuterol  as needed to manage your symptoms. The occasional tightness you feel on the left side is likely due to nerve damage from your previous lung collapse and scarring.  -LUNG NODULES: Lung nodules are small growths in the lungs. Your nodules are stable with no activity on the PET CT scan. One nodule is identified as a cyst with a scar, and the other two are very small. There is no immediate concern, but we will monitor them with a follow-up CT scan in 6 months.  INSTRUCTIONS:  Please continue using Spiriva  daily and albuterol  as needed. Schedule a follow-up CT scan in 6 months to monitor your lung nodules.

## 2024-01-29 ENCOUNTER — Other Ambulatory Visit: Payer: Self-pay | Admitting: Family

## 2024-01-29 ENCOUNTER — Encounter: Payer: Self-pay | Admitting: Family

## 2024-01-29 DIAGNOSIS — R899 Unspecified abnormal finding in specimens from other organs, systems and tissues: Secondary | ICD-10-CM

## 2024-01-29 DIAGNOSIS — H40153 Residual stage of open-angle glaucoma, bilateral: Secondary | ICD-10-CM | POA: Diagnosis not present

## 2024-01-30 ENCOUNTER — Ambulatory Visit

## 2024-01-30 ENCOUNTER — Ambulatory Visit (INDEPENDENT_AMBULATORY_CARE_PROVIDER_SITE_OTHER)

## 2024-01-30 ENCOUNTER — Other Ambulatory Visit (INDEPENDENT_AMBULATORY_CARE_PROVIDER_SITE_OTHER)

## 2024-01-30 DIAGNOSIS — M1811 Unilateral primary osteoarthritis of first carpometacarpal joint, right hand: Secondary | ICD-10-CM | POA: Diagnosis not present

## 2024-01-30 DIAGNOSIS — M79641 Pain in right hand: Secondary | ICD-10-CM

## 2024-01-30 DIAGNOSIS — R899 Unspecified abnormal finding in specimens from other organs, systems and tissues: Secondary | ICD-10-CM

## 2024-01-30 DIAGNOSIS — M1812 Unilateral primary osteoarthritis of first carpometacarpal joint, left hand: Secondary | ICD-10-CM | POA: Diagnosis not present

## 2024-01-30 DIAGNOSIS — M79642 Pain in left hand: Secondary | ICD-10-CM

## 2024-01-30 LAB — URIC ACID: Uric Acid, Serum: 3.1 mg/dL (ref 2.4–7.0)

## 2024-01-30 LAB — C-REACTIVE PROTEIN: CRP: <1 mg/dL (ref 0.5–20.0)

## 2024-01-30 LAB — TSH: TSH: 0.17 u[IU]/mL — ABNORMAL LOW (ref 0.35–5.50)

## 2024-01-30 LAB — T4, FREE: Free T4: 0.76 ng/dL (ref 0.60–1.60)

## 2024-01-30 LAB — SEDIMENTATION RATE: Sed Rate: 13 mm/h (ref 0–30)

## 2024-01-30 LAB — T3, FREE: T3, Free: 3.8 pg/mL (ref 2.3–4.2)

## 2024-02-02 ENCOUNTER — Encounter: Payer: Self-pay | Admitting: Family

## 2024-02-02 LAB — ANA: Anti Nuclear Antibody (ANA): NEGATIVE

## 2024-02-02 LAB — RHEUMATOID FACTOR: Rheumatoid fact SerPl-aCnc: <10 [IU]/mL (ref <14)

## 2024-02-03 LAB — CYCLIC CITRUL PEPTIDE ANTIBODY, IGG/IGA: Cyclic Citrullin Peptide Ab: 9 U (ref 0–19)

## 2024-02-04 LAB — COLOGUARD: COLOGUARD: POSITIVE — AB

## 2024-02-05 ENCOUNTER — Ambulatory Visit: Payer: Self-pay | Admitting: Family

## 2024-02-05 DIAGNOSIS — R195 Other fecal abnormalities: Secondary | ICD-10-CM

## 2024-02-13 ENCOUNTER — Other Ambulatory Visit: Payer: Self-pay | Admitting: Family

## 2024-02-13 DIAGNOSIS — E049 Nontoxic goiter, unspecified: Secondary | ICD-10-CM

## 2024-02-13 DIAGNOSIS — E0590 Thyrotoxicosis, unspecified without thyrotoxic crisis or storm: Secondary | ICD-10-CM

## 2024-02-26 DIAGNOSIS — Z860100 Personal history of colon polyps, unspecified: Secondary | ICD-10-CM | POA: Diagnosis not present

## 2024-02-26 DIAGNOSIS — Z01818 Encounter for other preprocedural examination: Secondary | ICD-10-CM | POA: Diagnosis not present

## 2024-03-10 ENCOUNTER — Encounter: Payer: Self-pay | Admitting: Family

## 2024-03-12 ENCOUNTER — Other Ambulatory Visit: Payer: Self-pay | Admitting: Family

## 2024-03-12 ENCOUNTER — Telehealth: Payer: Self-pay | Admitting: Family

## 2024-03-12 ENCOUNTER — Other Ambulatory Visit

## 2024-03-12 DIAGNOSIS — R899 Unspecified abnormal finding in specimens from other organs, systems and tissues: Secondary | ICD-10-CM

## 2024-03-12 LAB — T3, FREE: T3, Free: 3.5 pg/mL (ref 2.3–4.2)

## 2024-03-12 LAB — TSH: TSH: 0.25 u[IU]/mL — ABNORMAL LOW (ref 0.35–5.50)

## 2024-03-12 LAB — T4, FREE: Free T4: 0.76 ng/dL (ref 0.60–1.60)

## 2024-03-12 NOTE — Telephone Encounter (Signed)
 Patient need lab orders.

## 2024-03-15 ENCOUNTER — Ambulatory Visit: Payer: Self-pay | Admitting: Family

## 2024-03-15 NOTE — Telephone Encounter (Signed)
 Called [facility] they have to reach out to referral coordinator and they will reach out to pt and get her scheduled. Pt has been notified

## 2024-03-16 ENCOUNTER — Encounter
Admission: RE | Disposition: A | Discharge status: Home / Self Care | Payer: Self-pay | Source: Home / Self Care | Attending: Internal Medicine

## 2024-03-16 ENCOUNTER — Ambulatory Visit
Admission: RE | Admit: 2024-03-16 | Discharge: 2024-03-16 | Disposition: A | Discharge status: Home / Self Care | Attending: Internal Medicine | Admitting: Internal Medicine

## 2024-03-16 ENCOUNTER — Encounter: Payer: Self-pay | Admitting: Internal Medicine

## 2024-03-16 ENCOUNTER — Ambulatory Visit: Admitting: Certified Registered"

## 2024-03-16 DIAGNOSIS — I10 Essential (primary) hypertension: Secondary | ICD-10-CM | POA: Insufficient documentation

## 2024-03-16 DIAGNOSIS — K219 Gastro-esophageal reflux disease without esophagitis: Secondary | ICD-10-CM | POA: Insufficient documentation

## 2024-03-16 DIAGNOSIS — Z87891 Personal history of nicotine dependence: Secondary | ICD-10-CM | POA: Diagnosis not present

## 2024-03-16 DIAGNOSIS — K5730 Diverticulosis of large intestine without perforation or abscess without bleeding: Secondary | ICD-10-CM | POA: Diagnosis not present

## 2024-03-16 DIAGNOSIS — Z860100 Personal history of colon polyps, unspecified: Secondary | ICD-10-CM | POA: Diagnosis not present

## 2024-03-16 DIAGNOSIS — K640 First degree hemorrhoids: Secondary | ICD-10-CM | POA: Insufficient documentation

## 2024-03-16 DIAGNOSIS — K621 Rectal polyp: Secondary | ICD-10-CM | POA: Diagnosis not present

## 2024-03-16 DIAGNOSIS — Z1211 Encounter for screening for malignant neoplasm of colon: Secondary | ICD-10-CM | POA: Insufficient documentation

## 2024-03-16 DIAGNOSIS — J449 Chronic obstructive pulmonary disease, unspecified: Secondary | ICD-10-CM | POA: Insufficient documentation

## 2024-03-16 HISTORY — PX: COLONOSCOPY: SHX5424

## 2024-03-16 SURGERY — COLONOSCOPY
Anesthesia: General

## 2024-03-16 MED ORDER — SODIUM CHLORIDE 0.9 % IV SOLN: INTRAVENOUS | Status: DC

## 2024-03-16 MED ORDER — PROPOFOL 10 MG/ML IV BOLUS
INTRAVENOUS | Status: DC | PRN
  Administered 2024-03-16: 50 mg via INTRAVENOUS

## 2024-03-16 MED ORDER — LIDOCAINE HCL (CARDIAC) PF 100 MG/5ML IV SOSY
PREFILLED_SYRINGE | INTRAVENOUS | Status: DC | PRN
  Administered 2024-03-16: 50 mg via INTRAVENOUS

## 2024-03-16 MED ORDER — PROPOFOL 500 MG/50ML IV EMUL
INTRAVENOUS | Status: DC | PRN
  Administered 2024-03-16: 150 ug/kg/min via INTRAVENOUS

## 2024-03-16 NOTE — Transfer of Care (Signed)
 Immediate Anesthesia Transfer of Care Note  Patient: Ailie Gage  Procedure(s) Performed: COLONOSCOPY  Patient Location: PACU and Endoscopy Unit  Anesthesia Type:General  Level of Consciousness: awake, drowsy, and patient cooperative  Airway & Oxygen Therapy: Patient Spontanous Breathing and Patient connected to face mask oxygen  Post-op Assessment: Report given to RN and Post -op Vital signs reviewed and stable  Post vital signs: Reviewed and stable  Last Vitals:  Vitals Value Taken Time  BP    Temp    Pulse 62 03/16/24 15:37  Resp 21 03/16/24 15:37  SpO2 100 % 03/16/24 15:37  Vitals shown include unfiled device data.  Last Pain:  Vitals:   03/16/24 1359  TempSrc: Temporal  PainSc: 0-No pain         Complications: No notable events documented.

## 2024-03-16 NOTE — Anesthesia Procedure Notes (Signed)
 Procedure Name: MAC Date/Time: 03/16/2024 3:25 PM  Performed by: Lacretia Camelia NOVAK, CRNAPre-anesthesia Checklist: Patient identified, Emergency Drugs available, Suction available and Patient being monitored Patient Re-evaluated:Patient Re-evaluated prior to induction Oxygen Delivery Method: Nasal cannula

## 2024-03-16 NOTE — Interval H&P Note (Signed)
 History and Physical Interval Note:  03/16/2024 1:34 PM  Brooke Murphy  has presented today for surgery, with the diagnosis of History of colon polyps (Z86.0100).  The various methods of treatment have been discussed with the patient and family. After consideration of risks, benefits and other options for treatment, the patient has consented to  Procedure(s): COLONOSCOPY (N/A) as a surgical intervention.  The patient's history has been reviewed, patient examined, no change in status, stable for surgery.  I have reviewed the patient's chart and labs.  Questions were answered to the patient's satisfaction.     [place], Ia Leeb

## 2024-03-16 NOTE — Anesthesia Postprocedure Evaluation (Signed)
 Anesthesia Post Note  Patient: Brooke Murphy  Procedure(s) Performed: COLONOSCOPY  Patient location during evaluation: Endoscopy Anesthesia Type: General Level of consciousness: awake and alert Pain management: pain level controlled Vital Signs Assessment: post-procedure vital signs reviewed and stable Respiratory status: spontaneous breathing, nonlabored ventilation, respiratory function stable and patient connected to nasal cannula oxygen Cardiovascular status: blood pressure returned to baseline and stable Postop Assessment: no apparent nausea or vomiting Anesthetic complications: no   No notable events documented.   Last Vitals:  Vitals:   03/16/24 1359 03/16/24 1536  BP: (!) 143/73 119/73  Pulse:  63  Resp: 16 (!) 21  Temp: (!) 35.8 C (!) 35.8 C  SpO2: 100% 100%    Last Pain:  Vitals:   03/16/24 1536  TempSrc: Temporal  PainSc: Asleep                 R[place] Ade

## 2024-03-16 NOTE — Anesthesia Preprocedure Evaluation (Addendum)
 Anesthesia Evaluation  Patient identified by MRN, date of birth, ID band Patient awake    Reviewed: Allergy & Precautions, H&P , NPO status , Patient's Chart, lab work & pertinent test results  Airway Mallampati: II  TM Distance: >3 FB Neck ROM: full    Dental no notable dental hx.    Pulmonary COPD, former smoker History of pneumothorax    Pulmonary exam normal        Cardiovascular hypertension, Normal cardiovascular exam  TEE;  1. Left ventricular ejection fraction, by estimation, is 65 to 70%. The  left ventricle has normal function. The left ventricle has no regional  wall motion abnormalities. Left ventricular diastolic parameters were  normal.   2. Right ventricular systolic function is normal. The right ventricular  size is normal. There is normal pulmonary artery systolic pressure.   3. The mitral valve is normal in structure. No evidence of mitral valve  regurgitation. No evidence of mitral stenosis.   4. The aortic valve is normal in structure. Aortic valve regurgitation is  not visualized. Aortic valve sclerosis is present, with no evidence of  aortic valve stenosis.   5. The inferior vena cava is normal in size with greater than 50%  respiratory variability, suggesting right atrial pressure of 3 mmHg.     Neuro/Psych  negative psych ROS   GI/Hepatic Neg liver ROS,GERD  ,,  Endo/Other   Hyperthyroidism Goiter  Renal/GU negative Renal ROS  negative genitourinary   Musculoskeletal  (+) Arthritis , Rheumatoid disorders,    Abdominal Normal abdominal exam  (+)   Peds  Hematology negative hematology ROS (+)   Anesthesia Other Findings Past Medical History: No date: AAA (abdominal aortic aneurysm) (HCC) 1999: Brain aneurysm     Comment:  [facility] No date: GERD (gastroesophageal reflux disease) No date: Hyperlipidemia No date: Hypertension No date: Hyperthyroidism 2002: Rheumatoid arthritis (HCC)      Comment:  s/p methotrexate, humira and embrel No date: Thyroid  nodule No date: Ulcer of abdomen wall (HCC) No date: Vitamin D  deficiency  Past Surgical History: 2005: ABDOMINAL HYSTERECTOMY     Comment:  ovaries remain; WITH CERVIX 11/27/1997: BRAIN SURGERY; Left     Comment:  aneurysm No date: coil embolization No date: COLONOSCOPY 11/11/2018: LAPAROSCOPIC BILATERAL SALPINGO OOPHERECTOMY     Comment:  Procedure: LAPAROSCOPIC BILATERAL SALPINGO OOPHORECTOMY;              Surgeon: Verdon Keen, MD;  Location: [facility] ORS;                Service: Gynecology;;     Reproductive/Obstetrics negative OB ROS                             Anesthesia Physical Anesthesia Plan  ASA: 2  Anesthesia Plan: General   Post-op Pain Management:    Induction: Intravenous  PONV Risk Score and Plan: Propofol  infusion and TIVA  Airway Management Planned: Natural Airway  Additional Equipment:   Intra-op Plan:   Post-operative Plan:   Informed Consent: I have reviewed the patients History and Physical, chart, labs and discussed the procedure including the risks, benefits and alternatives for the proposed anesthesia with the patient or authorized representative who has indicated his/her understanding and acceptance.     Dental Advisory Given  Plan Discussed with: CRNA and Surgeon  Anesthesia Plan Comments:         Anesthesia Quick Evaluation

## 2024-03-16 NOTE — H&P (Signed)
 Outpatient short stay form Pre-procedure 03/16/2024 1:33 PM Nature Kueker K. Aundria, M.D.  Primary Physician: [name], FNP  Reason for visit:  Personal history of colon polyps  History of present illness:                            Patient presents for colonoscopy for a personal hx of colon polyps. The patient denies abdominal pain, abnormal weight loss or rectal bleeding.     No current facility-administered medications for this encounter.  Medications Prior to Admission  Medication Sig Dispense Refill Last Dose/Taking   acetaminophen  (TYLENOL ) 500 MG tablet Take 1,000 mg by mouth every 8 (eight) hours as needed.      albuterol  (VENTOLIN  HFA) 108 (90 Base) MCG/ACT inhaler Inhale 2 puffs into the lungs every 6 (six) hours as needed for wheezing or shortness of breath (or cough). 18 each 2    amLODipine  (NORVASC ) 5 MG tablet TAKE 1 TABLET (5 MG TOTAL) BY MOUTH DAILY. 90 tablet 3    aspirin  EC 81 MG tablet Take 1 tablet (81 mg total) by mouth daily. Swallow whole. 90 tablet 3    atorvastatin  (LIPITOR) 20 MG tablet TAKE 1 TABLET BY MOUTH EVERY DAY 90 tablet 3    fluticasone  (FLONASE ) 50 MCG/ACT nasal spray Place 2 sprays into both nostrils daily. 16 g 6    ipratropium-albuterol  (DUONEB) 0.5-2.5 (3) MG/3ML SOLN Take 3 mLs by nebulization every 4 (four) hours as needed. 360 mL 1    losartan  (COZAAR ) 100 MG tablet TAKE 1 TABLET BY MOUTH EVERY DAY 90 tablet 3    metoprolol  tartrate (LOPRESSOR ) 25 MG tablet TAKE 1 TABLET BY MOUTH TWICE A DAY 180 tablet 3    omeprazole  (PRILOSEC) 20 MG capsule TAKE 1 CAPSULE BY MOUTH EVERY DAY 90 capsule 3    Tiotropium Bromide  Monohydrate (SPIRIVA  RESPIMAT) 2.5 MCG/ACT AERS Inhale 2 puffs into the lungs daily. 4 g 5      Allergies  Allergen Reactions   Celebrex [Celecoxib] Rash    Rash. Upset stomach   Lodine [Etodolac] Rash    Rash. Upset stomach   Penicillins Rash    Has patient had a PCN reaction causing immediate rash, facial/tongue/throat  swelling, SOB or lightheadedness with hypotension: Yes Has patient had a PCN reaction causing severe rash involving mucus membranes or skin necrosis: No Has patient had a PCN reaction that required hospitalization: No Has patient had a PCN reaction occurring within the last 10 years: No If all of the above answers are NO, then may proceed with Cephalosporin use.     Past Medical History:  Diagnosis Date   AAA (abdominal aortic aneurysm) (HCC)    Brain aneurysm 1999   [facility]   GERD (gastroesophageal reflux disease)    Hyperlipidemia    Hypertension    Hyperthyroidism    Rheumatoid arthritis (HCC) 2002   s/p methotrexate, humira and embrel   Thyroid  nodule    Ulcer of abdomen wall (HCC)    Vitamin D  deficiency     Review of systems:  Otherwise negative.    Physical Exam  Gen: Alert, oriented. Appears stated age.  HEENT: [place]/AT. PERRLA. Lungs: CTA, no wheezes. CV: RR nl S1, S2. Abd: soft, benign, no masses. BS+ Ext: No edema. Pulses 2+    Planned procedures: Proceed with colonoscopy. The patient understands the nature of the planned procedure, indications, risks, alternatives and potential complications including but not limited to bleeding, infection, perforation,  damage to internal organs and possible oversedation/side effects from anesthesia. The patient agrees and gives consent to proceed.  Please refer to procedure notes for findings, recommendations and patient disposition/instructions.     Aman Batley K. Aundria, M.D. Gastroenterology 03/16/2024  1:33 PM

## 2024-03-16 NOTE — Interval H&P Note (Signed)
 History and Physical Interval Note:  03/16/2024 2:54 PM  Brooke Murphy  has presented today for surgery, with the diagnosis of History of colon polyps (Z86.0100).  The various methods of treatment have been discussed with the patient and family. After consideration of risks, benefits and other options for treatment, the patient has consented to  Procedure(s): COLONOSCOPY (N/A) as a surgical intervention.  The patient's history has been reviewed, patient examined, no change in status, stable for surgery.  I have reviewed the patient's chart and labs.  Questions were answered to the patient's satisfaction.     [place], Darcy Barbara

## 2024-03-16 NOTE — Op Note (Signed)
 [facility] Patient Name: Brooke Murphy Procedure Date: 03/16/2024 3:21 PM MRN: [id number] Account #: [id number] Date of Birth: [birth year] Admit Type: Outpatient Age: 63 Room: [facility] ENDO ROOM 1 Gender: Female Note Status: Finalized Instrument Name: Arvis [id number] Procedure:             Colonoscopy Indications:           High risk colon cancer surveillance: Personal history                         of colonic polyps Providers:             Areli Frary K. Aundria MD, MD Referring MD:          [name] (Referring MD) Medicines:             Propofol  per Anesthesia Complications:         No immediate complications. Procedure:             Pre-Anesthesia Assessment:                        - The risks and benefits of the procedure and the                         sedation options and risks were discussed with the                         patient. All questions were answered and informed                         consent was obtained.                        - Patient identification and proposed procedure were                         verified prior to the procedure by the nurse. The                         procedure was verified in the procedure room.                        - ASA Grade Assessment: III - A patient with severe                         systemic disease.                        - After reviewing the risks and benefits, the patient                         was deemed in satisfactory condition to undergo the                         procedure.                        After obtaining informed consent, the colonoscope was                         passed under direct  vision. Throughout the procedure,                         the patient's blood pressure, pulse, and oxygen                         saturations were monitored continuously. The                         Colonoscope was introduced through the anus and                         advanced to the  the cecum, identified by appendiceal                         orifice and ileocecal valve. The colonoscopy was                         performed without difficulty. The patient tolerated                         the procedure well. The quality of the bowel                         preparation was good. The ileocecal valve, appendiceal                         orifice, and rectum were photographed. Findings:      The perianal and digital rectal examinations were normal. Pertinent       negatives include normal sphincter tone and no palpable rectal lesions.      Non-bleeding internal hemorrhoids were found during retroflexion. The       hemorrhoids were Grade I (internal hemorrhoids that do not prolapse).      Many small-mouthed diverticula were found in the sigmoid colon.      Three sessile polyps were found in the rectum. The polyps were       diminutive in size. These polyps were removed with a jumbo cold forceps.       Resection and retrieval were complete. Estimated blood loss was minimal.      The exam was otherwise without abnormality. Impression:            - Non-bleeding internal hemorrhoids.                        - Diverticulosis in the sigmoid colon.                        - Three diminutive polyps in the rectum, removed with                         a jumbo cold forceps. Resected and retrieved.                        - The examination was otherwise normal. Recommendation:        - Patient has a contact number available for                         emergencies. The signs and symptoms of potential  delayed complications were discussed with the patient.                         Return to normal activities tomorrow. Written                         discharge instructions were provided to the patient.                        - Resume previous diet.                        - Continue present medications.                        - Repeat colonoscopy is recommended for  surveillance.                         The colonoscopy date will be determined after                         pathology results from today's exam become available                         for review.                        - Return to GI office PRN.                        - The findings and recommendations were discussed with                         the patient. Procedure Code(s):     --- Professional ---                        [id number], Colonoscopy, flexible; with biopsy, single or                         multiple Diagnosis Code(s):     --- Professional ---                        K57.30, Diverticulosis of large intestine without                         perforation or abscess without bleeding                        D12.8, Benign neoplasm of rectum                        K64.0, First degree hemorrhoids                        Z86.010, Personal history of colonic polyps CPT copyright 2022 American [employer] Association. All rights reserved. The codes documented in this report are preliminary and upon coder review may  be revised to meet current compliance requirements. [name] MD, MD 03/16/2024 3:34:38 PM This report has been signed electronically. Number of Addenda: 0 Note Initiated On: 03/16/2024 3:21 PM Scope Withdrawal Time: 0  hours 4 minutes 12 seconds  Total Procedure Duration: 0 hours 7 minutes 32 seconds  Estimated Blood Loss:  Estimated blood loss was minimal.      [facility]

## 2024-03-17 ENCOUNTER — Encounter: Payer: Self-pay | Admitting: Internal Medicine

## 2024-03-17 LAB — SURGICAL PATHOLOGY

## 2024-03-18 ENCOUNTER — Ambulatory Visit: Admitting: Family

## 2024-03-20 DIAGNOSIS — I10 Essential (primary) hypertension: Secondary | ICD-10-CM | POA: Diagnosis not present

## 2024-03-20 DIAGNOSIS — E785 Hyperlipidemia, unspecified: Secondary | ICD-10-CM | POA: Diagnosis not present

## 2024-03-20 DIAGNOSIS — Z880 Allergy status to penicillin: Secondary | ICD-10-CM | POA: Diagnosis not present

## 2024-03-20 DIAGNOSIS — K219 Gastro-esophageal reflux disease without esophagitis: Secondary | ICD-10-CM | POA: Diagnosis not present

## 2024-03-20 DIAGNOSIS — E039 Hypothyroidism, unspecified: Secondary | ICD-10-CM | POA: Diagnosis not present

## 2024-03-20 DIAGNOSIS — Z8249 Family history of ischemic heart disease and other diseases of the circulatory system: Secondary | ICD-10-CM | POA: Diagnosis not present

## 2024-03-20 DIAGNOSIS — Z7982 Long term (current) use of aspirin: Secondary | ICD-10-CM | POA: Diagnosis not present

## 2024-03-20 DIAGNOSIS — I700 Atherosclerosis of aorta: Secondary | ICD-10-CM | POA: Diagnosis not present

## 2024-03-20 DIAGNOSIS — Z833 Family history of diabetes mellitus: Secondary | ICD-10-CM | POA: Diagnosis not present

## 2024-03-20 DIAGNOSIS — M545 Low back pain, unspecified: Secondary | ICD-10-CM | POA: Diagnosis not present

## 2024-03-20 DIAGNOSIS — F325 Major depressive disorder, single episode, in full remission: Secondary | ICD-10-CM | POA: Diagnosis not present

## 2024-03-20 DIAGNOSIS — Z823 Family history of stroke: Secondary | ICD-10-CM | POA: Diagnosis not present

## 2024-04-30 DIAGNOSIS — E0520 Thyrotoxicosis with toxic multinodular goiter without thyrotoxic crisis or storm: Secondary | ICD-10-CM | POA: Diagnosis not present

## 2024-04-30 DIAGNOSIS — Z1331 Encounter for screening for depression: Secondary | ICD-10-CM | POA: Diagnosis not present

## 2024-06-28 DIAGNOSIS — E0520 Thyrotoxicosis with toxic multinodular goiter without thyrotoxic crisis or storm: Secondary | ICD-10-CM | POA: Diagnosis not present

## 2024-06-30 ENCOUNTER — Other Ambulatory Visit: Payer: Self-pay

## 2024-06-30 ENCOUNTER — Emergency Department

## 2024-06-30 ENCOUNTER — Emergency Department
Admission: EM | Admit: 2024-06-30 | Discharge: 2024-06-30 | Disposition: A | Discharge status: Home / Self Care | Attending: Emergency Medicine | Admitting: Emergency Medicine

## 2024-06-30 DIAGNOSIS — G43109 Migraine with aura, not intractable, without status migrainosus: Secondary | ICD-10-CM | POA: Diagnosis not present

## 2024-06-30 DIAGNOSIS — R519 Headache, unspecified: Secondary | ICD-10-CM | POA: Insufficient documentation

## 2024-06-30 DIAGNOSIS — I671 Cerebral aneurysm, nonruptured: Secondary | ICD-10-CM | POA: Diagnosis not present

## 2024-06-30 DIAGNOSIS — I6502 Occlusion and stenosis of left vertebral artery: Secondary | ICD-10-CM | POA: Diagnosis not present

## 2024-06-30 DIAGNOSIS — R42 Dizziness and giddiness: Secondary | ICD-10-CM | POA: Insufficient documentation

## 2024-06-30 DIAGNOSIS — R200 Anesthesia of skin: Secondary | ICD-10-CM | POA: Insufficient documentation

## 2024-06-30 DIAGNOSIS — I10 Essential (primary) hypertension: Secondary | ICD-10-CM | POA: Diagnosis not present

## 2024-06-30 LAB — COMPREHENSIVE METABOLIC PANEL WITH GFR
ALT: 12 U/L (ref 0–44)
AST: 29 U/L (ref 15–41)
Albumin: 4.3 g/dL (ref 3.5–5.0)
Alkaline Phosphatase: 61 U/L (ref 38–126)
Anion gap: 12 (ref 5–15)
BUN: 15 mg/dL (ref 8–23)
CO2: 24 mmol/L (ref 22–32)
Calcium: 9.6 mg/dL (ref 8.9–10.3)
Chloride: 99 mmol/L (ref 98–111)
Creatinine, Ser: 0.77 mg/dL (ref 0.44–1.00)
GFR, Estimated: >60 mL/min (ref >60)
Glucose, Bld: 113 mg/dL — ABNORMAL HIGH (ref 70–99)
Potassium: 4 mmol/L (ref 3.5–5.1)
Sodium: 135 mmol/L (ref 135–145)
Total Bilirubin: 0.7 mg/dL (ref 0.0–1.2)
Total Protein: 7.8 g/dL (ref 6.5–8.1)

## 2024-06-30 LAB — URINE DRUG SCREEN, QUALITATIVE (ARMC ONLY)
Amphetamines, Ur Screen: NOT DETECTED
Barbiturates, Ur Screen: NOT DETECTED
Benzodiazepine, Ur Scrn: NOT DETECTED
Cannabinoid 50 Ng, Ur ~~LOC~~: NOT DETECTED
Cocaine Metabolite,Ur ~~LOC~~: NOT DETECTED
MDMA (Ecstasy)Ur Screen: NOT DETECTED
Methadone Scn, Ur: NOT DETECTED
Opiate, Ur Screen: NOT DETECTED
Phencyclidine (PCP) Ur S: NOT DETECTED
Tricyclic, Ur Screen: NOT DETECTED

## 2024-06-30 LAB — CBC WITH DIFFERENTIAL/PLATELET
Abs Immature Granulocytes: 0.02 K/uL (ref 0.00–0.07)
Basophils Absolute: 0.1 K/uL (ref 0.0–0.1)
Basophils Relative: 1 %
Eosinophils Absolute: 0.1 K/uL (ref 0.0–0.5)
Eosinophils Relative: 1 %
HCT: 39.7 % (ref 36.0–46.0)
Hemoglobin: 13.3 g/dL (ref 12.0–15.0)
Immature Granulocytes: 0 %
Lymphocytes Relative: 39 %
Lymphs Abs: 2.5 K/uL (ref 0.7–4.0)
MCH: 31.5 pg (ref 26.0–34.0)
MCHC: 33.5 g/dL (ref 30.0–36.0)
MCV: 94.1 fL (ref 80.0–100.0)
Monocytes Absolute: 0.5 K/uL (ref 0.1–1.0)
Monocytes Relative: 8 %
Neutro Abs: 3.2 K/uL (ref 1.7–7.7)
Neutrophils Relative %: 51 %
Platelets: 309 K/uL (ref 150–400)
RBC: 4.22 MIL/uL (ref 3.87–5.11)
RDW: 14.7 % (ref 11.5–15.5)
WBC: 6.3 K/uL (ref 4.0–10.5)
nRBC: 0 % (ref 0.0–0.2)

## 2024-06-30 LAB — CBG MONITORING, ED: Glucose-Capillary: 127 mg/dL — ABNORMAL HIGH (ref 70–99)

## 2024-06-30 LAB — APTT: aPTT: 27 s (ref 24–36)

## 2024-06-30 LAB — PROTIME-INR
INR: 0.9 (ref 0.8–1.2)
Prothrombin Time: 12.6 s (ref 11.4–15.2)

## 2024-06-30 LAB — ETHANOL: Alcohol, Ethyl (B): <15 mg/dL (ref <15)

## 2024-06-30 MED ORDER — SODIUM CHLORIDE 0.9 % IV BOLUS
500.0000 mL | Freq: Once | INTRAVENOUS | Status: AC
  Administered 2024-06-30: 500 mL via INTRAVENOUS

## 2024-06-30 MED ORDER — PROCHLORPERAZINE EDISYLATE 10 MG/2ML IJ SOLN
10.0000 mg | Freq: Once | INTRAMUSCULAR | Status: AC
  Administered 2024-06-30: 10 mg via INTRAVENOUS
  Filled 2024-06-30: qty 2

## 2024-06-30 MED ORDER — IOHEXOL 350 MG/ML SOLN
75.0000 mL | Freq: Once | INTRAVENOUS | Status: AC | PRN
  Administered 2024-06-30: 75 mL via INTRAVENOUS

## 2024-06-30 MED ORDER — ACETAMINOPHEN 500 MG PO TABS
1000.0000 mg | ORAL_TABLET | Freq: Once | ORAL | Status: AC
  Administered 2024-06-30: 1000 mg via ORAL
  Filled 2024-06-30: qty 2

## 2024-06-30 NOTE — ED Provider Notes (Signed)
 [facility] Provider Note    Event Date/Time   First MD Initiated Contact with Patient 06/30/24 1215     (approximate)   History   Headache   HPI  Brooke Murphy is a 63 y.o. female past medical history significant for aneurysm with prior clipping, who presents to the emergency department with numbness and dizziness.  Patient states that she was in her normal state of health yesterday and this morning.  Around 9:00 she started to feel off.  States that she started having a mild headache.  Feeling mildly confused with some dizziness and feeling slightly off balance.  Also complaining of some numbness to her left face.  Denies any recent falls or head trauma.  Not on anticoagulation.  Denies any chest pain or shortness of breath.  Followed by [facility] neurosurgery for prior aneurysm.  On chart review patient had another coil embolization approximately 1 year ago for an incidental finding of an aneurysm.     Physical Exam   Triage Vital Signs: ED Triage Vitals  Encounter Vitals Group     BP 06/30/24 1205 (!) 144/86     Girls Systolic BP Percentile --      Girls Diastolic BP Percentile --      Boys Systolic BP Percentile --      Boys Diastolic BP Percentile --      Pulse Rate 06/30/24 1205 74     Resp 06/30/24 1205 20     Temp 06/30/24 1205 98.2 F (36.8 C)     Temp Source 06/30/24 1205 Oral     SpO2 06/30/24 1205 95 %     Weight 06/30/24 1204 130 lb (59 kg)     Height 06/30/24 1204 5' 5 (1.651 m)     Head Circumference --      Peak Flow --      Pain Score 06/30/24 1204 6     Pain Loc --      Pain Education --      Exclude from Growth Chart --     Most recent vital signs: Vitals:   06/30/24 1330 06/30/24 1400  BP: (!) 158/86 (!) 145/86  Pulse: 77 65  Resp: 20 17  Temp: 98.3 F (36.8 C)   SpO2: 94% 98%    Physical Exam Constitutional:      Appearance: She is well-developed.  HENT:     Head: Atraumatic.  Eyes:     Conjunctiva/sclera:  Conjunctivae normal.  Cardiovascular:     Rate and Rhythm: Regular rhythm.  Pulmonary:     Effort: No respiratory distress.  Abdominal:     General: There is no distension.  Musculoskeletal:        General: Normal range of motion.     Cervical back: Normal range of motion.  Skin:    General: Skin is warm.  Neurological:     Mental Status: She is alert. Mental status is at baseline.     GCS: GCS eye subscore is 4. GCS verbal subscore is 5. GCS motor subscore is 6.     Cranial Nerves: Cranial nerves 2-12 are intact.     Sensory: Sensory deficit present.     Motor: Motor function is intact.     Coordination: Coordination is intact.     Gait: Gait is intact.     Comments: Decree sensation to V1 and V2 distribution on the left side of the face     IMPRESSION / MDM / ASSESSMENT AND PLAN /  ED COURSE  I reviewed the triage vital signs and the nursing notes.  Patient was made activated code stroke, within the window of TNK.  Last known well at 9 AM.  Immediately taken back to CT scanner for CT head CTA  Dr. Michaela with neurology was at bedside   EKG  I, Clotilda Punter, the attending physician, personally viewed and interpreted this ECG.   Rate: Normal  Rhythm: Normal sinus  Axis: Normal  Intervals: Normal  ST&T Change: None  No tachycardic or bradycardic dysrhythmias while on cardiac telemetry.  RADIOLOGY I independently reviewed imaging, my interpretation of imaging: CT scan of the head with no signs of intracranial hemorrhage  CTA with no signs of large vessel occluded.  Treated distal aneurysms with no adverse features.  Stable stenosis.  LABS (all labs ordered are listed, but only abnormal results are displayed) Labs interpreted as -    Labs Reviewed  COMPREHENSIVE METABOLIC PANEL WITH GFR - Abnormal; Notable for the following components:      Result Value   Glucose, Bld 113 (*)    All other components within normal limits  CBG MONITORING, ED - Abnormal;  Notable for the following components:   Glucose-Capillary 127 (*)    All other components within normal limits  ETHANOL  PROTIME-INR  APTT  URINE DRUG SCREEN, QUALITATIVE ([facility] ONLY)  CBC WITH DIFFERENTIAL/PLATELET     MDM    Lab work overall reassuring.  CT scan of the head was negative.  After discussion with Dr. 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  Recommended migraine treatment  After migraine treatment on reevaluation patient states that she is feeling much better and her symptoms have resolved.  Plan to discharge home with outpatient follow-up with neurology.  Discussed return precautions for any return or worsening symptoms.  No questions at time of discharge.   PROCEDURES:  Critical Care performed: yes  .Critical Care  Performed by: Punter Clotilda, MD Authorized by: Punter Clotilda, MD   Critical care provider statement:    Critical care time (minutes):  45   Critical care time was exclusive of:  Separately billable procedures and treating other patients   Critical care was necessary to treat or prevent imminent or life-threatening deterioration of the following conditions:  CNS failure or compromise   Critical care was time spent personally by me on the following activities:  Development of treatment plan with patient or surrogate, discussions with consultants, evaluation of patient's response to treatment, examination of patient, ordering and review of laboratory studies, ordering and review of radiographic studies, ordering and performing treatments and interventions, pulse oximetry, re-evaluation of patient's condition and review of old charts   Patient's presentation is most consistent with acute presentation with potential threat to life or bodily function.   MEDICATIONS ORDERED IN ED: Medications  iohexol  (OMNIPAQUE ) 350 MG/ML injection 75 mL (75 mLs Intravenous Contrast Given 06/30/24 1235)  sodium  chloride 0.9 % bolus 500 mL (0 mLs Intravenous Stopped 06/30/24 1358)  acetaminophen  (TYLENOL ) tablet 1,000 mg (1,000 mg Oral Given 06/30/24 1301)  prochlorperazine (COMPAZINE) injection 10 mg (10 mg Intravenous Given 06/30/24 1302)    FINAL CLINICAL IMPRESSION(S) / ED DIAGNOSES   Final diagnoses:  Nonintractable headache, unspecified chronicity pattern, unspecified headache type  Numbness     Rx / DC Orders   ED Discharge Orders     None        Note:  This document was  prepared using Conservation officer, historic buildings and may include unintentional dictation errors.   Suzanne Kirsch, MD 06/30/24 [phone]

## 2024-06-30 NOTE — ED Notes (Signed)
 Code stroke called by Dr. Suzanne

## 2024-06-30 NOTE — Progress Notes (Signed)
   06/30/24 1215  Spiritual Encounters  Type of Visit Initial  Care provided to: Pt and family  Conversation partners present during encounter Nurse  Referral source Code page  Reason for visit Code  OnCall Visit Yes   Chaplain responded to a Code Stroke but patient was in CT and no family was present.  Chaplain waited a bit and came back to the room to find the patient and partner in the room.  Chaplain offered a compassionate presence and reflective listening.  Doctor came into the room as well while Chaplain was visiting.  Patient and partner had no other spiritual care needs so, Chaplain asked them to page, if needed.    Rev. Rana M. Nicholaus, M.Div. Chaplain Resident [facility]

## 2024-06-30 NOTE — ED Notes (Signed)
 Dr. Suzanne in triage to evaluate patient.

## 2024-06-30 NOTE — ED Notes (Signed)
 Patient to CT with RN

## 2024-06-30 NOTE — ED Notes (Signed)
 Initiated code stroke to [facility]  1215

## 2024-06-30 NOTE — Code Documentation (Signed)
 Stroke Response Nurse Documentation Code Documentation  Zahara Rembert is a 63 y.o. female arriving to [facility] via Private Vehicle on 06/30/2024 with past medical hx of brain aneurysm, HTN, ulcer of abdomen wall, rheumatoid arthritis, AAA, HLD, aneurysm with prior clipping. On aspirin  81 mg daily. Code stroke was activated by ED.   Patient from home where she was LKW at 0900 and now complaining of headache, left facial numbness/tingling. She was in her normal state of health until 0900 when she began having a headache, confusion, and left facial numbness. Endorses dizziness at time of exam.   Stroke team at the bedside after activation. Labs drawn and patient cleared for CT by Dr. Suzanne. Patient to CT with team. NIHSS 1, see documentation for details and code stroke times. Patient with left decreased sensation on exam. The following imaging was completed:  CT Head and CTA. Patient is not a candidate for IV Thrombolytic due to too mild to treat, per MD.   Care Plan: every 30 min NIHSS until patient outside thrombolytic window at 1330. Reactivate if symptoms worsen in thrombolytic window. Swallow screen per order.   Process Delays Noted: n/a  Bedside handoff with ED RN Rosina IVAR Burnard KANDICE Hershel  Stroke Response RN

## 2024-06-30 NOTE — ED Notes (Signed)
 Patient to CT at this time by Eleanor, RN.

## 2024-06-30 NOTE — ED Triage Notes (Signed)
 Patient states headache that started about 30-45 minutes ago; feels similar to headaches previously.

## 2024-06-30 NOTE — Progress Notes (Signed)
 CODE STROKE- PHARMACY COMMUNICATION   Time CODE STROKE called/page received: 1217  Time response to CODE STROKE was made (in person or via phone): 1220  Time Stroke Kit retrieved from Pyxis (only if needed): TNK not administered   Name of Provider/Nurse contacted: Dr. Michaela   Past Medical History:  Diagnosis Date   AAA (abdominal aortic aneurysm)    Brain aneurysm 1999   [facility]   GERD (gastroesophageal reflux disease)    Hyperlipidemia    Hypertension    Hyperthyroidism    Rheumatoid arthritis (HCC) 2002   s/p methotrexate, humira and embrel   Thyroid  nodule    Ulcer of abdomen wall    Vitamin D  deficiency    Prior to Admission medications   Medication Sig Start Date End Date Taking? Authorizing Provider  acetaminophen  (TYLENOL ) 500 MG tablet Take 1,000 mg by mouth every 8 (eight) hours as needed.    [provider]  albuterol  (VENTOLIN  HFA) 108 (90 Base) MCG/ACT inhaler Inhale 2 puffs into the lungs every 6 (six) hours as needed for wheezing or shortness of breath (or cough). 01/02/23   Tamea Dedra CROME, MD  amLODipine  (NORVASC ) 5 MG tablet TAKE 1 TABLET (5 MG TOTAL) BY MOUTH DAILY. 12/01/23   Dineen Rollene MATSU, FNP  aspirin  EC 81 MG tablet Take 1 tablet (81 mg total) by mouth daily. Swallow whole. 05/31/22   Darliss Rogue, MD  atorvastatin  (LIPITOR) 20 MG tablet TAKE 1 TABLET BY MOUTH EVERY DAY 12/01/23   Dineen Rollene MATSU, FNP  fluticasone  (FLONASE ) 50 MCG/ACT nasal spray Place 2 sprays into both nostrils daily. 04/26/14   Rey, Lilia HERO, NP  ipratropium-albuterol  (DUONEB) 0.5-2.5 (3) MG/3ML SOLN Take 3 mLs by nebulization every 4 (four) hours as needed. 09/20/18   Fisher, Devere ORN, PA-C  losartan  (COZAAR ) 100 MG tablet TAKE 1 TABLET BY MOUTH EVERY DAY 12/01/23   Dineen Rollene MATSU, FNP  metoprolol  tartrate (LOPRESSOR ) 25 MG tablet TAKE 1 TABLET BY MOUTH TWICE A DAY 12/01/23   Dineen Rollene MATSU, FNP  omeprazole  (PRILOSEC) 20 MG capsule TAKE 1 CAPSULE BY MOUTH EVERY  DAY 12/01/23   Dineen Rollene MATSU, FNP  Tiotropium Bromide  Monohydrate (SPIRIVA  RESPIMAT) 2.5 MCG/ACT AERS Inhale 2 puffs into the lungs daily. 11/26/23   Tamea Dedra CROME, MD    Estill HERO Lutes, PharmD, BCPS Clinical Pharmacist 06/30/2024 12:35 PM

## 2024-06-30 NOTE — Discharge Instructions (Signed)
 You are seen in the emergency department for headache, dizziness and tingling sensation to your left face.  You had a CT scan that did not show any new aneurysms or blood on your brain.  Your symptoms improved with migraine treatment.  Follow-up closely as an outpatient with your primary care physician and your neurology team.  Return to the emergency department for any new or concerning symptoms.  Stay hydrated and drink plenty of fluids.

## 2024-06-30 NOTE — Consult Note (Signed)
 NEUROLOGY CONSULT NOTE   Date of service: June 30, 2024 Patient Name: Brooke Murphy MRN:  [id number] DOB:  1960-10-04 Chief Complaint: left facial tingling and numbness Requesting Provider: Suzanne Kirsch, MD  History of Present Illness  Brooke Murphy is a 63 y.o. female with hx of aneurysm rupture in 1999 with treatment of a separate aneurysm coil embolization last year who presents with left facial tingling, headache.   LKW: 9 AM Modified rankin score: 0-Completely asymptomatic and back to baseline post- stroke IV Thrombolysis: No, mild symptoms EVT: No, mild symptoms  NIHSS components Score: Comment  1a Level of Conscious 0[]  1[]  2[]  3[]      1b LOC Questions 0[]  1[]  2[]       1c LOC Commands 0[]  1[]  2[]       2 Best Gaze 0[]  1[]  2[]       3 Visual 0[]  1[]  2[]  3[]      4 Facial Palsy 0[]  1[]  2[]  3[]      5a Motor Arm - left 0[]  1[]  2[]  3[]  4[]  UN[]    5b Motor Arm - Right 0[]  1[]  2[]  3[]  4[]  UN[]    6a Motor Leg - Left 0[]  1[]  2[]  3[]  4[]  UN[]    6b Motor Leg - Right 0[]  1[]  2[]  3[]  4[]  UN[]    7 Limb Ataxia 0[]  1[]  2[]  UN[]      8 Sensory 0[]  1[x]  2[]  UN[]      9 Best Language 0[]  1[]  2[]  3[]      10 Dysarthria 0[]  1[]  2[]  UN[]      11 Extinct. and Inattention 0[]  1[]  2[]       TOTAL: 1       Past History   Past Medical History:  Diagnosis Date   AAA (abdominal aortic aneurysm)    Brain aneurysm 1999   [facility]   GERD (gastroesophageal reflux disease)    Hyperlipidemia    Hypertension    Hyperthyroidism    Rheumatoid arthritis (HCC) 2002   s/p methotrexate, humira and embrel   Thyroid  nodule    Ulcer of abdomen wall    Vitamin D  deficiency     Past Surgical History:  Procedure Laterality Date   ABDOMINAL HYSTERECTOMY  2005   ovaries remain; WITH CERVIX   BRAIN SURGERY Left 11/27/1997   aneurysm   coil embolization     COLONOSCOPY     COLONOSCOPY N/A 03/16/2024   Procedure: COLONOSCOPY;  Surgeon: Toledo, Ladell POUR, MD;  Location: [facility] ENDOSCOPY;  Service:  Gastroenterology;  Laterality: N/A;   LAPAROSCOPIC BILATERAL SALPINGO OOPHERECTOMY  11/11/2018   Procedure: LAPAROSCOPIC BILATERAL SALPINGO OOPHORECTOMY;  Surgeon: Verdon Keen, MD;  Location: [facility] ORS;  Service: Gynecology;;    Family History: Family History  Problem Relation Age of Onset   Hypertension Mother    Aneurysm Mother 95       died   Hyperlipidemia Sister    Hypertension Sister    Cancer Father 22       throat cancer   Diabetes Maternal Aunt    Breast cancer Maternal Aunt 65   Hypertension Maternal Grandmother    Stroke Maternal Grandmother    Colon cancer Neg Hx     Social History  reports that she quit smoking about 2 years ago. Her smoking use included cigarettes. She started smoking about 39 years ago. She has a 37 pack-year smoking history. She has never used smokeless tobacco. She reports that she does not drink alcohol and does not use drugs.  Allergies  Allergen Reactions   Celebrex [Celecoxib] Rash  Rash. Upset stomach   Lodine [Etodolac] Rash    Rash. Upset stomach   Penicillins Rash    Has patient had a PCN reaction causing immediate rash, facial/tongue/throat swelling, SOB or lightheadedness with hypotension: Yes Has patient had a PCN reaction causing severe rash involving mucus membranes or skin necrosis: No Has patient had a PCN reaction that required hospitalization: No Has patient had a PCN reaction occurring within the last 10 years: No If all of the above answers are NO, then may proceed with Cephalosporin use.    Medications  No current facility-administered medications for this encounter.  Current Outpatient Medications:    acetaminophen  (TYLENOL ) 500 MG tablet, Take 1,000 mg by mouth every 8 (eight) hours as needed., Disp: , Rfl:    albuterol  (VENTOLIN  HFA) 108 (90 Base) MCG/ACT inhaler, Inhale 2 puffs into the lungs every 6 (six) hours as needed for wheezing or shortness of breath (or cough)., Disp: 18 each, Rfl: 2   amLODipine   (NORVASC ) 5 MG tablet, TAKE 1 TABLET (5 MG TOTAL) BY MOUTH DAILY., Disp: 90 tablet, Rfl: 3   aspirin  EC 81 MG tablet, Take 1 tablet (81 mg total) by mouth daily. Swallow whole., Disp: 90 tablet, Rfl: 3   atorvastatin  (LIPITOR) 20 MG tablet, TAKE 1 TABLET BY MOUTH EVERY DAY, Disp: 90 tablet, Rfl: 3   fluticasone  (FLONASE ) 50 MCG/ACT nasal spray, Place 2 sprays into both nostrils daily., Disp: 16 g, Rfl: 6   ipratropium-albuterol  (DUONEB) 0.5-2.5 (3) MG/3ML SOLN, Take 3 mLs by nebulization every 4 (four) hours as needed., Disp: 360 mL, Rfl: 1   losartan  (COZAAR ) 100 MG tablet, TAKE 1 TABLET BY MOUTH EVERY DAY, Disp: 90 tablet, Rfl: 3   metoprolol  tartrate (LOPRESSOR ) 25 MG tablet, TAKE 1 TABLET BY MOUTH TWICE A DAY, Disp: 180 tablet, Rfl: 3   omeprazole  (PRILOSEC) 20 MG capsule, TAKE 1 CAPSULE BY MOUTH EVERY DAY, Disp: 90 capsule, Rfl: 3   Tiotropium Bromide  Monohydrate (SPIRIVA  RESPIMAT) 2.5 MCG/ACT AERS, Inhale 2 puffs into the lungs daily., Disp: 4 g, Rfl: 5  Vitals   Vitals:   06/30/24 1204 06/30/24 1205  BP:  (!) 144/86  Pulse:  74  Resp:  20  Temp:  98.2 F (36.8 C)  TempSrc:  Oral  SpO2:  95%  Weight: 59 kg   Height: 5' 5 (1.651 m)     Body mass index is 21.63 kg/m.   Physical Exam   Constitutional: Appears well-developed and well-nourished.   Neurologic Examination    Neuro: Mental Status: Patient is awake, alert, oriented to person, place, month, year, and situation. Patient is able to give a clear and coherent history. No signs of aphasia or neglect Cranial Nerves: II: Visual Fields are full. Pupils are equal, round, and reactive to light.   III,IV, VI: EOMI without ptosis or diploplia.  V: Facial sensation is diminished on the left VII: Facial movement is symmetric.  VIII: hearing is intact to voice X: Uvula elevates symmetrically XII: tongue is midline without atrophy or fasciculations.  Motor: Tone is normal. Bulk is normal. 5/5 strength was present in all  four extremities.  Sensory: Sensation is symmetric to light touch and temperature in the arms and legs. Cerebellar: FNF and HKS are intact bilaterally        Labs/Imaging/Neurodiagnostic studies   CBC: No results for input(s): WBC, NEUTROABS, HGB, HCT, MCV, PLT in the last 168 hours. Basic Metabolic Panel:  Lab Results  Component Value Date   NA 135  01/19/2024   K 4.2 01/19/2024   CO2 26 01/19/2024   GLUCOSE 99 01/19/2024   BUN 13 01/19/2024   CREATININE 0.67 01/19/2024   CALCIUM  9.8 01/19/2024   GFRNONAA >60 12/23/2022   GFRAA >60 11/04/2018   Lipid Panel:  Lab Results  Component Value Date   LDLCALC 65 01/19/2024   HgbA1c:  Lab Results  Component Value Date   HGBA1C 6.4 01/19/2024   Urine Drug Screen: No results found for: LABOPIA, COCAINSCRNUR, LABBENZ, AMPHETMU, THCU, LABBARB  Alcohol Level No results found for: [facility] INR  Lab Results  Component Value Date   INR 0.97 11/04/2018   APTT  Lab Results  Component Value Date   APTT 31 11/04/2018   CT angio Head and Neck with contrast(Personally reviewed): No acute IC findings, no new aneurysm.   ASSESSMENT   Brooke Murphy is a 63 y.o. female with history of previous aneurysmal rupture as well as a history of complicated migraines when she was younger who presents with left-sided facial tingling.  In the setting of head pressure, and a history of an identical previous presentation, and the presence of positive symptoms, I think that the most likely diagnosis by far is complicated migraine.  I would favor treating this as migraine and seeing if it improves.  She did have an aneurysm found on the left with her previous experience, but this was likely an incidental finding and unrelated to that presentation.  I suspect that her current presentation is also a complicated migraine.  RECOMMENDATIONS  Treat as migraine, if symptoms improve then no further treatment is necessary If her symptoms  do not improve, may need to consider admission for repeat head CT tomorrow ______________________________________________________________________    Signed, Aisha Seals, MD [facility]

## 2024-07-05 ENCOUNTER — Ambulatory Visit: Admitting: Family

## 2024-07-05 ENCOUNTER — Encounter: Payer: Self-pay | Admitting: Family

## 2024-07-05 VITALS — BP 130/78 | HR 95 | Temp 97.7°F | Ht 65.0 in | Wt 126.0 lb

## 2024-07-05 DIAGNOSIS — R7989 Other specified abnormal findings of blood chemistry: Secondary | ICD-10-CM

## 2024-07-05 DIAGNOSIS — I700 Atherosclerosis of aorta: Secondary | ICD-10-CM | POA: Diagnosis not present

## 2024-07-05 DIAGNOSIS — I671 Cerebral aneurysm, nonruptured: Secondary | ICD-10-CM | POA: Diagnosis not present

## 2024-07-05 DIAGNOSIS — F32A Depression, unspecified: Secondary | ICD-10-CM

## 2024-07-05 DIAGNOSIS — R7303 Prediabetes: Secondary | ICD-10-CM | POA: Diagnosis not present

## 2024-07-05 DIAGNOSIS — I10 Essential (primary) hypertension: Secondary | ICD-10-CM | POA: Diagnosis not present

## 2024-07-05 LAB — B12 AND FOLATE PANEL
Folate: 9.9 ng/mL (ref >5.9)
Vitamin B-12: 1141 pg/mL — ABNORMAL HIGH (ref 211–911)

## 2024-07-05 LAB — MICROALBUMIN / CREATININE URINE RATIO
Creatinine,U: 48.3 mg/dL
Microalb Creat Ratio: 15.2 mg/g (ref 0.0–30.0)
Microalb, Ur: 0.7 mg/dL (ref 0.0–1.9)

## 2024-07-05 LAB — HEMOGLOBIN A1C: Hgb A1c MFr Bld: 6.3 % (ref 4.6–6.5)

## 2024-07-05 MED ORDER — AMLODIPINE BESYLATE 2.5 MG PO TABS: 2.5000 mg | ORAL_TABLET | Freq: Every day | ORAL | 2 refills | Status: DC

## 2024-07-05 MED ORDER — METOPROLOL TARTRATE 25 MG PO TABS: 12.5000 mg | ORAL_TABLET | Freq: Two times a day (BID) | ORAL | 3 refills | Status: AC

## 2024-07-05 MED ORDER — MIRTAZAPINE 15 MG PO TABS: 15.0000 mg | ORAL_TABLET | Freq: Every day | ORAL | 2 refills | Status: DC

## 2024-07-05 NOTE — Patient Instructions (Addendum)
 Decrease metoprolol  from 25mg  twice daily to 12.5mg  twice daily due to dizziness  Increase amlodipine  from 5mg  daily to 7.5mg  daily; I have sent in additional amlodipine  2.5mg  for you to take with amlodipine  5mg   Please let me know how you are doing  Please call  and schedule your 3D mammogram and /or bone density scan as we discussed.   [facility]  ( new location in 2023)  [address], [place], [place] [address]  [place], [place]  [phone]   Please make follow up with cardiology Dr Daleen as well . This is very important   [phone]

## 2024-07-05 NOTE — Progress Notes (Unsigned)
 Assessment & Plan:  Depression, unspecified depression type Assessment & Plan: Chronic, uncontrolled.  Discussed work stress.  Recently has quit her job.  Start Remeron  15mg .  Close follow-up  Orders: -     Mirtazapine ; Take 1 tablet (15 mg total) by mouth at bedtime.  Dispense: 30 tablet; Refill: 2  Elevated vitamin B12 level -     B12 and Folate Panel  Prediabetes -     Hemoglobin A1c -     Microalbumin / creatinine urine ratio  Primary hypertension Assessment & Plan: She is not orthostatic on exam. BP increased as she went from supine to standing. HR however remained on low end of normal, 60-65.  Concern metoprolol  contributing to fatigue.  Decrease metoprolol  from 25mg  twice daily to 12.5mg  twice daily. Increase amlodipine  from 5mg  daily to 7.5mg  daily to maintain blood pressure control. Close follow up.   Orders: -     Microalbumin / creatinine urine ratio  Essential hypertension -     Metoprolol  Tartrate; Take 0.5 tablets (12.5 mg total) by mouth 2 (two) times daily.  Dispense: 180 tablet; Refill: 3  Cerebral aneurysm without rupture Assessment & Plan: Chronic, stable.  Reviewed neurology consult.  Continue to follow with [facility] neurology for ongoing surveillance.   Aortic atherosclerosis Assessment & Plan: Chronic, symptomatically stable.  No return to follow-up with cardiology.  LDL less than 70.  Continue aspirin  81 mg, Lipitor 20 mg.  Reiterated the importance of follow-up with cardiology.  She will call to schedule   Other orders -     amLODIPine  Besylate; Take 1 tablet (2.5 mg total) by mouth daily. Take with amlodipine  5mg   Dispense: 30 tablet; Refill: 2     Return precautions given.   Risks, benefits, and alternatives of the medications and treatment plan prescribed today were discussed, and patient expressed understanding.   Education regarding symptom management and diagnosis given to patient on AVS either electronically or printed.  Return in about 1  week (around 07/12/2024).  [name], FNP  Subjective:    Patient ID: Jaidalyn Schillo, female    DOB: [birth year], 63 y.o.   MRN: [id number]  CC: Litisha Guagliardo is a 63 y.o. female who presents today for follow up.   HPI: HPI Discussed the use of AI scribe software for clinical note transcription with the patient, who gave verbal consent to proceed.  History of Present Illness   Haille Pardi is a 63 year old female with coronary artery disease who presents with dizziness and weight loss.  She has been experiencing dizziness and lightheadedness for the past three to four weeks, which worsened a few weeks ago. The sensation is described as 'swimming headed' rather than vertigo, occurring primarily while standing. Denies chest pain or heart palpitations during these episodes.    She has experienced weight loss, which she attributes to decreased appetite due to stress and anxiety. She quit her job due to stress this week, which she believes contributed to her symptoms. She reports poor sleep, waking up after a few hours, and would like to restart Remeron , which previously helped.  She is a former smoker and participates in a CT lung cancer screening program. She maintains some physical activity through walking and stretching. Denies CP.  Seen ED 06/30/24 [facility] for HA CT scan of the head code stroke initiated with no signs of intracranial hemorrhage   CTA with no signs of large vessel occluded.  Treated distal aneurysms with no adverse features.  Stable stenosis  Presented to the ED `0/8/25 for HA given compazine, tylenol  Consult note 06/30/24 Dr Michaela, neurology  She did have an aneurysm found on the left with her previous experience, but this was likely an incidental finding and unrelated to that presentation. I suspect that her current presentation is also a complicated migraine.    06/30/24 CT angio head and neck 1. No large vessel occlusion or hemodynamically significant stenosis  in the head or neck despite atherosclerosis. 2. Treated distal LICA and Left MCA aneurysms with no adverse features. 3. 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 4. Salient findings were communicated to Dr. Michaela at 12:47 on 06/30/2024 by text page via the [facility] messaging system. Mild to moderate right carotid atherosclerosis in the neck without stenosis. Mild left carotid atherosclerosis in the neck without stenosis.   F/u Dr Damian 06/28/24; discussed low energy Na 135 Hgb 13.8 TSH 0.138  Colonoscopy 02/23/24 Mammogram due  Consult with Dr Daleen CAD, HTN, HLD 98/2023  Echocardiogram 12/24/22 Left ventricular ejection fraction, by estimation, is 65 to 70%  CT chest with dr tamea 12/24/23  Allergies: Celebrex [celecoxib], Lodine [etodolac], and Penicillins Current Outpatient Medications on File Prior to Visit  Medication Sig Dispense Refill   acetaminophen  (TYLENOL ) 500 MG tablet Take 1,000 mg by mouth every 8 (eight) hours as needed.     albuterol  (VENTOLIN  HFA) 108 (90 Base) MCG/ACT inhaler Inhale 2 puffs into the lungs every 6 (six) hours as needed for wheezing or shortness of breath (or cough). 18 each 2   amLODipine  (NORVASC ) 5 MG tablet TAKE 1 TABLET (5 MG TOTAL) BY MOUTH DAILY. 90 tablet 3   aspirin  EC 81 MG tablet Take 1 tablet (81 mg total) by mouth daily. Swallow whole. 90 tablet 3   atorvastatin  (LIPITOR) 20 MG tablet TAKE 1 TABLET BY MOUTH EVERY DAY 90 tablet 3   fluticasone  (FLONASE ) 50 MCG/ACT nasal spray Place 2 sprays into both nostrils daily. 16 g 6   ipratropium-albuterol  (DUONEB) 0.5-2.5 (3) MG/3ML SOLN Take 3 mLs by nebulization every 4 (four) hours as needed. 360 mL 1   losartan  (COZAAR ) 100 MG tablet TAKE 1 TABLET BY MOUTH EVERY DAY 90 tablet 3   omeprazole  (PRILOSEC) 20 MG capsule TAKE 1 CAPSULE BY MOUTH EVERY DAY 90 capsule 3   Tiotropium Bromide  Monohydrate (SPIRIVA  RESPIMAT) 2.5 MCG/ACT AERS Inhale 2 puffs into  the lungs daily. 4 g 5   No current facility-administered medications on file prior to visit.    Review of Systems  Constitutional:  Positive for appetite change, fever and unexpected weight change. Negative for chills.  HENT:  Negative for congestion.   Eyes:  Negative for visual disturbance.  Respiratory:  Negative for cough.   Cardiovascular:  Negative for chest pain and palpitations.  Gastrointestinal:  Negative for nausea and vomiting.  Psychiatric/Behavioral:  Positive for sleep disturbance.       Objective:    BP 130/78   Pulse 95   Temp 97.7 F (36.5 C) (Oral)   Ht 5' 5 (1.651 m)   Wt 126 lb (57.2 kg)   SpO2 99%   BMI 20.97 kg/m  BP Readings from Last 3 Encounters:  07/05/24 130/78  06/30/24 (!) 145/86  03/16/24 119/73   Wt Readings from Last 3 Encounters:  07/05/24 126 lb (57.2 kg)  06/30/24 130 lb (59 kg)  01/28/24 136 lb 12.8 oz (62.1 kg)   Lab Results  Component Value Date  LDLCALC 65 01/19/2024     Physical Exam Vitals reviewed.  Constitutional:      Appearance: She is well-developed.  Eyes:     Conjunctiva/sclera: Conjunctivae normal.  Cardiovascular:     Rate and Rhythm: Normal rate and regular rhythm.     Pulses: Normal pulses.     Heart sounds: Normal heart sounds.  Pulmonary:     Effort: Pulmonary effort is normal.     Breath sounds: Normal breath sounds. No wheezing, rhonchi or rales.  Musculoskeletal:     Right lower leg: No edema.     Left lower leg: No edema.  Skin:    General: Skin is warm and dry.  Neurological:     Mental Status: She is alert.  Psychiatric:        Speech: Speech normal.        Behavior: Behavior normal.        Thought Content: Thought content normal.

## 2024-07-06 ENCOUNTER — Ambulatory Visit: Payer: Self-pay | Admitting: Family

## 2024-07-06 ENCOUNTER — Encounter: Payer: Self-pay | Admitting: Family

## 2024-07-06 NOTE — Assessment & Plan Note (Signed)
 She is not orthostatic on exam. BP increased as she went from supine to standing. HR however remained on low end of normal, 60-65.  Concern metoprolol  contributing to fatigue.  Decrease metoprolol  from 25mg  twice daily to 12.5mg  twice daily. Increase amlodipine  from 5mg  daily to 7.5mg  daily to maintain blood pressure control. Close follow up.

## 2024-07-08 NOTE — Assessment & Plan Note (Signed)
 Chronic, symptomatically stable.  No return to follow-up with cardiology.  LDL less than 70.  Continue aspirin  81 mg, Lipitor 20 mg.  Reiterated the importance of follow-up with cardiology.  She will call to schedule

## 2024-07-08 NOTE — Assessment & Plan Note (Addendum)
 Chronic, stable.  Reviewed neurology consult.  Continue to follow with [facility] neurology for ongoing surveillance.

## 2024-07-08 NOTE — Assessment & Plan Note (Signed)
 Chronic, uncontrolled.  Discussed work stress.  Recently has quit her job.  Start Remeron  15mg .  Close follow-up

## 2024-07-14 ENCOUNTER — Encounter: Payer: Self-pay | Admitting: Family

## 2024-07-14 ENCOUNTER — Ambulatory Visit (INDEPENDENT_AMBULATORY_CARE_PROVIDER_SITE_OTHER): Admitting: Family

## 2024-07-14 VITALS — BP 136/80 | HR 78 | Temp 98.0°F | Ht 67.0 in | Wt 128.0 lb

## 2024-07-14 DIAGNOSIS — R7303 Prediabetes: Secondary | ICD-10-CM

## 2024-07-14 DIAGNOSIS — I10 Essential (primary) hypertension: Secondary | ICD-10-CM | POA: Diagnosis not present

## 2024-07-14 DIAGNOSIS — R899 Unspecified abnormal finding in specimens from other organs, systems and tissues: Secondary | ICD-10-CM | POA: Diagnosis not present

## 2024-07-14 DIAGNOSIS — F32A Depression, unspecified: Secondary | ICD-10-CM

## 2024-07-14 NOTE — Assessment & Plan Note (Signed)
 Chronic, stable. Continue metoprolol   12.5mg  twice daily,amlodipine  7.5mg . Dizziness resolved.

## 2024-07-14 NOTE — Assessment & Plan Note (Addendum)
 Chronic, significant improvement. Continue remeron  15mg  qd

## 2024-07-14 NOTE — Progress Notes (Signed)
 Assessment & Plan:  Abnormal laboratory test -     B12 and Folate Panel; Future  Prediabetes -     Hemoglobin A1c; Future  Primary hypertension Assessment & Plan: Chronic, stable. Continue metoprolol   12.5mg  twice daily,amlodipine  7.5mg . Dizziness resolved.    Depression, unspecified depression type Assessment & Plan: Chronic, significant improvement. Continue remeron  15mg  qd      Return precautions given.   Risks, benefits, and alternatives of the medications and treatment plan prescribed today were discussed, and patient expressed understanding.   Education regarding symptom management and diagnosis given to patient on AVS either electronically or printed.  Return in about 6 months (around 01/12/2025).  [name], FNP  Subjective:    Patient ID: Brooke Murphy, female    DOB: Mar 29, 1961, 63 y.o.   MRN: [id number]  CC: Brooke Murphy is a 63 y.o. female who presents today for follow up.   HPI: HPI Discussed the use of AI scribe software for clinical note transcription with the patient, who gave verbal consent to proceed.  History of Present Illness   Brooke Murphy is a 64 year old female who presents for a follow-up visit after medication changes.  She feels significantly better and is 'getting back to herself' following recent medication adjustments.  She had difficulty sleeping but notes improvement since resuming Remeron  (mirtazapine ) at 15 mg. Initially, she experienced dizziness in the mornings, but this has resolved, and she is now sleeping better and has increased energy.  Her eating habits have improved, and she has gained a pound, which she is pleased about. Her metoprolol  dosage was decreased from 25 mg twice daily to 12.5 mg twice daily.She no longer experiences dizziness.  She is not currently taking B12 supplements, although her B12 levels were previously high. She drinks water with electrolytes, specifically a brand called Core, twice a week   Started  remeron  ; decrease metoprolol  to 12.5mg  BID at last visit.  Allergies: Celebrex [celecoxib], Lodine [etodolac], and Penicillins Current Outpatient Medications on File Prior to Visit  Medication Sig Dispense Refill   acetaminophen  (TYLENOL ) 500 MG tablet Take 1,000 mg by mouth every 8 (eight) hours as needed.     albuterol  (VENTOLIN  HFA) 108 (90 Base) MCG/ACT inhaler Inhale 2 puffs into the lungs every 6 (six) hours as needed for wheezing or shortness of breath (or cough). 18 each 2   amLODipine  (NORVASC ) 2.5 MG tablet Take 1 tablet (2.5 mg total) by mouth daily. Take with amlodipine  5mg  30 tablet 2   amLODipine  (NORVASC ) 5 MG tablet TAKE 1 TABLET (5 MG TOTAL) BY MOUTH DAILY. 90 tablet 3   aspirin  EC 81 MG tablet Take 1 tablet (81 mg total) by mouth daily. Swallow whole. 90 tablet 3   atorvastatin  (LIPITOR) 20 MG tablet TAKE 1 TABLET BY MOUTH EVERY DAY 90 tablet 3   fluticasone  (FLONASE ) 50 MCG/ACT nasal spray Place 2 sprays into both nostrils daily. 16 g 6   ipratropium-albuterol  (DUONEB) 0.5-2.5 (3) MG/3ML SOLN Take 3 mLs by nebulization every 4 (four) hours as needed. 360 mL 1   losartan  (COZAAR ) 100 MG tablet TAKE 1 TABLET BY MOUTH EVERY DAY 90 tablet 3   metoprolol  tartrate (LOPRESSOR ) 25 MG tablet Take 0.5 tablets (12.5 mg total) by mouth 2 (two) times daily. 180 tablet 3   mirtazapine  (REMERON ) 15 MG tablet Take 1 tablet (15 mg total) by mouth at bedtime. 30 tablet 2   omeprazole  (PRILOSEC) 20 MG capsule TAKE 1 CAPSULE BY MOUTH EVERY DAY  90 capsule 3   Tiotropium Bromide  Monohydrate (SPIRIVA  RESPIMAT) 2.5 MCG/ACT AERS Inhale 2 puffs into the lungs daily. 4 g 5   No current facility-administered medications on file prior to visit.    Review of Systems  Constitutional:  Negative for chills and fever.  Respiratory:  Negative for cough.   Cardiovascular:  Negative for chest pain and palpitations.  Gastrointestinal:  Negative for nausea and vomiting.      Objective:    BP 136/80    Pulse 78   Temp 98 F (36.7 C) (Oral)   Ht 5' 7 (1.702 m)   Wt 128 lb (58.1 kg)   SpO2 96%   BMI 20.05 kg/m  BP Readings from Last 3 Encounters:  07/14/24 136/80  07/05/24 130/78  06/30/24 (!) 145/86   Wt Readings from Last 3 Encounters:  07/14/24 128 lb (58.1 kg)  07/05/24 126 lb (57.2 kg)  06/30/24 130 lb (59 kg)    Physical Exam Vitals reviewed.  Constitutional:      Appearance: She is well-developed.  Eyes:     Conjunctiva/sclera: Conjunctivae normal.  Cardiovascular:     Rate and Rhythm: Normal rate and regular rhythm.     Pulses: Normal pulses.     Heart sounds: Normal heart sounds.  Pulmonary:     Effort: Pulmonary effort is normal.     Breath sounds: Normal breath sounds. No wheezing, rhonchi or rales.  Skin:    General: Skin is warm and dry.  Neurological:     Mental Status: She is alert.  Psychiatric:        Speech: Speech normal.        Behavior: Behavior normal.        Thought Content: Thought content normal.

## 2024-07-14 NOTE — Patient Instructions (Signed)
 So so grateful that you are feeling more like you.  Nice to see you today.

## 2024-07-23 ENCOUNTER — Ambulatory Visit: Admitting: Pulmonary Disease

## 2024-07-27 ENCOUNTER — Other Ambulatory Visit: Payer: Self-pay | Admitting: Family

## 2024-07-27 DIAGNOSIS — F32A Depression, unspecified: Secondary | ICD-10-CM

## 2024-07-28 ENCOUNTER — Other Ambulatory Visit

## 2024-07-30 ENCOUNTER — Ambulatory Visit: Admitting: Pulmonary Disease

## 2024-08-17 ENCOUNTER — Ambulatory Visit: Admitting: Pulmonary Disease

## 2024-08-24 ENCOUNTER — Encounter: Payer: Self-pay | Admitting: Family

## 2024-08-24 ENCOUNTER — Other Ambulatory Visit: Payer: Self-pay | Admitting: Family

## 2024-08-25 NOTE — Telephone Encounter (Signed)
 PT HAS BEEN SCHEDULED FOR 08/30/24

## 2024-08-27 ENCOUNTER — Encounter: Payer: Self-pay | Admitting: Family

## 2024-08-27 ENCOUNTER — Ambulatory Visit: Admitting: Family

## 2024-08-27 VITALS — BP 128/78 | HR 79 | Temp 97.5°F | Wt 127.8 lb

## 2024-08-27 DIAGNOSIS — R634 Abnormal weight loss: Secondary | ICD-10-CM | POA: Insufficient documentation

## 2024-08-27 LAB — COMPREHENSIVE METABOLIC PANEL WITH GFR
ALT: 9 U/L (ref 0–35)
AST: 17 U/L (ref 0–37)
Albumin: 4.4 g/dL (ref 3.5–5.2)
Alkaline Phosphatase: 64 U/L (ref 39–117)
BUN: 15 mg/dL (ref 6–23)
CO2: 27 meq/L (ref 19–32)
Calcium: 9.4 mg/dL (ref 8.4–10.5)
Chloride: 100 meq/L (ref 96–112)
Creatinine, Ser: 0.81 mg/dL (ref 0.40–1.20)
GFR: 76.35 mL/min (ref >60.00)
Glucose, Bld: 107 mg/dL — ABNORMAL HIGH (ref 70–99)
Potassium: 3.5 meq/L (ref 3.5–5.1)
Sodium: 136 meq/L (ref 135–145)
Total Bilirubin: 0.5 mg/dL (ref 0.2–1.2)
Total Protein: 6.8 g/dL (ref 6.0–8.3)

## 2024-08-27 LAB — CBC WITH DIFFERENTIAL/PLATELET
Basophils Absolute: 0 K/uL (ref 0.0–0.1)
Basophils Relative: 0.6 % (ref 0.0–3.0)
Eosinophils Absolute: 0.3 K/uL (ref 0.0–0.7)
Eosinophils Relative: 4.3 % (ref 0.0–5.0)
HCT: 38.5 % (ref 36.0–46.0)
Hemoglobin: 12.8 g/dL (ref 12.0–15.0)
Lymphocytes Relative: 46.8 % — ABNORMAL HIGH (ref 12.0–46.0)
Lymphs Abs: 3 K/uL (ref 0.7–4.0)
MCHC: 33.2 g/dL (ref 30.0–36.0)
MCV: 95.3 fl (ref 78.0–100.0)
Monocytes Absolute: 0.7 K/uL (ref 0.1–1.0)
Monocytes Relative: 10.3 % (ref 3.0–12.0)
Neutro Abs: 2.5 K/uL (ref 1.4–7.7)
Neutrophils Relative %: 38 % — ABNORMAL LOW (ref 43.0–77.0)
Platelets: 319 K/uL (ref 150.0–400.0)
RBC: 4.04 Mil/uL (ref 3.87–5.11)
RDW: 14.7 % (ref 11.5–15.5)
WBC: 6.5 K/uL (ref 4.0–10.5)

## 2024-08-27 LAB — TSH: TSH: 0.68 u[IU]/mL (ref 0.35–5.50)

## 2024-08-27 LAB — T3, FREE: T3, Free: 3.4 pg/mL (ref 2.3–4.2)

## 2024-08-27 LAB — T4, FREE: Free T4: 0.69 ng/dL (ref 0.60–1.60)

## 2024-08-27 NOTE — Assessment & Plan Note (Signed)
 Unintentional weight loss despite normal appetite.  No other B symptoms at this time.  Pending CT chest abdomen and pelvis.  Discussed at length that I suspect hyperthyroidism is playing a role based on recent TSH in October.  Repeating labs today.  She will keep follow-up Dr Damian

## 2024-08-27 NOTE — Patient Instructions (Addendum)
 Trial stop for 2 weeks of liptor to see if bodyaches improve  I have ordered CT chest, abdomen and pelvis to rule out malignancy in the setting the weight loss.   However we are pending thyroid  labs and I certainly question if weight loss related to hyperthyroidism  Please keep appointment with Dr Damian

## 2024-08-27 NOTE — Progress Notes (Unsigned)
 Assessment & Plan:  Unintentional weight loss Assessment & Plan: Unintentional weight loss despite normal appetite.  No other B symptoms at this time.  Pending CT chest abdomen and pelvis.  Discussed at length that I suspect hyperthyroidism is playing a role based on recent TSH in October.  Repeating labs today.  She will keep follow-up Dr Damian  Orders: -     TSH -     CBC with Differential/Platelet -     Comprehensive metabolic panel with GFR -     T3, free -     T4, free -     CT CHEST ABDOMEN PELVIS W CONTRAST; Future     Return precautions given.   Risks, benefits, and alternatives of the medications and treatment plan prescribed today were discussed, and patient expressed understanding.   Education regarding symptom management and diagnosis given to patient on AVS either electronically or printed.  No follow-ups on file.  [name], FNP  Subjective:    Patient ID: Brooke Murphy, female    DOB: 10-14-1960, 63 y.o.   MRN: [id number]  CC: Brooke Murphy is a 63 y.o. female who presents today for an acute visit.    HPI: HPI Discussed the use of AI scribe software for clinical note transcription with the patient, who gave verbal consent to proceed.  History of Present Illness   Brooke Murphy is a 63 year old female with hyperthyroidism who presents with unexplained weight loss.  She has experienced significant weight loss from 140 pounds in April to 127 pounds currently, beginning around July or August, despite maintaining a good appetite and eating regularly. Her job at a [employer] involves some physical activity, but not enough to account for the weight loss. She describes her 'happy weight' as 135 pounds.  She is currently taking Remeron , which has helped with her appetite and sleep, but she experiences joint soreness, which she attributes to either Remeron  or Lipitor. She also reports muscle spasms in her legs, which she manages by walking them off. She stays  hydrated by drinking plain water.  She has a history of hyperthyroidism, with a recent TSH test in October indicating low levels.    Denies fever, chills, cp, night sweats, diarrhea, trouble swallowing, pain with swallowing, palpitations.   Episodic cough is at baseline. She has a h/o smoking.  Complaint with remeron  15mg  daily which has been helpful for depression. She notes increased appetite and eating more regular.   following with Dr Damian Last seen 06/28/2024 for multinodular goiter. Thyroid  ultrasound 10/2023 TSH 06/28/24 0.318 She is no longer on synthroid  Colonoscopy 03/16/2024, Dr. Aundria; significant for diverticula, internal hemorrhoids, 3 polyps  No h/o CKD  Last seen by pulmonology 01/28/2024 with Dr. Tamea for follow-up COPD, lung nodules.  No activity on PET/CT.  Allergies: Celebrex [celecoxib], Lodine [etodolac], and Penicillins Current Outpatient Medications on File Prior to Visit  Medication Sig Dispense Refill   acetaminophen  (TYLENOL ) 500 MG tablet Take 1,000 mg by mouth every 8 (eight) hours as needed.     albuterol  (VENTOLIN  HFA) 108 (90 Base) MCG/ACT inhaler Inhale 2 puffs into the lungs every 6 (six) hours as needed for wheezing or shortness of breath (or cough). 18 each 2   amLODipine  (NORVASC ) 2.5 MG tablet TAKE 1 TABLET (2.5 MG TOTAL) BY MOUTH DAILY. TAKE WITH AMLODIPINE  5MG  90 tablet 3   amLODipine  (NORVASC ) 5 MG tablet TAKE 1 TABLET (5 MG TOTAL) BY MOUTH DAILY. 90 tablet 3   aspirin   EC 81 MG tablet Take 1 tablet (81 mg total) by mouth daily. Swallow whole. 90 tablet 3   atorvastatin  (LIPITOR) 20 MG tablet TAKE 1 TABLET BY MOUTH EVERY DAY 90 tablet 3   fluticasone  (FLONASE ) 50 MCG/ACT nasal spray Place 2 sprays into both nostrils daily. 16 g 6   ipratropium-albuterol  (DUONEB) 0.5-2.5 (3) MG/3ML SOLN Take 3 mLs by nebulization every 4 (four) hours as needed. 360 mL 1   losartan  (COZAAR ) 100 MG tablet TAKE 1 TABLET BY MOUTH EVERY DAY 90 tablet 3    metoprolol  tartrate (LOPRESSOR ) 25 MG tablet Take 0.5 tablets (12.5 mg total) by mouth 2 (two) times daily. 180 tablet 3   mirtazapine  (REMERON ) 15 MG tablet TAKE 1 TABLET BY MOUTH EVERYDAY AT BEDTIME 90 tablet 3   omeprazole  (PRILOSEC) 20 MG capsule TAKE 1 CAPSULE BY MOUTH EVERY DAY 90 capsule 3   Tiotropium Bromide  Monohydrate (SPIRIVA  RESPIMAT) 2.5 MCG/ACT AERS Inhale 2 puffs into the lungs daily. 4 g 5   No current facility-administered medications on file prior to visit.    Review of Systems  Constitutional:  Positive for appetite change and unexpected weight change. Negative for activity change, chills and fever.  Respiratory:  Negative for cough.   Cardiovascular:  Negative for chest pain and palpitations.  Gastrointestinal:  Negative for nausea and vomiting.      Objective:    BP 128/78   Pulse 79   Temp (!) 97.5 F (36.4 C) (Oral)   Wt 127 lb 12.8 oz (58 kg)   SpO2 99%   BMI 20.02 kg/m   BP Readings from Last 3 Encounters:  08/27/24 128/78  07/14/24 136/80  07/05/24 130/78   Wt Readings from Last 3 Encounters:  08/27/24 127 lb 12.8 oz (58 kg)  07/14/24 128 lb (58.1 kg)  07/05/24 126 lb (57.2 kg)  01/19/24 140lb 09/09/23 135 lbs  Physical Exam Vitals reviewed.  Constitutional:      Appearance: She is well-developed.  Eyes:     Conjunctiva/sclera: Conjunctivae normal.  Neck:     Thyroid : No thyroid  mass or thyromegaly.  Cardiovascular:     Rate and Rhythm: Normal rate and regular rhythm.     Pulses: Normal pulses.     Heart sounds: Normal heart sounds.  Pulmonary:     Effort: Pulmonary effort is normal.     Breath sounds: Normal breath sounds. No wheezing, rhonchi or rales.  Lymphadenopathy:     Head:     Right side of head: No submental, submandibular, tonsillar, preauricular, posterior auricular or occipital adenopathy.     Left side of head: No submental, submandibular, tonsillar, preauricular, posterior auricular or occipital adenopathy.      Cervical: No cervical adenopathy.  Skin:    General: Skin is warm and dry.  Neurological:     Mental Status: She is alert.  Psychiatric:        Speech: Speech normal.        Behavior: Behavior normal.        Thought Content: Thought content normal.

## 2024-08-30 ENCOUNTER — Ambulatory Visit: Admitting: Family

## 2024-08-31 ENCOUNTER — Ambulatory Visit: Payer: Self-pay | Admitting: Family

## 2024-08-31 ENCOUNTER — Encounter: Payer: Self-pay | Admitting: Family

## 2024-08-31 ENCOUNTER — Telehealth: Payer: Self-pay | Admitting: Family

## 2024-08-31 NOTE — Telephone Encounter (Signed)
 What is status of CT chest abdomen and pelvis being scheduled?  Please let me know when scheduled

## 2024-09-03 ENCOUNTER — Telehealth: Payer: Self-pay

## 2024-09-03 ENCOUNTER — Ambulatory Visit: Admitting: Family

## 2024-09-03 DIAGNOSIS — R899 Unspecified abnormal finding in specimens from other organs, systems and tissues: Secondary | ICD-10-CM

## 2024-09-03 NOTE — Telephone Encounter (Signed)
 Per PCP 2wk lab appt for repeat CBC with diff scheduled.

## 2024-09-10 ENCOUNTER — Ambulatory Visit
Admission: RE | Admit: 2024-09-10 | Discharge: 2024-09-10 | Disposition: A | Discharge status: Home / Self Care | Source: Ambulatory Visit | Attending: Family | Admitting: Family

## 2024-09-10 DIAGNOSIS — I700 Atherosclerosis of aorta: Secondary | ICD-10-CM | POA: Insufficient documentation

## 2024-09-10 DIAGNOSIS — R634 Abnormal weight loss: Secondary | ICD-10-CM | POA: Insufficient documentation

## 2024-09-10 DIAGNOSIS — J984 Other disorders of lung: Secondary | ICD-10-CM | POA: Diagnosis not present

## 2024-09-10 DIAGNOSIS — J439 Emphysema, unspecified: Secondary | ICD-10-CM | POA: Insufficient documentation

## 2024-09-10 MED ORDER — IOHEXOL 300 MG/ML  SOLN
85.0000 mL | Freq: Once | INTRAMUSCULAR | Status: AC | PRN
  Administered 2024-09-10: 85 mL via INTRAVENOUS

## 2024-09-17 NOTE — Addendum Note (Signed)
 Addended by: BRIEN SHARENE RAMAN on: 09/17/2024 12:57 PM   Modules accepted: Orders

## 2024-09-22 ENCOUNTER — Other Ambulatory Visit

## 2024-09-24 ENCOUNTER — Encounter: Payer: Self-pay | Admitting: Pulmonary Disease

## 2024-09-24 ENCOUNTER — Ambulatory Visit: Admitting: Pulmonary Disease

## 2024-09-24 VITALS — BP 98/66 | HR 67 | Temp 98.1°F | Ht 67.0 in | Wt 125.0 lb

## 2024-09-24 DIAGNOSIS — J439 Emphysema, unspecified: Secondary | ICD-10-CM | POA: Diagnosis not present

## 2024-09-24 DIAGNOSIS — R918 Other nonspecific abnormal finding of lung field: Secondary | ICD-10-CM | POA: Diagnosis not present

## 2024-09-24 DIAGNOSIS — Z87891 Personal history of nicotine dependence: Secondary | ICD-10-CM

## 2024-09-24 DIAGNOSIS — J449 Chronic obstructive pulmonary disease, unspecified: Secondary | ICD-10-CM

## 2024-09-24 MED ORDER — ALBUTEROL SULFATE HFA 108 (90 BASE) MCG/ACT IN AERS: 2.0000 | INHALATION_SPRAY | Freq: Four times a day (QID) | RESPIRATORY_TRACT | 2 refills | Status: AC | PRN

## 2024-09-24 NOTE — Progress Notes (Signed)
 "  Subjective:    Patient ID: Brooke Murphy, female    DOB: 24-May-1961, 64 y.o.   MRN: [id number]  Patient Care Team: Dineen Rollene MATSU, FNP as PCP - General (Family Medicine) Tamea Dedra CROME, MD as Consulting Physician (Pulmonary Disease)  Chief Complaint  Patient presents with   COPD    No breathing problems. Using Spiriva  daily.    Lung Mass    Ct scan done on 09/10/24.    BACKGROUND/INTERVAL:[name] is a 64 year old former smoker (quit July 2023, 37 PY) who presents for follow-up on COPD with significant emphysema on imaging. She also is being followed for a lung nodules seen on CT. For evaluation of a dominant pulmonary nodule she had a PET/CT performed on 21 March 2021 that did not show FDG avidity on the nodule in question.  She had a low-dose CT chest for lung cancer screening purposes performed on 29 March 2022 and this showed that the nodule of interest on the prior exam had decreased in volume and regressed to 6 mm in diameter.  Prior CT of the chest also showed significant emphysema.  She had PFTs on 22 Jan 2024.  She has not relapsed since quitting smoking.  No recent exacerbations.  Lung cancer screening CT performed 12 August 2023 showed persistence of pulmonary cyst in the posterior segment of the right upper lobe with enlarging nodular component.  She underwent PET/CT on 01 October 2023 showing maximum SUV of 1.9 on this lesion which could be inflammatory or due to low-grade adenocarcinoma.  There was another area of low-level activity maximum SUV of 1.8 on the left which was attributed to chronic scarring or atelectasis.  CT chest 14 January 2024 shows that the mural thickening along the lateral aspect of the left apical bulla is stable in thickness and configuration from prior and is considered now benign after PET/CT performed in January 2025 was negative.  All other nodules are stable.  She has significant emphysema.   We last saw her on 28 Jan 2024 at that time she was instructed to  continue Spiriva  continue nodule surveillance.  She had CT chest abdomen and pelvis on 19 December to assess for weight loss.  Her CT chest shows stable to decreased size of her previously not noted nodules.  HPI Discussed the use of AI scribe software for clinical note transcription with the patient, who gave verbal consent to proceed.  History of Present Illness   [name] is a 64 year old female with COPD on the basis of emphysema, who presents for follow-up of abnormal lung cancer screening CT.  She is being monitored after an abnormal lung cancer screening CT. Recent imaging, including a chest CT, showed no change in the areas of concern.  There is actually reduction in size of some of the nodules.  She experiences weight loss despite having a good appetite. She works third shift and has been on medication for an overactive thyroid , which was causing her metabolism to be high. She was prescribed a half milligram pill to suppress her thyroid  activity, and after nearly two weeks of treatment, she notices some improvement in her symptoms, including feeling less tired and having less brittle nails.  She uses Spiriva  for her COPD and requires an albuterol  refill. She uses albuterol  more frequently during cold weather, which exacerbates her coughing. She works at a [employer] and wears a mask to manage her asthma symptoms, which have been affected by a new system at her workplace  causing nasal dryness and ear discomfort. She does not receive flu shots.   She has not had any hemoptysis.  Weight is stabilizing with the management of her thyroid  disease.      Review of Systems A 10 point review of systems was performed and it is as noted above otherwise negative.   Patient Active Problem List   Diagnosis Date Noted   Unintentional weight loss 08/27/2024   Bilateral hand pain 01/19/2024   Colon cancer screening 01/19/2024   Urinary frequency 02/22/2023   Cerebral aneurysm without rupture  12/25/2022   Elevated vitamin B12 level 12/24/2022   Left facial numbness 12/23/2022   Cervical high risk HPV (human papillomavirus) test positive 12/06/2022   Skin rash 11/07/2020   Suprapubic pressure 10/09/2018   Prediabetes 10/05/2018   Adnexal mass 10/05/2018   Constipation 10/05/2018   Hypokalemia 09/23/2018   COPD with acute exacerbation (HCC) 09/23/2018   Aortic atherosclerosis 09/09/2018   Smoker 07/20/2018   Goiter 07/20/2018   Right arm pain 07/20/2018   Encounter for screening for malignant neoplasm of respiratory organs 07/20/2018   Headache 11/17/2017   Sinus pressure 11/17/2017   Hyperthyroidism 04/29/2017   Routine physical examination 02/12/2017   GERD (gastroesophageal reflux disease) 08/27/2016   HLD (hyperlipidemia) 08/27/2016   Depression 08/27/2016   Right shoulder pain 06/15/2015   Arthritis 04/08/2015   HTN (hypertension) 12/10/2014   Allergic rhinitis 12/10/2014   Thoracic back pain 12/10/2014    Social History   Tobacco Use   Smoking status: Former    Current packs/day: 0.00    Average packs/day: 1 pack/day for 37.0 years (37.0 ttl pk-yrs)    Types: Cigarettes    Start date: 03/1985    Quit date: 03/2022    Years since quitting: 2.5   Smokeless tobacco: Never  Substance Use Topics   Alcohol use: No    Alcohol/week: 0.0 standard drinks of alcohol    Allergies[1]  Active Medications[2]  Immunization History  Administered Date(s) Administered   PFIZER(Purple Top)SARS-COV-2 Vaccination 05/24/2020, 06/15/2020        Objective:     Vitals:   09/24/24 0909  BP: 98/66  Pulse: 67  Temp: 98.1 F (36.7 C)  Height: 5' 7 (1.702 m)  Weight: 125 lb (56.7 kg)  SpO2: 98%  TempSrc: Temporal  BMI (Calculated): 19.57     SpO2: 98 %  GENERAL: Well-developed well-nourished woman, no acute distress.  Fully ambulatory.  No conversational dyspnea. HEAD: Normocephalic, atraumatic.  EYES: Pupils equal, round, reactive to light.  No scleral  icterus.  MOUTH: Teeth show areas of chipping.  Oral mucosa moist.  No thrush. NECK: Supple. No thyromegaly. Trachea midline. No JVD.  No adenopathy. PULMONARY: Good air entry bilaterally.  No adventitious sounds. CARDIOVASCULAR: S1 and S2. Regular rate and rhythm.  No rubs, murmurs or gallops heard. ABDOMEN: Benign. MUSCULOSKELETAL: No joint deformity, no clubbing, no edema.  NEUROLOGIC: No focal deficit, no gait disturbance, speech is fluent. SKIN: Intact,warm,dry. PSYCH: Mood and behavior normal.   I independently reviewed CT chest from 19 December and showed the patient the films.      Assessment & Plan:     ICD-10-CM   1. Stage 2 moderate COPD by GOLD classification (HCC)  J44.9     2. Pulmonary emphysema (HCC)  J43.9     3. Lung nodules  R91.8 CT CHEST WO CONTRAST    4. Former heavy cigarette smoker (20-39 per day)  Z87.891       Orders  Placed This Encounter  Procedures   CT CHEST WO CONTRAST    [facility]    Standing Status:   Future    Expected Date:   03/24/2025    Expiration Date:   09/24/2025    Preferred imaging location?:   [facility]    Meds ordered this encounter  Medications   albuterol  (VENTOLIN  HFA) 108 (90 Base) MCG/ACT inhaler    Sig: Inhale 2 puffs into the lungs every 6 (six) hours as needed for wheezing or shortness of breath (or cough).    Dispense:  17 g    Refill:  2   Discussion:    Benign pulmonary nodule under surveillance The pulmonary nodule remains unchanged in size and shows no activity on PET scan, indicating a benign nature. It appears to be getting smaller, suggesting stability. - Ordered chest CT in six months to monitor the nodule - Continue annual surveillance if nodule remains stable  Chronic obstructive pulmonary disease with emphysema COPD with emphysema is well-managed with Spiriva . She reports increased use of albuterol , particularly during winter weather, and has implemented protective measures at  work to minimize exposure to cold air and potential irritants. - Continue Spiriva  for COPD management - Refilled albuterol  prescription - Advised on protective measures at work to minimize exposure to cold air and irritants     Advised if symptoms do not improve or worsen, to please contact office for sooner follow up or seek emergency care.    I spent 32 minutes of dedicated to the care of this patient on the date of this encounter to include pre-visit review of records, face-to-face time with the patient discussing conditions above, post visit ordering of testing, clinical documentation with the electronic health record, making appropriate referrals as documented, and communicating necessary findings to members of the patients care team.     C. Leita Sanders, MD Advanced Bronchoscopy PCCM [facility]    *This note was generated using voice recognition software/Dragon and/or AI transcription program.  Despite best efforts to proofread, errors can occur which can change the meaning. Any transcriptional errors that result from this process are unintentional and may not be fully corrected at the time of dictation.     [1]  Allergies Allergen Reactions   Celebrex [Celecoxib] Rash    Rash. Upset stomach   Lodine [Etodolac] Rash    Rash. Upset stomach   Penicillins Rash    Has patient had a PCN reaction causing immediate rash, facial/tongue/throat swelling, SOB or lightheadedness with hypotension: Yes Has patient had a PCN reaction causing severe rash involving mucus membranes or skin necrosis: No Has patient had a PCN reaction that required hospitalization: No Has patient had a PCN reaction occurring within the last 10 years: No If all of the above answers are NO, then may proceed with Cephalosporin use.  [2]  Current Meds  Medication Sig   acetaminophen  (TYLENOL ) 500 MG tablet Take 1,000 mg by mouth every 8 (eight) hours as needed.   amLODipine  (NORVASC ) 2.5  MG tablet TAKE 1 TABLET (2.5 MG TOTAL) BY MOUTH DAILY. TAKE WITH AMLODIPINE  5MG    amLODipine  (NORVASC ) 5 MG tablet TAKE 1 TABLET (5 MG TOTAL) BY MOUTH DAILY.   aspirin  EC 81 MG tablet Take 1 tablet (81 mg total) by mouth daily. Swallow whole.   fluticasone  (FLONASE ) 50 MCG/ACT nasal spray Place 2 sprays into both nostrils daily. (Patient taking differently: Place 2 sprays into both nostrils daily as needed for allergies.)  ipratropium-albuterol  (DUONEB) 0.5-2.5 (3) MG/3ML SOLN Take 3 mLs by nebulization every 4 (four) hours as needed.   losartan  (COZAAR ) 100 MG tablet TAKE 1 TABLET BY MOUTH EVERY DAY   methimazole (TAPAZOLE) 5 MG tablet Take 2.5 mg by mouth daily.   metoprolol  tartrate (LOPRESSOR ) 25 MG tablet Take 0.5 tablets (12.5 mg total) by mouth 2 (two) times daily.   mirtazapine  (REMERON ) 15 MG tablet TAKE 1 TABLET BY MOUTH EVERYDAY AT BEDTIME   omeprazole  (PRILOSEC) 20 MG capsule TAKE 1 CAPSULE BY MOUTH EVERY DAY   Tiotropium Bromide  Monohydrate (SPIRIVA  RESPIMAT) 2.5 MCG/ACT AERS Inhale 2 puffs into the lungs daily.   [DISCONTINUED] albuterol  (VENTOLIN  HFA) 108 (90 Base) MCG/ACT inhaler Inhale 2 puffs into the lungs every 6 (six) hours as needed for wheezing or shortness of breath (or cough).   "

## 2024-10-01 ENCOUNTER — Encounter: Admitting: Family

## 2024-10-01 ENCOUNTER — Encounter: Payer: Self-pay | Admitting: Family

## 2024-10-01 ENCOUNTER — Other Ambulatory Visit

## 2024-10-01 ENCOUNTER — Telehealth: Payer: Self-pay | Admitting: Family

## 2024-10-01 VITALS — BP 128/78 | HR 67 | Temp 98.1°F | Ht 67.0 in | Wt 127.0 lb

## 2024-10-01 DIAGNOSIS — R899 Unspecified abnormal finding in specimens from other organs, systems and tissues: Secondary | ICD-10-CM

## 2024-10-01 NOTE — Telephone Encounter (Signed)
"  LVM to call back to schedule appt  "

## 2024-10-01 NOTE — Telephone Encounter (Signed)
 I called patient as she had to leave her appointment today to make an appointment.   She is scheduled with endocrinology next month.  She is started metamizole.  She is gaining weight back.  She is feeling better.  Understands to have CT chest scheduled in 6 months after consult with Dr. Tamea Northern team Call pt  Sch f/u with me 3-61mos Order CBC with diff and sch in 2-3 weeks

## 2024-10-04 NOTE — Telephone Encounter (Signed)
 Pt has been scheduled for lab appt and next f/up appt per notes below

## 2024-10-08 NOTE — Progress Notes (Signed)
 This encounter was created in error - please disregard.

## 2024-10-12 ENCOUNTER — Ambulatory Visit: Admitting: Family

## 2024-10-15 ENCOUNTER — Other Ambulatory Visit

## 2024-10-24 ENCOUNTER — Other Ambulatory Visit: Payer: Self-pay | Admitting: Family

## 2024-10-24 DIAGNOSIS — F32A Depression, unspecified: Secondary | ICD-10-CM

## 2024-11-05 ENCOUNTER — Ambulatory Visit: Admitting: Pulmonary Disease

## 2025-01-03 ENCOUNTER — Ambulatory Visit: Admitting: Family

## 2025-01-12 ENCOUNTER — Ambulatory Visit: Admitting: Family

## 2025-03-18 ENCOUNTER — Ambulatory Visit: Admitting: Pulmonary Disease
